# Patient Record
Sex: Female | Born: 1965 | Race: Black or African American | Hispanic: No | Smoking: Never smoker
Health system: Southern US, Community
[De-identification: ages and names within clinical notes are randomized; demographics above are authoritative.]

## PROBLEM LIST (undated history)

## (undated) DIAGNOSIS — E1169 Type 2 diabetes mellitus with other specified complication: Secondary | ICD-10-CM

## (undated) DIAGNOSIS — I4891 Unspecified atrial fibrillation: Secondary | ICD-10-CM

## (undated) DIAGNOSIS — K5900 Constipation, unspecified: Secondary | ICD-10-CM

## (undated) DIAGNOSIS — Z6841 Body Mass Index (BMI) 40.0 and over, adult: Secondary | ICD-10-CM

## (undated) DIAGNOSIS — E039 Hypothyroidism, unspecified: Secondary | ICD-10-CM

## (undated) DIAGNOSIS — M159 Polyosteoarthritis, unspecified: Secondary | ICD-10-CM

## (undated) DIAGNOSIS — M51369 Other intervertebral disc degeneration, lumbar region without mention of lumbar back pain or lower extremity pain: Secondary | ICD-10-CM

## (undated) DIAGNOSIS — G4730 Sleep apnea, unspecified: Secondary | ICD-10-CM

## (undated) DIAGNOSIS — R600 Localized edema: Secondary | ICD-10-CM

## (undated) DIAGNOSIS — K219 Gastro-esophageal reflux disease without esophagitis: Secondary | ICD-10-CM

## (undated) DIAGNOSIS — E785 Hyperlipidemia, unspecified: Secondary | ICD-10-CM

## (undated) DIAGNOSIS — E669 Obesity, unspecified: Secondary | ICD-10-CM

## (undated) DIAGNOSIS — J45909 Unspecified asthma, uncomplicated: Secondary | ICD-10-CM

## (undated) DIAGNOSIS — I10 Essential (primary) hypertension: Secondary | ICD-10-CM

## (undated) DIAGNOSIS — E876 Hypokalemia: Secondary | ICD-10-CM

## (undated) DIAGNOSIS — M5136 Other intervertebral disc degeneration, lumbar region: Secondary | ICD-10-CM

## (undated) HISTORY — DX: Gastro-esophageal reflux disease without esophagitis: K21.9

## (undated) HISTORY — DX: Essential (primary) hypertension: I10

## (undated) HISTORY — DX: Constipation, unspecified: K59.00

## (undated) HISTORY — DX: Hypomagnesemia: E83.42

## (undated) HISTORY — DX: Type 2 diabetes mellitus with other specified complication: E11.69

## (undated) HISTORY — DX: Hypokalemia: E87.6

## (undated) HISTORY — PX: OTHER SURGICAL HISTORY: SHX169

## (undated) HISTORY — DX: Morbid (severe) obesity due to excess calories: E66.01

## (undated) HISTORY — PX: VEIN SURGERY: SHX48

## (undated) HISTORY — DX: Body Mass Index (BMI) 40.0 and over, adult: Z684

## (undated) HISTORY — DX: Polyosteoarthritis, unspecified: M15.9

## (undated) HISTORY — PX: ABDOMINAL HYSTERECTOMY: SHX81

## (undated) HISTORY — DX: Type 2 diabetes mellitus with other specified complication: E66.9

## (undated) HISTORY — DX: Localized edema: R60.0

---

## 1998-12-31 ENCOUNTER — Encounter: Admission: RE | Admit: 1998-12-31 | Discharge: 1998-12-31 | Payer: Self-pay | Admitting: Family Medicine

## 1999-01-26 ENCOUNTER — Encounter: Admission: RE | Admit: 1999-01-26 | Discharge: 1999-01-26 | Payer: Self-pay | Admitting: Family Medicine

## 1999-04-02 ENCOUNTER — Encounter: Admission: RE | Admit: 1999-04-02 | Discharge: 1999-04-02 | Payer: Self-pay | Admitting: Family Medicine

## 1999-06-09 ENCOUNTER — Encounter: Payer: Self-pay | Admitting: Emergency Medicine

## 1999-06-09 ENCOUNTER — Emergency Department (HOSPITAL_COMMUNITY): Admission: EM | Admit: 1999-06-09 | Discharge: 1999-06-09 | Payer: Self-pay | Admitting: Emergency Medicine

## 1999-06-11 ENCOUNTER — Encounter: Admission: RE | Admit: 1999-06-11 | Discharge: 1999-06-11 | Payer: Self-pay | Admitting: Sports Medicine

## 1999-07-12 ENCOUNTER — Encounter: Admission: RE | Admit: 1999-07-12 | Discharge: 1999-07-12 | Payer: Self-pay | Admitting: Sports Medicine

## 1999-08-22 ENCOUNTER — Emergency Department (HOSPITAL_COMMUNITY): Admission: EM | Admit: 1999-08-22 | Discharge: 1999-08-22 | Payer: Self-pay | Admitting: Emergency Medicine

## 1999-12-16 ENCOUNTER — Emergency Department (HOSPITAL_COMMUNITY): Admission: EM | Admit: 1999-12-16 | Discharge: 1999-12-16 | Payer: Self-pay | Admitting: Emergency Medicine

## 1999-12-16 ENCOUNTER — Encounter: Payer: Self-pay | Admitting: Emergency Medicine

## 2000-03-03 ENCOUNTER — Encounter: Admission: RE | Admit: 2000-03-03 | Discharge: 2000-03-03 | Payer: Self-pay | Admitting: Family Medicine

## 2000-04-05 ENCOUNTER — Encounter: Admission: RE | Admit: 2000-04-05 | Discharge: 2000-04-05 | Payer: Self-pay | Admitting: Family Medicine

## 2000-04-10 ENCOUNTER — Encounter: Admission: RE | Admit: 2000-04-10 | Discharge: 2000-04-10 | Payer: Self-pay | Admitting: Family Medicine

## 2000-04-12 ENCOUNTER — Encounter: Payer: Self-pay | Admitting: Family Medicine

## 2000-04-12 ENCOUNTER — Encounter: Admission: RE | Admit: 2000-04-12 | Discharge: 2000-04-12 | Payer: Self-pay | Admitting: Family Medicine

## 2000-12-21 ENCOUNTER — Encounter: Admission: RE | Admit: 2000-12-21 | Discharge: 2000-12-21 | Payer: Self-pay | Admitting: Family Medicine

## 2001-06-04 ENCOUNTER — Encounter: Admission: RE | Admit: 2001-06-04 | Discharge: 2001-06-04 | Payer: Self-pay | Admitting: Sports Medicine

## 2001-06-11 ENCOUNTER — Emergency Department (HOSPITAL_COMMUNITY): Admission: EM | Admit: 2001-06-11 | Discharge: 2001-06-11 | Payer: Self-pay | Admitting: *Deleted

## 2001-06-25 ENCOUNTER — Emergency Department (HOSPITAL_COMMUNITY): Admission: EM | Admit: 2001-06-25 | Discharge: 2001-06-25 | Payer: Self-pay | Admitting: Emergency Medicine

## 2002-04-04 ENCOUNTER — Emergency Department (HOSPITAL_COMMUNITY): Admission: EM | Admit: 2002-04-04 | Discharge: 2002-04-04 | Payer: Self-pay | Admitting: Internal Medicine

## 2002-05-29 ENCOUNTER — Emergency Department (HOSPITAL_COMMUNITY): Admission: EM | Admit: 2002-05-29 | Discharge: 2002-05-29 | Payer: Self-pay | Admitting: Emergency Medicine

## 2002-06-04 ENCOUNTER — Emergency Department (HOSPITAL_COMMUNITY): Admission: EM | Admit: 2002-06-04 | Discharge: 2002-06-04 | Payer: Self-pay | Admitting: Emergency Medicine

## 2002-06-04 ENCOUNTER — Encounter: Payer: Self-pay | Admitting: Emergency Medicine

## 2002-10-20 ENCOUNTER — Encounter: Payer: Self-pay | Admitting: Emergency Medicine

## 2002-10-20 ENCOUNTER — Emergency Department (HOSPITAL_COMMUNITY): Admission: EM | Admit: 2002-10-20 | Discharge: 2002-10-20 | Payer: Self-pay | Admitting: Emergency Medicine

## 2003-10-15 ENCOUNTER — Emergency Department (HOSPITAL_COMMUNITY): Admission: EM | Admit: 2003-10-15 | Discharge: 2003-10-15 | Payer: Self-pay | Admitting: Emergency Medicine

## 2003-10-16 ENCOUNTER — Inpatient Hospital Stay (HOSPITAL_COMMUNITY): Admission: AD | Admit: 2003-10-16 | Discharge: 2003-10-18 | Payer: Self-pay | Admitting: Cardiology

## 2004-01-27 ENCOUNTER — Ambulatory Visit: Payer: Self-pay | Admitting: Nurse Practitioner

## 2004-02-12 ENCOUNTER — Ambulatory Visit: Payer: Self-pay | Admitting: *Deleted

## 2004-02-12 ENCOUNTER — Ambulatory Visit: Payer: Self-pay | Admitting: Nurse Practitioner

## 2004-03-03 ENCOUNTER — Ambulatory Visit: Payer: Self-pay | Admitting: Family Medicine

## 2004-03-05 ENCOUNTER — Encounter: Admission: RE | Admit: 2004-03-05 | Discharge: 2004-04-05 | Payer: Self-pay | Admitting: Family Medicine

## 2004-03-18 ENCOUNTER — Ambulatory Visit: Payer: Self-pay | Admitting: Family Medicine

## 2004-03-23 ENCOUNTER — Emergency Department (HOSPITAL_COMMUNITY): Admission: EM | Admit: 2004-03-23 | Discharge: 2004-03-23 | Payer: Self-pay | Admitting: Emergency Medicine

## 2004-03-26 ENCOUNTER — Emergency Department (HOSPITAL_COMMUNITY): Admission: EM | Admit: 2004-03-26 | Discharge: 2004-03-26 | Payer: Self-pay | Admitting: Emergency Medicine

## 2004-04-09 ENCOUNTER — Ambulatory Visit (HOSPITAL_COMMUNITY): Admission: RE | Admit: 2004-04-09 | Discharge: 2004-04-09 | Payer: Self-pay | Admitting: Obstetrics

## 2004-04-14 ENCOUNTER — Encounter (INDEPENDENT_AMBULATORY_CARE_PROVIDER_SITE_OTHER): Payer: Self-pay | Admitting: *Deleted

## 2004-04-22 ENCOUNTER — Ambulatory Visit: Payer: Self-pay | Admitting: Family Medicine

## 2004-06-14 ENCOUNTER — Ambulatory Visit: Payer: Self-pay | Admitting: Family Medicine

## 2004-07-15 ENCOUNTER — Ambulatory Visit: Payer: Self-pay | Admitting: Family Medicine

## 2004-08-01 ENCOUNTER — Ambulatory Visit (HOSPITAL_BASED_OUTPATIENT_CLINIC_OR_DEPARTMENT_OTHER): Admission: RE | Admit: 2004-08-01 | Discharge: 2004-08-01 | Payer: Self-pay | Admitting: Family Medicine

## 2004-08-06 ENCOUNTER — Encounter: Admission: RE | Admit: 2004-08-06 | Discharge: 2004-11-04 | Payer: Self-pay | Admitting: Sports Medicine

## 2004-08-08 ENCOUNTER — Ambulatory Visit: Payer: Self-pay | Admitting: Internal Medicine

## 2004-08-31 ENCOUNTER — Encounter: Admission: RE | Admit: 2004-08-31 | Discharge: 2004-08-31 | Payer: Self-pay | Admitting: Sports Medicine

## 2004-08-31 ENCOUNTER — Ambulatory Visit: Payer: Self-pay | Admitting: Family Medicine

## 2004-09-12 ENCOUNTER — Emergency Department (HOSPITAL_COMMUNITY): Admission: EM | Admit: 2004-09-12 | Discharge: 2004-09-13 | Payer: Self-pay | Admitting: Emergency Medicine

## 2004-10-06 ENCOUNTER — Ambulatory Visit: Payer: Self-pay | Admitting: Sports Medicine

## 2004-10-15 ENCOUNTER — Ambulatory Visit: Payer: Self-pay | Admitting: Sports Medicine

## 2004-11-11 ENCOUNTER — Ambulatory Visit: Payer: Self-pay | Admitting: Family Medicine

## 2004-11-17 ENCOUNTER — Emergency Department (HOSPITAL_COMMUNITY): Admission: EM | Admit: 2004-11-17 | Discharge: 2004-11-17 | Payer: Self-pay | Admitting: Emergency Medicine

## 2004-11-19 ENCOUNTER — Ambulatory Visit: Payer: Self-pay | Admitting: Family Medicine

## 2004-12-10 ENCOUNTER — Ambulatory Visit: Payer: Self-pay | Admitting: Family Medicine

## 2004-12-14 ENCOUNTER — Ambulatory Visit: Payer: Self-pay | Admitting: Family Medicine

## 2004-12-23 ENCOUNTER — Ambulatory Visit: Payer: Self-pay | Admitting: Sports Medicine

## 2004-12-29 ENCOUNTER — Encounter: Admission: RE | Admit: 2004-12-29 | Discharge: 2005-02-07 | Payer: Self-pay | Admitting: Family Medicine

## 2005-02-10 ENCOUNTER — Ambulatory Visit: Payer: Self-pay | Admitting: Sports Medicine

## 2005-03-03 ENCOUNTER — Ambulatory Visit: Payer: Self-pay | Admitting: Family Medicine

## 2005-05-13 ENCOUNTER — Ambulatory Visit: Payer: Self-pay | Admitting: Sports Medicine

## 2005-06-24 ENCOUNTER — Encounter (INDEPENDENT_AMBULATORY_CARE_PROVIDER_SITE_OTHER): Payer: Self-pay | Admitting: Specialist

## 2005-06-24 ENCOUNTER — Ambulatory Visit (HOSPITAL_COMMUNITY): Admission: RE | Admit: 2005-06-24 | Discharge: 2005-06-24 | Payer: Self-pay | Admitting: Obstetrics & Gynecology

## 2005-07-20 ENCOUNTER — Ambulatory Visit: Payer: Self-pay | Admitting: Sports Medicine

## 2005-08-10 ENCOUNTER — Ambulatory Visit: Payer: Self-pay | Admitting: Family Medicine

## 2005-08-24 ENCOUNTER — Ambulatory Visit: Payer: Self-pay | Admitting: Family Medicine

## 2005-11-09 ENCOUNTER — Ambulatory Visit: Payer: Self-pay | Admitting: Family Medicine

## 2006-01-31 ENCOUNTER — Ambulatory Visit: Payer: Self-pay | Admitting: Family Medicine

## 2006-02-01 ENCOUNTER — Ambulatory Visit: Payer: Self-pay | Admitting: Family Medicine

## 2006-02-01 ENCOUNTER — Encounter: Admission: RE | Admit: 2006-02-01 | Discharge: 2006-02-01 | Payer: Self-pay | Admitting: Family Medicine

## 2006-03-24 ENCOUNTER — Ambulatory Visit (HOSPITAL_COMMUNITY): Admission: RE | Admit: 2006-03-24 | Discharge: 2006-03-24 | Payer: Self-pay | Admitting: Family Medicine

## 2006-03-24 ENCOUNTER — Ambulatory Visit: Payer: Self-pay | Admitting: Family Medicine

## 2006-03-31 ENCOUNTER — Ambulatory Visit: Payer: Self-pay | Admitting: Family Medicine

## 2006-04-12 ENCOUNTER — Encounter: Admission: RE | Admit: 2006-04-12 | Discharge: 2006-04-12 | Payer: Self-pay | Admitting: Family Medicine

## 2006-04-12 ENCOUNTER — Ambulatory Visit: Payer: Self-pay | Admitting: Family Medicine

## 2006-04-12 ENCOUNTER — Encounter: Payer: Self-pay | Admitting: Family Medicine

## 2006-04-12 LAB — CONVERTED CEMR LAB
BUN: 13 mg/dL (ref 6–23)
Chloride: 104 meq/L (ref 96–112)
Potassium: 4 meq/L (ref 3.5–5.3)

## 2006-05-05 ENCOUNTER — Ambulatory Visit: Payer: Self-pay | Admitting: Family Medicine

## 2006-05-12 ENCOUNTER — Encounter (INDEPENDENT_AMBULATORY_CARE_PROVIDER_SITE_OTHER): Payer: Self-pay | Admitting: *Deleted

## 2006-07-05 ENCOUNTER — Telehealth: Payer: Self-pay | Admitting: *Deleted

## 2006-07-05 ENCOUNTER — Ambulatory Visit: Payer: Self-pay | Admitting: Sports Medicine

## 2006-07-05 DIAGNOSIS — J309 Allergic rhinitis, unspecified: Secondary | ICD-10-CM

## 2006-07-05 DIAGNOSIS — M549 Dorsalgia, unspecified: Secondary | ICD-10-CM

## 2006-07-05 DIAGNOSIS — M545 Low back pain, unspecified: Secondary | ICD-10-CM

## 2006-07-05 DIAGNOSIS — R300 Dysuria: Secondary | ICD-10-CM

## 2006-07-05 HISTORY — DX: Dorsalgia, unspecified: M54.9

## 2006-07-05 HISTORY — DX: Dysuria: R30.0

## 2006-07-05 HISTORY — DX: Allergic rhinitis, unspecified: J30.9

## 2006-07-05 HISTORY — DX: Low back pain, unspecified: M54.50

## 2006-07-05 LAB — CONVERTED CEMR LAB
Blood in Urine, dipstick: NEGATIVE
Glucose, Urine, Semiquant: NEGATIVE
Ketones, urine, test strip: NEGATIVE
Specific Gravity, Urine: 1.02
WBC Urine, dipstick: NEGATIVE
pH: 7.5

## 2007-01-19 ENCOUNTER — Emergency Department (HOSPITAL_COMMUNITY): Admission: EM | Admit: 2007-01-19 | Discharge: 2007-01-19 | Payer: Self-pay | Admitting: Family Medicine

## 2007-08-18 ENCOUNTER — Emergency Department (HOSPITAL_COMMUNITY): Admission: EM | Admit: 2007-08-18 | Discharge: 2007-08-19 | Payer: Self-pay | Admitting: Emergency Medicine

## 2008-10-14 ENCOUNTER — Emergency Department (HOSPITAL_COMMUNITY): Admission: EM | Admit: 2008-10-14 | Discharge: 2008-10-14 | Payer: Self-pay | Admitting: Emergency Medicine

## 2008-11-24 ENCOUNTER — Emergency Department (HOSPITAL_COMMUNITY): Admission: EM | Admit: 2008-11-24 | Discharge: 2008-11-24 | Payer: Self-pay | Admitting: Emergency Medicine

## 2009-06-13 ENCOUNTER — Emergency Department (HOSPITAL_COMMUNITY): Admission: EM | Admit: 2009-06-13 | Discharge: 2009-06-14 | Payer: Self-pay | Admitting: Emergency Medicine

## 2009-08-09 ENCOUNTER — Emergency Department (HOSPITAL_COMMUNITY): Admission: EM | Admit: 2009-08-09 | Discharge: 2009-08-09 | Payer: Self-pay | Admitting: Emergency Medicine

## 2009-09-09 ENCOUNTER — Ambulatory Visit (HOSPITAL_COMMUNITY): Admission: RE | Admit: 2009-09-09 | Discharge: 2009-09-09 | Payer: Self-pay | Admitting: Obstetrics & Gynecology

## 2009-09-16 ENCOUNTER — Emergency Department (HOSPITAL_COMMUNITY): Admission: EM | Admit: 2009-09-16 | Discharge: 2009-09-16 | Payer: Self-pay | Admitting: Family Medicine

## 2009-10-08 ENCOUNTER — Emergency Department (HOSPITAL_BASED_OUTPATIENT_CLINIC_OR_DEPARTMENT_OTHER): Admission: EM | Admit: 2009-10-08 | Discharge: 2009-10-08 | Payer: Self-pay | Admitting: Emergency Medicine

## 2009-11-11 ENCOUNTER — Emergency Department (HOSPITAL_BASED_OUTPATIENT_CLINIC_OR_DEPARTMENT_OTHER): Admission: EM | Admit: 2009-11-11 | Discharge: 2009-11-11 | Payer: Self-pay | Admitting: Emergency Medicine

## 2009-11-12 ENCOUNTER — Encounter: Admission: RE | Admit: 2009-11-12 | Discharge: 2009-11-12 | Payer: Self-pay | Admitting: Geriatric Medicine

## 2010-04-04 ENCOUNTER — Encounter: Payer: Self-pay | Admitting: Obstetrics & Gynecology

## 2010-04-04 ENCOUNTER — Encounter: Payer: Self-pay | Admitting: Neurology

## 2010-05-28 LAB — URINALYSIS, ROUTINE W REFLEX MICROSCOPIC
Bilirubin Urine: NEGATIVE
Nitrite: NEGATIVE
Specific Gravity, Urine: 1.017 (ref 1.005–1.030)
Urobilinogen, UA: 1 mg/dL (ref 0.0–1.0)

## 2010-05-29 LAB — BASIC METABOLIC PANEL
Calcium: 8.4 mg/dL (ref 8.4–10.5)
GFR calc non Af Amer: >60 mL/min (ref >60)
Glucose, Bld: 233 mg/dL — ABNORMAL HIGH (ref 70–99)
Sodium: 142 mEq/L (ref 135–145)

## 2010-05-29 LAB — URINALYSIS, ROUTINE W REFLEX MICROSCOPIC
Bilirubin Urine: NEGATIVE
Ketones, ur: NEGATIVE mg/dL
Nitrite: NEGATIVE
Protein, ur: NEGATIVE mg/dL
Specific Gravity, Urine: 1.018 (ref 1.005–1.030)
Urobilinogen, UA: 1 mg/dL (ref 0.0–1.0)

## 2010-05-30 LAB — POCT URINALYSIS DIP (DEVICE)
Bilirubin Urine: NEGATIVE
Nitrite: NEGATIVE
Protein, ur: NEGATIVE mg/dL
Urobilinogen, UA: 0.2 mg/dL (ref 0.0–1.0)
pH: 7 (ref 5.0–8.0)

## 2010-05-31 LAB — RAPID URINE DRUG SCREEN, HOSP PERFORMED
Amphetamines: NOT DETECTED
Benzodiazepines: NOT DETECTED
Cocaine: NOT DETECTED
Tetrahydrocannabinol: NOT DETECTED

## 2010-05-31 LAB — DIFFERENTIAL
Basophils Absolute: 0 10*3/uL (ref 0.0–0.1)
Lymphocytes Relative: 31 % (ref 12–46)
Lymphs Abs: 2 10*3/uL (ref 0.7–4.0)
Neutrophils Relative %: 61 % (ref 43–77)

## 2010-05-31 LAB — URINALYSIS, ROUTINE W REFLEX MICROSCOPIC
Glucose, UA: 100 mg/dL — AB
Nitrite: NEGATIVE
Protein, ur: NEGATIVE mg/dL
Urobilinogen, UA: 0.2 mg/dL (ref 0.0–1.0)

## 2010-05-31 LAB — POCT I-STAT, CHEM 8
BUN: 12 mg/dL (ref 6–23)
Chloride: 107 mEq/L (ref 96–112)
HCT: 37 % (ref 36.0–46.0)
Sodium: 140 mEq/L (ref 135–145)
TCO2: 25 mmol/L (ref 0–100)

## 2010-05-31 LAB — BRAIN NATRIURETIC PEPTIDE: Pro B Natriuretic peptide (BNP): <30 pg/mL (ref 0.0–100.0)

## 2010-05-31 LAB — CBC
Platelets: 176 10*3/uL (ref 150–400)
WBC: 6.4 10*3/uL (ref 4.0–10.5)

## 2010-05-31 LAB — URINE CULTURE

## 2010-06-02 LAB — DIFFERENTIAL
Eosinophils Relative: 2 % (ref 0–5)
Lymphocytes Relative: 27 % (ref 12–46)
Lymphs Abs: 1.8 10*3/uL (ref 0.7–4.0)
Neutro Abs: 4.2 10*3/uL (ref 1.7–7.7)

## 2010-06-02 LAB — RAPID STREP SCREEN (MED CTR MEBANE ONLY): Streptococcus, Group A Screen (Direct): NEGATIVE

## 2010-06-02 LAB — POCT I-STAT, CHEM 8
BUN: 10 mg/dL (ref 6–23)
Calcium, Ion: 1.18 mmol/L (ref 1.12–1.32)
Chloride: 102 mEq/L (ref 96–112)
HCT: 37 % (ref 36.0–46.0)
Potassium: 4.3 mEq/L (ref 3.5–5.1)

## 2010-06-02 LAB — CBC
HCT: 35.8 % — ABNORMAL LOW (ref 36.0–46.0)
Hemoglobin: 11.9 g/dL — ABNORMAL LOW (ref 12.0–15.0)
Platelets: 149 10*3/uL — ABNORMAL LOW (ref 150–400)
WBC: 6.6 10*3/uL (ref 4.0–10.5)

## 2010-06-02 LAB — MONONUCLEOSIS SCREEN: Mono Screen: NEGATIVE

## 2010-06-18 LAB — GLUCOSE, CAPILLARY: Glucose-Capillary: 155 mg/dL — ABNORMAL HIGH (ref 70–99)

## 2010-07-30 NOTE — Op Note (Signed)
Tonya Quinn, MESSLER                          ACCOUNT NO.:  [id number]   MEDICAL RECORD NO.:  [id number]                   PATIENT TYPE:  INP   LOCATION:  3731                                 FACILITY:  [facility]   PHYSICIAN:  Arturo Morton. Riley Kill, M.D. [facility]         DATE OF BIRTH:  03-Dec-1965   DATE OF PROCEDURE:  DATE OF DISCHARGE:  10/18/2003                                 OPERATIVE REPORT   NO DICTATION     Maple Mirza, P.A.                    Arturo Morton. Riley Kill, M.D. [facility]    GM/MEDQ  D:  10/18/2003  T:  10/18/2003  Job:  [id number]

## 2010-07-30 NOTE — Discharge Summary (Signed)
Tonya Quinn                          ACCOUNT NO.:  [id number]   MEDICAL RECORD NO.:  [id number]                   PATIENT TYPE:  INP   LOCATION:  3731                                 FACILITY:  [facility]   PHYSICIAN:  Arturo Morton. Riley Kill, M.D. [facility]         DATE OF BIRTH:  07/07/1965   DATE OF ADMISSION:  10/16/2003  DATE OF DISCHARGE:  10/18/2003                                 DISCHARGE SUMMARY   DISCHARGE DIAGNOSES:  1. Admitted with fatigue, dyspnea, weakness, exercise intolerance, chest     pain (serial evaluations in the emergency room both at [facility] and     [facility] prior to being sent to [facility]).  2. Echocardiogram, at [facility], October 16, 2003, is normal.  3. Electrocardiogram, on admission, nondiagnostic, sinus rhythm.  4. Exercise Cardiolite, at [facility], negative for ischemia or     infarct.  Ejection fraction 79%.  5. Internal medicine evaluation, October 17, 2003, the physician advised     against the patient's continued use of Advil which she has been taking     lately liberally for pain.  6. Continue with Tylenol for pain control.  7. Prescribed Prilosec, possible gastritis which has been causing     abdominal/chest pain.  8. The patient's personal seems stressed, depressed.  9. Referral to [facility] to rule out autoimmune disorder, prescribe     for depression.   PROCEDURE:  October 17, 2003, exercise Cardiolite study.  The patient has poor  exercise tolerance.  Exercised for a total of 8 minutes 35 seconds.  Achieved 8.1 MEETS, stopped because the patient was lightheaded, had fatigue  and dyspnea.  No chest pain.  The study showed an ejection fraction of 79%.  No infarct.  No ischemia.   Post exercise Cardiolite study, day #1 - October 18, 2003, the patient seemed  depressed, fatigued, said she continued to have a back discomfort and  abdominal and chest discomfort.  She did say she had stress at her job,  been  working long hours for little pay.  Has heavy financial burden.  She says  she has had prior behavioral health admission and that she has a history of  panic attacks and feels edgy.  The slightest intrusion causes upset to her.  Apparently giving a picture of someone who is not able to cope.  Contacted  the teaching service who recommended that she call [facility] at [phone] on Monday.  The patient will discharge, on October 18, 2003, with  recommendation to stop using Advil for pain and start using Tylenol and to  obtain Prilosec over the counter 20 mg twice daily for ten days and then to  start Prilosec over the counter daily.   DISCHARGE DIET:  Low-sodium, low-cholesterol.   FOLLOW UP:  Follow up with [facility], Monday, October 20, 2003.   BRIEF HISTORY:  [name] is a 45 year old female.  She has no  primary care physician.  Recently seen at [facility] in  [place] as well as [facility] Emergency Room on several  occasions at each place for evaluation of fatigue, weakness, dyspnea on  exertion, back pain, and chest pain.  The patient says that she was told  that her symptoms are due to stress.  Apparently the patient was told at the  [facility] Emergency Room that she had a murmur, and they recommended that  she be evaluated by [facility] with an echocardiogram.  The patient  came to the office of [facility], on October 16, 2003, an  echocardiogram was performed which was normal.  She told the technician that  she was having chest pain at the time and arrangements were made to work her  into the schedule.  She states that her pain comes on with stress.  She  describes the pain as left of the sternum, radiating to the back.  Unable to  describe the quality of the pain.  Severity is 3-5 on a 1 to 10 scale and  lasts about 60 seconds at a time, sometimes associated with shortness of  breath, occasional nausea,  occasional weakness.  There are no diaphoresis  symptoms.  It resolves spontaneously.  Apparently she woke up, October 11, 2003, feeling bad.  She states her back started to hurt as soon as her feet  hit the floor on October 11, 2003.  She has been having intermittent chest pain  since that time as well as dyspnea on exertion.  She was seen in the office  by Dr. Arturo Morton. Stuckey.  The decision was made to admit the patient to  [facility] for further evaluation.   HOSPITAL COURSE:  The patient was admitted to [facility] on October 16, 2003.  She was seen, on October 17, 2003, with resolution of chest pain.  Cardiac enzymes were negative x 2.  She underwent an exercise Cardiolite  study which was negative.  She was seen by internal medicine.  They queried  her use of medications and found that she had been using NSAIDs which would  quite likely produce gastritis that could be masquerading as chest pain.  They recommended she stop using Advil, start using Tylenol, and to take  Prilosec for the next 6-8 weeks.  She also is to follow up at the medical  clinic and we heartily concur.  The patient is to apply to [facility] on Monday.  She goes home with the medications and  followup as dictated.      Tonya Quinn, P.A.                    Arturo Morton. Riley Kill, M.D. [facility]    GM/MEDQ  D:  10/18/2003  T:  10/18/2003  Job:  [id number]   cc:   Arturo Morton. Riley Kill, M.D. [facility]   [facility]

## 2010-07-30 NOTE — Op Note (Signed)
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   PROCEDURE:  Diagnostic dilatation and curettage, hysteroscopy with NovaSure  endometrial ablation.   IV FLUIDS:  As per anesthesiology.   URINE OUTPUT:  100 mL clear urine at the beginning of the procedure.   COMPLICATIONS:  None.   ESTIMATED BLOOD LOSS:  Minimal.   PROCEDURE:  The patient was taken to the operating room with an IV running.  She was placed in the dorsal lithotomy position and prepped and draped in  the usual sterile fashion.  Sims retractors were placed into the vagina.  The anterior lip of the cervix was then infiltrated with 1% lidocaine, 2 mL.  A single-tooth tenaculum was applied to this location.  4 mL of 1% lidocaine  were then injected at 5 and 7 o'clock to produce a paracervical block.  The  uterus was then sounded to 11 cm.  The length of the cervical canal was felt  to be 5.5 cm and this was determined by inserting a Hegar dilator to the  level of the internal os.  The cervix was then dilated with [facility] dilators.  The hysteroscope was then advanced into the uterine fundus.  A survey of the  intrauterine cavity was essentially normal.  Of note, there was a suggestion  of a small myoma in the anterior right fundal region.  A sharp curettage was  then performed.  The NovaSure apparatus was then advanced into the fundus.  The cavity integrity test was passed and the ablation cycle was started and  completed.  The apparatus was  then removed from the uterus.  The single  tooth tenaculum was then removed with minimal bleeding noted from the  cervix.  At the close of the procedure, the instrument and pack counts were  said to be correct x2.  The patient was taken to the PACU awake and in  stable condition.   PATHOLOGY:  Endometrial curettings.      Roseanna Rainbow, M.D.  Electronically Signed     LAJ/MEDQ  D:  06/24/2005  T:  06/24/2005  Job:  [id number]

## 2010-07-30 NOTE — H&P (Signed)
Tonya Quinn, Tonya Quinn                          ACCOUNT NO.:  [id number]   MEDICAL RECORD NO.:  [id number]                   PATIENT TYPE:  INP   LOCATION:  3740                                 FACILITY:  [facility]   PHYSICIAN:  Arturo Morton. Riley Kill, M.D. [facility]         DATE OF BIRTH:  1966/01/24   DATE OF ADMISSION:  10/16/2003  DATE OF DISCHARGE:                                HISTORY & PHYSICAL   CHIEF COMPLAINT:  Chest pain and dyspnea on exertion.   HISTORY OF PRESENT ILLNESS:  This is a 45 year old female who has no primary  care physician.  She was recently seen at the [facility] in [place],  [place], as well as the [facility]  Emergency Room on several occasions for evaluation of extreme fatigue,  weakness, dyspnea on exertion, back pain, and chest pain.  The patient tells  me that she was told that her symptoms were due to stress.  We do not have  the records from the hospital in [place], at this time.   Apparently the patient was told at the [facility] Emergency Room  that she had a murmur on exam and they recommended that she be evaluated by  [name] with an echocardiogram.  The patient came to our office  today to have an echocardiogram performed.  The echocardiogram was normal.  However, she told the technician that she was having chest pain and  arrangements were made to work her into the schedule.   The patient states that her pain comes on with stress.  She describes the  pain as to the left of her sternum and radiating through to her back.  She  is unable to describe the quality of the pain.  The severity is 3-5 on a 1-  10 scale and lasts approximately 60 seconds at a time.  Sometimes it is  associated with shortness of breath, occasional nausea, and occasional  weakness.  There is no diaphoresis.  The symptoms resolve on their own.  She  apparently woke up on October 11, 2003, feeling bad.  She  states her back  started to hurt as soon as her feet hit the floor.  She has been having  intermittent chest pain since that time, as well as dyspnea on exertion.  She was seen in our office today by Arturo Morton. Riley Kill, M.D., and a decision  was made to admit the patient for further evaluation.   PAST MEDICAL HISTORY:  Negative for diabetes.  Negative for hypertension.  The patient states that she has had her cholesterol checked in the distant  past and it was normal.  She has never been a smoker.  She does have a  remote history of asthma, however, this has not been a problem recently.  She is status post tubal ligation.  She is status post childbirth x 4.  She  has  a history of varicose vein surgery.   ALLERGIES:  GUAIFENEX causes her to have a headache.   CURRENT MEDICATIONS:  Ibuprofen p.r.n. only.   SOCIAL HISTORY:  The patient lives in [place], [place], with her  son.  She is single.  She has four children.  She has never used alcohol or  tobacco.  She works as a Education administrator with the mentally  challenged.  She has not been to work since October 11, 2003, due to the above  symptoms.   FAMILY HISTORY:  The patient's father is in his 50s.  He is alive and well.  Her mother died at an early age.  Apparently she was murdered.  She has two  brothers who are alive and well.   REVIEW OF SYSTEMS:  Negative, except for the following:  She has had the  above-noted weakness and fatigue.  She has had a 10-20-pound weight gain  over the past three months.  She has had frequent headaches.  She has  dyspnea on exertion.  She has had a cough with clear white sputum.  She has  had the above-noted chest pain.  She has mild ankle edema.  Although this is  a chronic problem, she states it has been worse over the past two months.  She has had some nausea and some diarrhea.  She has occasional joint pain.  She has had low back pain, cold intolerance, as well as hot flashes.   She  had a recent urinary tract infection, which she states has cleared up.  She  admits to mild depression and mild anxiety.   PHYSICAL EXAMINATION:  GENERAL APPEARANCE:  A pleasant, fatigued, 38-year-  old, black female in no acute distress.  VITAL SIGNS:  Blood pressure 111/73, pulse 73.  WEIGHT:  282 pounds.  HEENT:  Unremarkable.  NECK:  No bruits.  No jugular venous distention.  HEART:  Regular rate and rhythm without murmur.  LUNGS:  Clear.  ABDOMEN:  Obese, soft, and nontender.  EXTREMITIES:  Pulses intact without significant edema.  SKIN:  Warm and dry.  NEUROLOGIC:  Exam is grossly intact.   LABORATORY DATA:  An EKG shows normal sinus rhythm and rate 73 beats per  minute without ischemia.  A 2-D echocardiogram performed in the office today  was within normal limits.  The left ventricle was normal size with a normal  ejection fraction and no significant valvular abnormalities.   IMPRESSION:  1. Dyspnea on exertion and chest pain.  2. Anxiety and depression.  3. Obesity.  4. Normal 2-D echocardiogram performed today.  5. Status post tubal ligation.   PLAN:  After discussing the patient with Dr. Riley Kill, a decision was made to  admit the patient for further evaluation.  We planned to order a D-dimer, as  well as an exercise Cardiolite.  If the source of her problems is not found  with the above-noted studies, we will ask the teaching service to evaluate  this patient.  We have instructed her to find a primary care physician for  further evaluation of her multiple medical issues.      Delton See, P.A. [facility]                  Thomas D. Riley Kill, M.D. [facility]    DR/MEDQ  D:  10/16/2003  T:  10/16/2003  Job:  [id number]

## 2010-07-30 NOTE — Procedures (Signed)
Tonya Quinn, AMSTER                ACCOUNT NO.:  [id number]   MEDICAL RECORD NO.:  [id number]          PATIENT TYPE:  REC   LOCATION:  SLEEP CENTER                 FACILITY:  [facility]   PHYSICIAN:  Clinton D. Maple Hudson, M.D. DATE OF BIRTH:  1965-07-07   DATE OF STUDY:  08/06/2004                              NOCTURNAL POLYSOMNOGRAM   REFERRING PHYSICIAN:  Nani Gasser, MD   INDICATION FOR STUDY:  Hypersomnia with sleep apnea.   EPWORTH SLEEPINESS SCORE:  Epworth Sleepiness Score 18/24, BMI 44, weight  276 pounds.   SLEEP ARCHITECTURE:  Total sleep time 281 minutes with sleep efficiency 73%.  Stage I was 7%, stage 2 66%, stages 3 and 4 18%, REMARKABLE was 9% of total  sleep time. Sleep latency 22 minutes. REMARKABLE latency 167 minutes, awake  after sleep onset 84 minutes, arousal index 29.   RESPIRATORY DATA:  Respiratory disturbance index (RDI, AHI) 1.9 obstructive  events per hour which is within normal limits. There were 1 central apnea, 3  obstructive apneas, and 5 hypopnea's. Events were not positional. REM RDI  2.4.   OXYGEN DATA:  Moderate snoring with oxygen desaturations with a nadir of  91%. Mean oxygen saturation on room air through this study was 96%.   CARDIAC DATA:  Normal sinus rhythm.   MOVEMENT/PARASOMNIA:  Occasional leg jerk with arousal, insignificant. There  were frequent nonspecific EEG arousals.   IMPRESSION/RECOMMENDATION:  1.  Occasional sleep disorder breathing events, within normal frequency      limits with an RDI of 1.9 per hour.  2.  Nonspecific EEG arousals which may contribute to a sense of non      restorative sleep the next day, potentially treatable as insomnia.      Clinton D. Maple Hudson, M.D.  [employer]    CDY/MEDQ  D:  08/08/2004 14:25:21  T:  08/08/2004 15:12:24  Job:  [id number]

## 2010-12-09 LAB — URINALYSIS, ROUTINE W REFLEX MICROSCOPIC
Nitrite: NEGATIVE
Protein, ur: NEGATIVE
Specific Gravity, Urine: 1.028
Urobilinogen, UA: 1

## 2010-12-09 LAB — D-DIMER, QUANTITATIVE: D-Dimer, Quant: 0.41

## 2011-09-11 ENCOUNTER — Emergency Department (HOSPITAL_COMMUNITY): Payer: BC Managed Care – PPO

## 2011-09-11 ENCOUNTER — Encounter (HOSPITAL_COMMUNITY): Payer: Self-pay | Admitting: Emergency Medicine

## 2011-09-11 ENCOUNTER — Emergency Department (HOSPITAL_COMMUNITY)
Admission: EM | Admit: 2011-09-11 | Discharge: 2011-09-11 | Disposition: A | Discharge status: Home / Self Care | Payer: BC Managed Care – PPO | Attending: Emergency Medicine | Admitting: Emergency Medicine

## 2011-09-11 DIAGNOSIS — R109 Unspecified abdominal pain: Secondary | ICD-10-CM | POA: Insufficient documentation

## 2011-09-11 DIAGNOSIS — M545 Low back pain, unspecified: Secondary | ICD-10-CM | POA: Insufficient documentation

## 2011-09-11 DIAGNOSIS — I10 Essential (primary) hypertension: Secondary | ICD-10-CM | POA: Insufficient documentation

## 2011-09-11 DIAGNOSIS — N390 Urinary tract infection, site not specified: Secondary | ICD-10-CM | POA: Insufficient documentation

## 2011-09-11 DIAGNOSIS — D259 Leiomyoma of uterus, unspecified: Secondary | ICD-10-CM | POA: Insufficient documentation

## 2011-09-11 DIAGNOSIS — E119 Type 2 diabetes mellitus without complications: Secondary | ICD-10-CM | POA: Insufficient documentation

## 2011-09-11 HISTORY — DX: Essential (primary) hypertension: I10

## 2011-09-11 HISTORY — DX: Unspecified asthma, uncomplicated: J45.909

## 2011-09-11 LAB — URINE MICROSCOPIC-ADD ON

## 2011-09-11 LAB — URINALYSIS, ROUTINE W REFLEX MICROSCOPIC
Bilirubin Urine: NEGATIVE
Ketones, ur: NEGATIVE mg/dL
Nitrite: NEGATIVE
Protein, ur: 30 mg/dL — AB

## 2011-09-11 LAB — BASIC METABOLIC PANEL
BUN: 8 mg/dL (ref 6–23)
Calcium: 9.4 mg/dL (ref 8.4–10.5)
GFR calc Af Amer: >90 mL/min (ref >90)
GFR calc non Af Amer: >90 mL/min (ref >90)
Glucose, Bld: 178 mg/dL — ABNORMAL HIGH (ref 70–99)
Potassium: 3.5 mEq/L (ref 3.5–5.1)
Sodium: 137 mEq/L (ref 135–145)

## 2011-09-11 LAB — CBC
Hemoglobin: 12.2 g/dL (ref 12.0–15.0)
MCH: 27.9 pg (ref 26.0–34.0)
MCHC: 32.5 g/dL (ref 30.0–36.0)
RDW: 14 % (ref 11.5–15.5)

## 2011-09-11 MED ORDER — CYCLOBENZAPRINE HCL 5 MG PO TABS: 5.0000 mg | ORAL_TABLET | Freq: Three times a day (TID) | ORAL | Status: AC | PRN

## 2011-09-11 MED ORDER — OXYCODONE-ACETAMINOPHEN 5-325 MG PO TABS: 1.0000 | ORAL_TABLET | Freq: Four times a day (QID) | ORAL | Status: AC | PRN

## 2011-09-11 MED ORDER — MORPHINE SULFATE 4 MG/ML IJ SOLN
6.0000 mg | Freq: Once | INTRAMUSCULAR | Status: AC
  Administered 2011-09-11: 6 mg via INTRAMUSCULAR
  Filled 2011-09-11: qty 2

## 2011-09-11 MED ORDER — ONDANSETRON 4 MG PO TBDP
8.0000 mg | ORAL_TABLET | ORAL | Status: AC
  Administered 2011-09-11: 8 mg via ORAL
  Filled 2011-09-11: qty 2

## 2011-09-11 MED ORDER — CIPROFLOXACIN HCL 500 MG PO TABS
500.0000 mg | ORAL_TABLET | Freq: Once | ORAL | Status: AC
  Administered 2011-09-11: 500 mg via ORAL
  Filled 2011-09-11: qty 1

## 2011-09-11 MED ORDER — CIPROFLOXACIN HCL 500 MG PO TABS: 500.0000 mg | ORAL_TABLET | Freq: Two times a day (BID) | ORAL | Status: AC

## 2011-09-11 NOTE — Discharge Instructions (Signed)
Return to the ED with any concerns including vomiting and not able to keep down antibiotics or liquids, worsening pain, weakness in legs, fever/chills, decreased level of alertness/lethargy, or any other alarming symptoms

## 2011-09-11 NOTE — ED Provider Notes (Signed)
History     CSN: [id number]  Arrival date & time 09/11/11  1539   First MD Initiated Contact with Patient 09/11/11 1751      Chief Complaint  Patient presents with  . Flank Pain  . Abdominal Pain    (Consider location/radiation/quality/duration/timing/severity/associated sxs/prior treatment) HPI Pt presents with c/o right sided low back/flank pain.  She states pain radiates around right side.  Pain began this morning.  Worse with movement and palpation.  Mild nausea, no vomiting.  No fever/chills.  No falls or trauma.  No weakness of legs, no retention of urine or incontinence of bowel or bladder.  There are no other associated systemic symtpoms, there are no other alleviating or modifying factors.  Pt has not taken any meds for her pain.  No dysuria.  Past Medical History  Diagnosis Date  . Diabetes mellitus   . Hypertension   . Asthma     History reviewed. No pertinent past surgical history.  No family history on file.  History  Substance Use Topics  . Smoking status: Never Smoker   . Smokeless tobacco: Not on file  . Alcohol Use: No    OB History    Grav Para Term Preterm Abortions TAB SAB Ect Mult Living                  Review of Systems ROS reviewed and all otherwise negative except for mentioned in HPI  Allergies  Review of patient's allergies indicates no known allergies.  Home Medications   Current Outpatient Rx  Name Route Sig Dispense Refill  . ACETAMINOPHEN 500 MG PO TABS Oral Take 500-1,000 mg by mouth 2 (two) times daily.    Marland Kitchen LISINOPRIL 10 MG PO TABS Oral Take 10 mg by mouth daily.    Marland Kitchen METFORMIN HCL 500 MG PO TABS Oral Take 500 mg by mouth 2 (two) times daily.    Marland Kitchen CIPROFLOXACIN HCL 500 MG PO TABS Oral Take 1 tablet (500 mg total) by mouth every 12 (twelve) hours. 14 tablet 0  . CYCLOBENZAPRINE HCL 5 MG PO TABS Oral Take 1 tablet (5 mg total) by mouth 3 (three) times daily as needed for muscle spasms. 20 tablet 0  . OXYCODONE-ACETAMINOPHEN  5-325 MG PO TABS Oral Take 1-2 tablets by mouth every 6 (six) hours as needed for pain. 15 tablet 0    BP 143/92  Pulse 86  Temp 98.8 F (37.1 C) (Oral)  Resp 20  SpO2 97%  LMP 09/04/2011 Vitals reviewed Physical Exam Physical Examination: General appearance - alert, uncomfortable appearing, and in no distress Mental status - alert, oriented to person, place, and time Mouth - mucous membranes moist, pharynx normal without lesions Neck - supple, no significant adenopathy Chest - clear to auscultation, no wheezes, rales or rhonchi, symmetric air entry Heart - normal rate, regular rhythm, normal S1, S2, no murmurs, rubs, clicks or gallops Abdomen - soft, nontender, nondistended, no masses or organomegaly Back exam - no midline tenderness to palpation, mild right paraspinal tenderness/right CVA tenderness Neurological - alert, oriented, normal speech, strength 5/5 in extremities x 4, sensation intact, gait normal Musculoskeletal - no joint tenderness, deformity or swelling Extremities - peripheral pulses normal, no pedal edema, no clubbing or cyanosis Skin - normal coloration and turgor, no rashes  ED Course  Procedures (including critical care time)  Labs Reviewed  URINALYSIS, ROUTINE W REFLEX MICROSCOPIC - Abnormal; Notable for the following:    APPearance CLOUDY (*)     Glucose,  UA 100 (*)     Protein, ur 30 (*)     Leukocytes, UA TRACE (*)     All other components within normal limits  BASIC METABOLIC PANEL - Abnormal; Notable for the following:    Glucose, Bld 178 (*)     All other components within normal limits  URINE MICROSCOPIC-ADD ON - Abnormal; Notable for the following:    Squamous Epithelial / LPF FEW (*)     Bacteria, UA MANY (*)     Crystals CA OXALATE CRYSTALS (*)     All other components within normal limits  CBC  POCT PREGNANCY, URINE  URINE CULTURE   Ct Abdomen Pelvis Wo Contrast  09/11/2011  *RADIOLOGY REPORT*  Clinical Data: Right flank pain.  CT  ABDOMEN AND PELVIS WITHOUT CONTRAST  Technique:  Multidetector CT imaging of the abdomen and pelvis was performed following the standard protocol without intravenous contrast.  Comparison: None.  Findings: Mild hepatomegaly noted.  The spleen, pancreas, and adrenal glands appear normal.  The kidneys appear unremarkable, as do the proximal ureters. The gallbladder appears contracted but otherwise unremarkable.  Scattered vascular calcifications noted in the pelvis.  No ureteral calculus is observed.  Small retroperitoneal lymph nodes measure up to 0.9 cm in short axis, in the upper limits of normal.  Small pelvic lymph nodes are not pathologically enlarged by size criteria.  Fatty prominence of the ileocecal valve noted.  A structure believed represent the appendix extends above the right ovary and does not appear inflamed.  An exophytic mass in the left fundal region of the uterus measures 5.2 x 7.4 by 8.0 cm.  A subserosal fibroid was described in this vicinity on prior ultrasound although was smaller.  Uterine contour suggests additional uterine fibroids. The left ovary is adjacent to this presumed fibroid.  Urinary bladder unremarkable.  No free pelvic fluid noted.  IMPRESSION:  1.  Mild hepatomegaly.  A specific cause for the patient's right flank pain is not observed. 2.  Uterine fibroids.  A left fundal exophytic fibroid has increased in size compared to prior pelvic ultrasound of 2011.  Original Report Authenticated By: Dellia Cloud, M.D.     1. Low back pain   2. Urinary tract infection       MDM  CT scan without evidence of ureteral stone, pt with pain that is c/w musculoskeletal pain- no signs or symptoms of cauda equina, also urine c/w UTI.  Started on antibiotics, also treated with pain meds and muscle relaxant.  Discharged with strict return precautions.  She is agreeable with this plan.         Ethelda Chick, MD 09/12/11 2153

## 2011-09-11 NOTE — ED Notes (Signed)
R flank pain that radiates to R abd since 9:30am.   Reports mild nausea.

## 2011-09-13 LAB — URINE CULTURE: Colony Count: 80000

## 2012-02-20 ENCOUNTER — Encounter (HOSPITAL_COMMUNITY): Payer: Self-pay | Admitting: Pharmacist

## 2012-02-23 ENCOUNTER — Encounter (HOSPITAL_COMMUNITY)
Admission: RE | Admit: 2012-02-23 | Discharge: 2012-02-23 | Disposition: A | Discharge status: Home / Self Care | Payer: BC Managed Care – PPO | Source: Ambulatory Visit | Attending: Obstetrics and Gynecology | Admitting: Obstetrics and Gynecology

## 2012-02-23 ENCOUNTER — Inpatient Hospital Stay (HOSPITAL_COMMUNITY): Payer: BC Managed Care – PPO

## 2012-02-23 ENCOUNTER — Inpatient Hospital Stay (HOSPITAL_COMMUNITY)
Admission: AD | Admit: 2012-02-23 | Discharge: 2012-02-23 | Disposition: A | Discharge status: Home / Self Care | Payer: BC Managed Care – PPO | Source: Ambulatory Visit | Attending: Emergency Medicine | Admitting: Emergency Medicine

## 2012-02-23 DIAGNOSIS — R0989 Other specified symptoms and signs involving the circulatory and respiratory systems: Secondary | ICD-10-CM | POA: Insufficient documentation

## 2012-02-23 DIAGNOSIS — Z01818 Encounter for other preprocedural examination: Secondary | ICD-10-CM | POA: Insufficient documentation

## 2012-02-23 DIAGNOSIS — Z01812 Encounter for preprocedural laboratory examination: Secondary | ICD-10-CM | POA: Insufficient documentation

## 2012-02-23 DIAGNOSIS — J45909 Unspecified asthma, uncomplicated: Secondary | ICD-10-CM | POA: Insufficient documentation

## 2012-02-23 DIAGNOSIS — R0609 Other forms of dyspnea: Secondary | ICD-10-CM | POA: Insufficient documentation

## 2012-02-23 DIAGNOSIS — E119 Type 2 diabetes mellitus without complications: Secondary | ICD-10-CM | POA: Insufficient documentation

## 2012-02-23 DIAGNOSIS — Z79899 Other long term (current) drug therapy: Secondary | ICD-10-CM | POA: Insufficient documentation

## 2012-02-23 DIAGNOSIS — N898 Other specified noninflammatory disorders of vagina: Secondary | ICD-10-CM | POA: Insufficient documentation

## 2012-02-23 DIAGNOSIS — R079 Chest pain, unspecified: Secondary | ICD-10-CM | POA: Insufficient documentation

## 2012-02-23 DIAGNOSIS — I10 Essential (primary) hypertension: Secondary | ICD-10-CM | POA: Insufficient documentation

## 2012-02-23 DIAGNOSIS — N949 Unspecified condition associated with female genital organs and menstrual cycle: Secondary | ICD-10-CM | POA: Insufficient documentation

## 2012-02-23 LAB — CBC
HCT: 38.1 % (ref 36.0–46.0)
MCH: 28.6 pg (ref 26.0–34.0)
Platelets: 168 10*3/uL (ref 150–400)
Platelets: 174 10*3/uL (ref 150–400)
RBC: 4.3 MIL/uL (ref 3.87–5.11)
RDW: 13.6 % (ref 11.5–15.5)
WBC: 5.4 10*3/uL (ref 4.0–10.5)
WBC: 5.3 10*3/uL (ref 4.0–10.5)

## 2012-02-23 LAB — COMPREHENSIVE METABOLIC PANEL
AST: 14 U/L (ref 0–37)
Albumin: 3.8 g/dL (ref 3.5–5.2)
Alkaline Phosphatase: 98 U/L (ref 39–117)
BUN: 8 mg/dL (ref 6–23)
CO2: 27 mEq/L (ref 19–32)
Chloride: 98 mEq/L (ref 96–112)
Chloride: 100 mEq/L (ref 96–112)
Creatinine, Ser: 0.57 mg/dL (ref 0.50–1.10)
GFR calc non Af Amer: >90 mL/min (ref >90)
Potassium: 3.5 mEq/L (ref 3.5–5.1)
Total Bilirubin: 0.3 mg/dL (ref 0.3–1.2)
Total Bilirubin: 0.4 mg/dL (ref 0.3–1.2)

## 2012-02-23 LAB — TROPONIN I: Troponin I: <0.3 ng/mL (ref <0.30)

## 2012-02-23 NOTE — MAU Note (Signed)
Pt was being seen in pre-op began having chest pain while she was there.  Pt states she has hx of chest pain, has never had cardiac dx.  Pt was feeling SOB, pain is L side of chest, pt denies radiating pain to arms or neck, but does have upper back pain.

## 2012-02-23 NOTE — MAU Note (Signed)
[facility] here 

## 2012-02-23 NOTE — ED Provider Notes (Signed)
History     CSN: [id number]  Arrival date & time 02/23/12  1142   First MD Initiated Contact with Patient 02/23/12 1236      Chief Complaint  Patient presents with  . Chest Pain    (Consider location/radiation/quality/duration/timing/severity/associated sxs/prior treatment) HPI The patient presents from [facility] hospital where she was receiving preoperative clearance for a planned hysterectomy.  Today, approximately 2 hours ago she developed anterior chest discomfort.  She states the pain began after receiving some concerning news about a family member.  The pain was diffuse, anterior, nonradiating, nonexertional nonpleuritic.  She notes mild associated dyspnea.  Symptoms improved with nitroglycerin, aspirin on ED arrival the patient is entirely asymptomatic.  Simply requesting that we do not cancel her hysterectomy. She denies a history of heart disease, smoking. She does have a history of hypertension, as well as non-insulin-dependent diabetes.  I spoke with the care provider at [facility] hospital regarding the patient's presentation at that time.  Past Medical History  Diagnosis Date  . Diabetes mellitus   . Hypertension   . Asthma     No past surgical history on file.  No family history on file.  History  Substance Use Topics  . Smoking status: Never Smoker   . Smokeless tobacco: Not on file  . Alcohol Use: No    OB History    Grav Para Term Preterm Abortions TAB SAB Ect Mult Living                  Review of Systems  Constitutional:       Per HPI, otherwise negative  HENT:       Per HPI, otherwise negative  Eyes: Negative.   Respiratory:       Per HPI, otherwise negative  Cardiovascular:       Per HPI, otherwise negative  Gastrointestinal: Negative for vomiting.  Genitourinary: Positive for vaginal bleeding, vaginal pain and pelvic pain.  Musculoskeletal:       Per HPI, otherwise negative  Skin: Negative.   Neurological: Negative for syncope.     Allergies  Review of patient's allergies indicates no known allergies.  Home Medications   Current Outpatient Rx  Name  Route  Sig  Dispense  Refill  . ALPRAZOLAM 0.25 MG PO TABS   Oral   Take 0.25 mg by mouth at bedtime as needed. For anxiety         . AMOXICILLIN 500 MG PO CAPS   Oral   Take 1,000 mg by mouth 2 (two) times daily. Only took 1 tablet twice daily initially. Pt is finishing out treatment.         . BUDESONIDE-FORMOTEROL FUMARATE 160-4.5 MCG/ACT IN AERO   Inhalation   Inhale 2 puffs into the lungs 2 (two) times daily as needed. For shortness of breath         . VITAMIN D PO   Oral   Take 1 tablet by mouth daily.         . CYCLOBENZAPRINE HCL 10 MG PO TABS   Oral   Take 10 mg by mouth at bedtime as needed. For back spasm         . DICLOFENAC SODIUM 75 MG PO TBEC   Oral   Take 75 mg by mouth 2 (two) times daily as needed. For pain         . LISINOPRIL 20 MG PO TABS   Oral   Take 20 mg by mouth daily.         Marland Kitchen  METFORMIN HCL 500 MG PO TABS   Oral   Take 500 mg by mouth 2 (two) times daily.         Marland Kitchen NAPROXEN SODIUM 550 MG PO TABS   Oral   Take 550 mg by mouth 2 (two) times daily as needed. For pain. Does not take while taking diclofenac.         . NORETHINDRONE 0.35 MG PO TABS   Oral   Take 1 tablet by mouth daily.           BP 129/58  Pulse 78  Temp 98.5 F (36.9 C) (Oral)  Resp 20  SpO2 99%  LMP 02/16/2012  Physical Exam  Nursing note and vitals reviewed. Constitutional: She is oriented to person, place, and time. She appears well-developed and well-nourished. No distress.  HENT:  Head: Normocephalic and atraumatic.  Eyes: Conjunctivae normal and EOM are normal.  Cardiovascular: Normal rate and regular rhythm.   Pulmonary/Chest: Effort normal and breath sounds normal. No stridor. No respiratory distress.  Abdominal: She exhibits no distension.  Musculoskeletal: She exhibits no edema.  Neurological: She is alert  and oriented to person, place, and time. No cranial nerve deficit.  Skin: Skin is warm and dry.  Psychiatric: She has a normal mood and affect.    ED Course  Procedures (including critical care time)   Labs Reviewed  COMPREHENSIVE METABOLIC PANEL  CBC  TROPONIN I   No results found.   1. Chest pain     Cardiac 80 sinus rhythm normal Pulse ox 99% room air normal   Date: 02/23/2012  Rate: 77  Rhythm: normal sinus rhythm  QRS Axis: normal  Intervals: normal  ST/T Wave abnormalities: nonspecific T wave changes  Conduction Disutrbances:none  Narrative Interpretation:   Old EKG Reviewed: none available BORDERLINE  MDM  [facility], after she developed chest pain while in the preoperative clearance evaluation.  On arrival to the emergency department the patient is chest pain-free.  The patient's endorsement of symptoms began after receiving troubling news, her absence of risk factors beyond hypertension , diabetes is reassuring.  The patient's ECG was nonischemic, her labs reassuring, and after a discussion on the need for continued outpatient evaluation, the patient discharged.  Given that she already had a stress test, this episode likely represents stress event, with low suspicion for acute ongoing coronary ischemia.        Gerhard Munch, MD 02/23/12 [phone]

## 2012-02-23 NOTE — MAU Provider Note (Signed)
Pt was in pre-op with anesthesia and started having chest pain. Pt had abnormal EKG.  Pt's BP was 150/90 .  Anesthesiologist talked to [facility] ED physician (? Name) Will accept pt; pt will be transferred by [facility]

## 2012-02-23 NOTE — ED Notes (Signed)
Patient transported to X-ray 

## 2012-02-23 NOTE — ED Notes (Signed)
Pt reports chest pressure across anterior chest beginning around 11am this AM after receiving some bad news from family member. Pt states at the time she became SOB with the pressure but has now resolved. Pt received 2 nitro SL with total pain relief and 324mg  ASA. 20g RAC. CBG 188. Stress test approx 1 mo ago was normal.

## 2012-02-23 NOTE — Pre-Procedure Instructions (Signed)
Pt c/o chest pain/pressure during PAT appt. BP of 171/112. Report given to Brayton Caves, MD, no anes. Interview. Order given to send pt to [facility] ED for evaluation. Arrangements made by house coverage Florina Ou). Pt taken to MAU via wheelchair per instructions until arrival of [facility] for transport.

## 2012-02-24 MED FILL — Aspirin Chew Tab 81 MG: ORAL | Qty: 4 | Status: AC

## 2012-02-28 ENCOUNTER — Other Ambulatory Visit (HOSPITAL_COMMUNITY): Payer: BC Managed Care – PPO

## 2012-02-28 ENCOUNTER — Encounter (HOSPITAL_COMMUNITY)
Admission: RE | Admit: 2012-02-28 | Discharge: 2012-02-28 | Disposition: A | Discharge status: Home / Self Care | Payer: BC Managed Care – PPO | Source: Ambulatory Visit | Attending: Obstetrics and Gynecology | Admitting: Obstetrics and Gynecology

## 2012-02-28 ENCOUNTER — Encounter (HOSPITAL_COMMUNITY): Payer: Self-pay

## 2012-02-28 MED ORDER — CEFAZOLIN SODIUM 10 G IJ SOLR
3.0000 g | INTRAMUSCULAR | Status: AC
  Administered 2012-02-29: 3 g via INTRAVENOUS
  Filled 2012-02-28: qty 3000

## 2012-02-28 NOTE — Pre-Procedure Instructions (Signed)
Cardiac and ED info. From 02/23/12 reviewed and accepted by Dr. Arby Barrette. He will re-evaluate pt during repeat PAT if status changed. Otherwise patient is acceptable for surgery tomorrow.

## 2012-02-28 NOTE — Patient Instructions (Addendum)
20 Tonya Quinn  02/28/2012   Your procedure is scheduled on:  02/29/12  Enter through the Main Entrance of [facility] at 1130 AM.  Pick up the phone at the desk and dial [phone].   Call this number if you have problems the morning of surgery: [phone]   Remember:   Do not eat food:After Midnight.  Do not drink clear liquids: After Midnight.  Take these medicines the morning of surgery with A SIP OF WATER: Blood pressure medications, hold Metformin for 24hrs. Bring inhaler.   Do not wear jewelry, make-up or nail polish.  Do not wear lotions, powders, or perfumes. You may wear deodorant.  Do not shave 48 hours prior to surgery.  Do not bring valuables to the hospital.  Contacts, dentures or bridgework may not be worn into surgery.  Leave suitcase in the car. After surgery it may be brought to your room.  For patients admitted to the hospital, checkout time is 11:00 AM the day of discharge.   Patients discharged the day of surgery will not be allowed to drive home.  Name and phone number of your driver: NA  Special Instructions: Shower using CHG 2 nights before surgery and the night before surgery.  If you shower the day of surgery use CHG.  Use special wash - you have one bottle of CHG for all showers.  You should use approximately 1/3 of the bottle for each shower.   Please read over the following fact sheets that you were given: MRSA Information

## 2012-02-29 ENCOUNTER — Ambulatory Visit (HOSPITAL_COMMUNITY): Payer: BC Managed Care – PPO | Admitting: Anesthesiology

## 2012-02-29 ENCOUNTER — Encounter (HOSPITAL_COMMUNITY): Payer: Self-pay

## 2012-02-29 ENCOUNTER — Encounter (HOSPITAL_COMMUNITY)
Admission: RE | Disposition: A | Discharge status: Home / Self Care | Payer: Self-pay | Source: Ambulatory Visit | Attending: Obstetrics and Gynecology

## 2012-02-29 ENCOUNTER — Encounter (HOSPITAL_COMMUNITY): Payer: Self-pay | Admitting: Anesthesiology

## 2012-02-29 ENCOUNTER — Ambulatory Visit (HOSPITAL_COMMUNITY)
Admission: RE | Admit: 2012-02-29 | Discharge: 2012-03-01 | Disposition: A | Discharge status: Home / Self Care | Payer: BC Managed Care – PPO | Source: Ambulatory Visit | Attending: Obstetrics and Gynecology | Admitting: Obstetrics and Gynecology

## 2012-02-29 ENCOUNTER — Encounter (HOSPITAL_COMMUNITY): Payer: Self-pay | Admitting: *Deleted

## 2012-02-29 DIAGNOSIS — N946 Dysmenorrhea, unspecified: Secondary | ICD-10-CM | POA: Insufficient documentation

## 2012-02-29 DIAGNOSIS — D219 Benign neoplasm of connective and other soft tissue, unspecified: Secondary | ICD-10-CM | POA: Diagnosis present

## 2012-02-29 DIAGNOSIS — D252 Subserosal leiomyoma of uterus: Secondary | ICD-10-CM | POA: Insufficient documentation

## 2012-02-29 DIAGNOSIS — Z01818 Encounter for other preprocedural examination: Secondary | ICD-10-CM | POA: Insufficient documentation

## 2012-02-29 DIAGNOSIS — N949 Unspecified condition associated with female genital organs and menstrual cycle: Secondary | ICD-10-CM | POA: Insufficient documentation

## 2012-02-29 DIAGNOSIS — D251 Intramural leiomyoma of uterus: Secondary | ICD-10-CM | POA: Insufficient documentation

## 2012-02-29 DIAGNOSIS — Z01812 Encounter for preprocedural laboratory examination: Secondary | ICD-10-CM | POA: Insufficient documentation

## 2012-02-29 DIAGNOSIS — N920 Excessive and frequent menstruation with regular cycle: Secondary | ICD-10-CM | POA: Insufficient documentation

## 2012-02-29 HISTORY — PX: CYSTOSCOPY: SHX5120

## 2012-02-29 HISTORY — PX: SALPINGOOPHORECTOMY: SHX82

## 2012-02-29 HISTORY — PX: LAPAROSCOPIC ASSISTED VAGINAL HYSTERECTOMY: SHX5398

## 2012-02-29 LAB — GLUCOSE, CAPILLARY
Glucose-Capillary: 141 mg/dL — ABNORMAL HIGH (ref 70–99)
Glucose-Capillary: 149 mg/dL — ABNORMAL HIGH (ref 70–99)
Glucose-Capillary: 248 mg/dL — ABNORMAL HIGH (ref 70–99)
Glucose-Capillary: 268 mg/dL — ABNORMAL HIGH (ref 70–99)

## 2012-02-29 LAB — HCG, SERUM, QUALITATIVE: Preg, Serum: NEGATIVE

## 2012-02-29 SURGERY — HYSTERECTOMY, VAGINAL, LAPAROSCOPY-ASSISTED
Anesthesia: General | Site: Vagina | Wound class: Clean Contaminated

## 2012-02-29 MED ORDER — KETOROLAC TROMETHAMINE 30 MG/ML IJ SOLN: 30.0000 mg | Freq: Once | INTRAMUSCULAR | Status: DC

## 2012-02-29 MED ORDER — SODIUM CHLORIDE 0.9 % IJ SOLN
INTRAMUSCULAR | Status: DC | PRN
  Administered 2012-02-29: 3 mL

## 2012-02-29 MED ORDER — FENTANYL CITRATE 0.05 MG/ML IJ SOLN
25.0000 ug | INTRAMUSCULAR | Status: DC | PRN
  Administered 2012-02-29 (×2): 50 ug via INTRAVENOUS

## 2012-02-29 MED ORDER — GLYCOPYRROLATE 0.2 MG/ML IJ SOLN
INTRAMUSCULAR | Status: AC
  Filled 2012-02-29: qty 1

## 2012-02-29 MED ORDER — FENTANYL CITRATE 0.05 MG/ML IJ SOLN
INTRAMUSCULAR | Status: DC | PRN
  Administered 2012-02-29: 100 ug via INTRAVENOUS
  Administered 2012-02-29: 50 ug via INTRAVENOUS

## 2012-02-29 MED ORDER — PHENYLEPHRINE 40 MCG/ML (10ML) SYRINGE FOR IV PUSH (FOR BLOOD PRESSURE SUPPORT)
PREFILLED_SYRINGE | INTRAVENOUS | Status: AC
  Filled 2012-02-29: qty 5

## 2012-02-29 MED ORDER — PROPOFOL 10 MG/ML IV EMUL
INTRAVENOUS | Status: AC
  Filled 2012-02-29: qty 20

## 2012-02-29 MED ORDER — INDIGOTINDISULFONATE SODIUM 8 MG/ML IJ SOLN
INTRAMUSCULAR | Status: AC
  Filled 2012-02-29: qty 5

## 2012-02-29 MED ORDER — SIMETHICONE 80 MG PO CHEW: 80.0000 mg | CHEWABLE_TABLET | Freq: Four times a day (QID) | ORAL | Status: DC | PRN

## 2012-02-29 MED ORDER — NEOSTIGMINE METHYLSULFATE 1 MG/ML IJ SOLN
INTRAMUSCULAR | Status: AC
  Filled 2012-02-29: qty 1

## 2012-02-29 MED ORDER — PROPOFOL 10 MG/ML IV EMUL
INTRAVENOUS | Status: DC | PRN
  Administered 2012-02-29: 250 mg via INTRAVENOUS

## 2012-02-29 MED ORDER — VASOPRESSIN 20 UNIT/ML IJ SOLN
INTRAMUSCULAR | Status: AC
  Filled 2012-02-29: qty 1

## 2012-02-29 MED ORDER — ONDANSETRON HCL 4 MG/2ML IJ SOLN: 4.0000 mg | Freq: Four times a day (QID) | INTRAMUSCULAR | Status: DC | PRN

## 2012-02-29 MED ORDER — STERILE WATER FOR IRRIGATION IR SOLN
Status: DC | PRN
  Administered 2012-02-29: 1000 mL

## 2012-02-29 MED ORDER — ONDANSETRON HCL 4 MG PO TABS: 4.0000 mg | ORAL_TABLET | Freq: Four times a day (QID) | ORAL | Status: DC | PRN

## 2012-02-29 MED ORDER — LACTATED RINGERS IR SOLN
Status: DC | PRN
  Administered 2012-02-29: 3000 mL

## 2012-02-29 MED ORDER — ROCURONIUM BROMIDE 50 MG/5ML IV SOLN
INTRAVENOUS | Status: AC
  Filled 2012-02-29: qty 1

## 2012-02-29 MED ORDER — MIDAZOLAM HCL 2 MG/2ML IJ SOLN
INTRAMUSCULAR | Status: AC
  Filled 2012-02-29: qty 2

## 2012-02-29 MED ORDER — ALPRAZOLAM 0.25 MG PO TABS: 0.2500 mg | ORAL_TABLET | Freq: Every evening | ORAL | Status: DC | PRN

## 2012-02-29 MED ORDER — HYDROMORPHONE HCL PF 1 MG/ML IJ SOLN
INTRAMUSCULAR | Status: DC | PRN
  Administered 2012-02-29: 1 mg via INTRAVENOUS

## 2012-02-29 MED ORDER — GLYCOPYRROLATE 0.2 MG/ML IJ SOLN
INTRAMUSCULAR | Status: DC | PRN
  Administered 2012-02-29: 0.4 mg via INTRAVENOUS

## 2012-02-29 MED ORDER — KETOROLAC TROMETHAMINE 30 MG/ML IJ SOLN
30.0000 mg | Freq: Four times a day (QID) | INTRAMUSCULAR | Status: DC
  Administered 2012-02-29: 30 mg via INTRAVENOUS
  Filled 2012-02-29: qty 1

## 2012-02-29 MED ORDER — ONDANSETRON HCL 4 MG/2ML IJ SOLN
INTRAMUSCULAR | Status: DC | PRN
  Administered 2012-02-29: 4 mg via INTRAVENOUS

## 2012-02-29 MED ORDER — MIDAZOLAM HCL 5 MG/5ML IJ SOLN
INTRAMUSCULAR | Status: DC | PRN
  Administered 2012-02-29: 2 mg via INTRAVENOUS

## 2012-02-29 MED ORDER — LACTATED RINGERS IV SOLN
INTRAVENOUS | Status: DC
  Administered 2012-02-29 (×4): via INTRAVENOUS

## 2012-02-29 MED ORDER — GLYCOPYRROLATE 0.2 MG/ML IJ SOLN
INTRAMUSCULAR | Status: AC
  Filled 2012-02-29: qty 2

## 2012-02-29 MED ORDER — NEOSTIGMINE METHYLSULFATE 1 MG/ML IJ SOLN
INTRAMUSCULAR | Status: DC | PRN
  Administered 2012-02-29: 2 mg via INTRAVENOUS

## 2012-02-29 MED ORDER — MEPERIDINE HCL 25 MG/ML IJ SOLN: 6.2500 mg | INTRAMUSCULAR | Status: DC | PRN

## 2012-02-29 MED ORDER — DEXTROSE-NACL 5-0.45 % IV SOLN
INTRAVENOUS | Status: DC
  Administered 2012-02-29 – 2012-03-01 (×2): via INTRAVENOUS

## 2012-02-29 MED ORDER — BUPIVACAINE HCL (PF) 0.25 % IJ SOLN
INTRAMUSCULAR | Status: DC | PRN
  Administered 2012-02-29: 13 mL

## 2012-02-29 MED ORDER — ALBUTEROL SULFATE HFA 108 (90 BASE) MCG/ACT IN AERS
INHALATION_SPRAY | RESPIRATORY_TRACT | Status: DC | PRN
  Administered 2012-02-29: 2 via RESPIRATORY_TRACT

## 2012-02-29 MED ORDER — FENTANYL CITRATE 0.05 MG/ML IJ SOLN
INTRAMUSCULAR | Status: AC
  Filled 2012-02-29: qty 2

## 2012-02-29 MED ORDER — HYDROMORPHONE 0.3 MG/ML IV SOLN
INTRAVENOUS | Status: DC
  Administered 2012-02-29: 19:00:00 via INTRAVENOUS
  Administered 2012-02-29: 0.4 mg via INTRAVENOUS
  Administered 2012-03-01: 0.2 mg via INTRAVENOUS
  Filled 2012-02-29: qty 25

## 2012-02-29 MED ORDER — LIDOCAINE HCL (CARDIAC) 20 MG/ML IV SOLN
INTRAVENOUS | Status: AC
  Filled 2012-02-29: qty 5

## 2012-02-29 MED ORDER — KETOROLAC TROMETHAMINE 30 MG/ML IJ SOLN: 30.0000 mg | Freq: Four times a day (QID) | INTRAMUSCULAR | Status: DC

## 2012-02-29 MED ORDER — METFORMIN HCL 500 MG PO TABS
500.0000 mg | ORAL_TABLET | Freq: Two times a day (BID) | ORAL | Status: DC
  Administered 2012-02-29 – 2012-03-01 (×2): 500 mg via ORAL
  Filled 2012-02-29 (×4): qty 1

## 2012-02-29 MED ORDER — VASOPRESSIN 20 UNIT/ML IJ SOLN
INTRAVENOUS | Status: DC | PRN
  Administered 2012-02-29: 14:00:00 via INTRAMUSCULAR

## 2012-02-29 MED ORDER — LISINOPRIL 20 MG PO TABS
20.0000 mg | ORAL_TABLET | Freq: Every day | ORAL | Status: DC
  Administered 2012-03-01: 20 mg via ORAL
  Filled 2012-02-29 (×2): qty 1

## 2012-02-29 MED ORDER — FENTANYL CITRATE 0.05 MG/ML IJ SOLN
INTRAMUSCULAR | Status: DC | PRN
  Administered 2012-02-29: 100 ug via INTRAVENOUS

## 2012-02-29 MED ORDER — PHENYLEPHRINE HCL 10 MG/ML IJ SOLN
INTRAMUSCULAR | Status: DC | PRN
  Administered 2012-02-29: 80 ug via INTRAVENOUS
  Administered 2012-02-29 (×2): 40 ug via INTRAVENOUS
  Administered 2012-02-29: 80 ug via INTRAVENOUS

## 2012-02-29 MED ORDER — DIPHENHYDRAMINE HCL 12.5 MG/5ML PO ELIX: 12.5000 mg | ORAL_SOLUTION | Freq: Four times a day (QID) | ORAL | Status: DC | PRN

## 2012-02-29 MED ORDER — ROCURONIUM BROMIDE 100 MG/10ML IV SOLN
INTRAVENOUS | Status: DC | PRN
  Administered 2012-02-29 (×5): 10 mg via INTRAVENOUS
  Administered 2012-02-29: 30 mg via INTRAVENOUS
  Administered 2012-02-29: 10 mg via INTRAVENOUS

## 2012-02-29 MED ORDER — SUCCINYLCHOLINE CHLORIDE 20 MG/ML IJ SOLN
INTRAMUSCULAR | Status: DC | PRN
  Administered 2012-02-29: 100 mg via INTRAVENOUS

## 2012-02-29 MED ORDER — SUCCINYLCHOLINE CHLORIDE 20 MG/ML IJ SOLN
INTRAMUSCULAR | Status: AC
  Filled 2012-02-29: qty 10

## 2012-02-29 MED ORDER — CYCLOBENZAPRINE HCL 10 MG PO TABS
10.0000 mg | ORAL_TABLET | Freq: Every evening | ORAL | Status: DC | PRN
  Administered 2012-02-29: 10 mg via ORAL
  Filled 2012-02-29: qty 1

## 2012-02-29 MED ORDER — KETOROLAC TROMETHAMINE 30 MG/ML IJ SOLN: 15.0000 mg | Freq: Once | INTRAMUSCULAR | Status: DC | PRN

## 2012-02-29 MED ORDER — SODIUM CHLORIDE 0.9 % IJ SOLN: 9.0000 mL | INTRAMUSCULAR | Status: DC | PRN

## 2012-02-29 MED ORDER — DIPHENHYDRAMINE HCL 50 MG/ML IJ SOLN: 12.5000 mg | Freq: Four times a day (QID) | INTRAMUSCULAR | Status: DC | PRN

## 2012-02-29 MED ORDER — ALUM & MAG HYDROXIDE-SIMETH 200-200-20 MG/5ML PO SUSP: 30.0000 mL | ORAL | Status: DC | PRN

## 2012-02-29 MED ORDER — FENTANYL CITRATE 0.05 MG/ML IJ SOLN
INTRAMUSCULAR | Status: AC
  Filled 2012-02-29: qty 5

## 2012-02-29 MED ORDER — OXYCODONE-ACETAMINOPHEN 5-325 MG PO TABS
1.0000 | ORAL_TABLET | ORAL | Status: DC | PRN
  Administered 2012-03-01: 2 via ORAL
  Filled 2012-02-29 (×2): qty 2

## 2012-02-29 MED ORDER — HYDROMORPHONE HCL PF 1 MG/ML IJ SOLN
INTRAMUSCULAR | Status: AC
  Filled 2012-02-29: qty 1

## 2012-02-29 MED ORDER — NALOXONE HCL 0.4 MG/ML IJ SOLN: 0.4000 mg | INTRAMUSCULAR | Status: DC | PRN

## 2012-02-29 MED ORDER — BUPIVACAINE HCL (PF) 0.25 % IJ SOLN
INTRAMUSCULAR | Status: AC
  Filled 2012-02-29: qty 30

## 2012-02-29 MED ORDER — ONDANSETRON HCL 4 MG/2ML IJ SOLN: 4.0000 mg | Freq: Once | INTRAMUSCULAR | Status: DC | PRN

## 2012-02-29 MED ORDER — BUDESONIDE-FORMOTEROL FUMARATE 160-4.5 MCG/ACT IN AERO
2.0000 | INHALATION_SPRAY | Freq: Two times a day (BID) | RESPIRATORY_TRACT | Status: DC
  Administered 2012-02-29 – 2012-03-01 (×2): 2 via RESPIRATORY_TRACT
  Filled 2012-02-29: qty 6

## 2012-02-29 SURGICAL SUPPLY — 50 items
ADH SKN CLS APL DERMABOND .7 (GAUZE/BANDAGES/DRESSINGS) ×4
BLADE SURG 15 STRL LF C SS BP (BLADE) IMPLANT
BLADE SURG 15 STRL SS (BLADE)
CATH ROBINSON RED A/P 16FR (CATHETERS) ×5 IMPLANT
CHLORAPREP W/TINT 26ML (MISCELLANEOUS) ×5 IMPLANT
CLOTH BEACON ORANGE TIMEOUT ST (SAFETY) ×5 IMPLANT
CONT PATH 16OZ SNAP LID 3702 (MISCELLANEOUS) ×5 IMPLANT
COVER TABLE BACK 60X90 (DRAPES) ×5 IMPLANT
DECANTER SPIKE VIAL GLASS SM (MISCELLANEOUS) ×4 IMPLANT
DERMABOND ADVANCED (GAUZE/BANDAGES/DRESSINGS) ×1
DERMABOND ADVANCED .7 DNX12 (GAUZE/BANDAGES/DRESSINGS) ×4 IMPLANT
DRAPE HYSTEROSCOPY (DRAPE) ×5 IMPLANT
ELECT REM PT RETURN 9FT ADLT (ELECTROSURGICAL) ×5
ELECTRODE REM PT RTRN 9FT ADLT (ELECTROSURGICAL) ×1 IMPLANT
GAUZE PACKING 2X5 YD STERILE (GAUZE/BANDAGES/DRESSINGS) ×5 IMPLANT
GLOVE BIO SURGEON STRL SZ8 (GLOVE) ×5 IMPLANT
GLOVE BIOGEL PI IND STRL 6.5 (GLOVE) ×4 IMPLANT
GLOVE BIOGEL PI INDICATOR 6.5 (GLOVE) ×1
GLOVE ORTHO TXT STRL SZ7.5 (GLOVE) ×10 IMPLANT
GOWN STRL REIN XL XLG (GOWN DISPOSABLE) ×20 IMPLANT
NDL INSUFFLATION 14GA 120MM (NEEDLE) ×3 IMPLANT
NEEDLE HYPO 22GX1.5 SAFETY (NEEDLE) IMPLANT
NEEDLE INSUFFLATION 14GA 120MM (NEEDLE) ×5 IMPLANT
NEEDLE MAYO .5 CIRCLE (NEEDLE) IMPLANT
NS IRRIG 1000ML POUR BTL (IV SOLUTION) ×5 IMPLANT
PACK LAVH (CUSTOM PROCEDURE TRAY) ×5 IMPLANT
PACK VAGINAL WOMENS (CUSTOM PROCEDURE TRAY) ×5 IMPLANT
PROTECTOR NERVE ULNAR (MISCELLANEOUS) ×10 IMPLANT
SCALPEL HARMONIC ACE (MISCELLANEOUS) ×5 IMPLANT
SET CYSTO W/LG BORE CLAMP LF (SET/KITS/TRAYS/PACK) IMPLANT
SET IRRIG TUBING LAPAROSCOPIC (IRRIGATION / IRRIGATOR) ×5 IMPLANT
SUT CHROMIC 1MO 4 18 CR8 (SUTURE) ×10 IMPLANT
SUT CHROMIC GUT AB #0 18 (SUTURE) ×5 IMPLANT
SUT PROLENE 1 CTX 30  8455H (SUTURE)
SUT PROLENE 1 CTX 30 8455H (SUTURE) IMPLANT
SUT SILK 0 SH 30 (SUTURE) ×5 IMPLANT
SUT SILK 2 0 SH (SUTURE) ×5 IMPLANT
SUT VIC AB 2-0 CT1 (SUTURE) ×5 IMPLANT
SUT VIC AB 2-0 CT2 27 (SUTURE) ×20 IMPLANT
SUT VIC AB 3-0 CT1 27 (SUTURE)
SUT VIC AB 3-0 CT1 TAPERPNT 27 (SUTURE) IMPLANT
SUT VIC AB 3-0 SH 27 (SUTURE)
SUT VIC AB 3-0 SH 27X BRD (SUTURE) IMPLANT
SUT VICRYL 0 UR6 27IN ABS (SUTURE) IMPLANT
SUT VICRYL 4-0 PS2 18IN ABS (SUTURE) ×5 IMPLANT
TOWEL OR 17X24 6PK STRL BLUE (TOWEL DISPOSABLE) ×10 IMPLANT
TRAY FOLEY CATH 14FR (SET/KITS/TRAYS/PACK) ×5 IMPLANT
TROCAR XCEL NON-BLD 5MMX100MML (ENDOMECHANICALS) ×15 IMPLANT
WARMER LAPAROSCOPE (MISCELLANEOUS) ×5 IMPLANT
WATER STERILE IRR 1000ML POUR (IV SOLUTION) ×5 IMPLANT

## 2012-02-29 NOTE — Transfer of Care (Signed)
Immediate Anesthesia Transfer of Care Note  Patient: Tonya Quinn  Procedure(s) Performed: Procedure(s) (LRB) with comments: LAPAROSCOPIC ASSISTED VAGINAL HYSTERECTOMY (N/A) SALPINGO OOPHORECTOMY (Bilateral) CYSTOSCOPY (N/A)  Patient Location: PACU  Anesthesia Type:General  Level of Consciousness: awake, alert  and oriented  Airway & Oxygen Therapy: Patient Spontanous Breathing and Patient connected to nasal cannula oxygen  Post-op Assessment: Report given to PACU RN  Post vital signs: stable  Complications: No apparent anesthesia complications

## 2012-02-29 NOTE — Progress Notes (Signed)
Patient ID: Tonya Quinn, female   DOB: Dec 12, 1965, 46 y.o.   MRN: [id number] VS stable Pt has been in her room 45 minutes There is a small amount of blue tinged urine in the Foley bag. The pt is awake and alert and her abdomen is soft and not tender.

## 2012-02-29 NOTE — Interval H&P Note (Signed)
History and Physical Interval Note:  02/29/2012 12:41 PM  Tonya Quinn  has presented today for surgery, with the diagnosis of AUB, pelvic pain  The various methods of treatment have been discussed with the patient and family. After consideration of risks, benefits and other options for treatment, the patient has consented to  Procedure(s) (LRB) with comments: LAPAROSCOPIC ASSISTED VAGINAL HYSTERECTOMY (N/A) SALPINGO OOPHORECTOMY (Bilateral) ANTERIOR (CYSTOCELE) AND POSTERIOR REPAIR (RECTOCELE) (N/A) - anterior repair as a surgical intervention .  The patient's history has been reviewed, patient examined, no change in status, stable for surgery.  I have reviewed the patient's chart and labs.  Questions were answered to the patient's satisfaction.     Jentry Warnell D

## 2012-02-29 NOTE — H&P (Signed)
Tonya Quinn is an 46 y.o. female. I saw her for the first time in October of this year.  She has previously had Novasure.  She is having monthly menses that are heavy with severe cramps.  She is also having some pelvic pain and has had dyspareunia.  She has an asymptomatic cystocele on exam.  Medical and surgical options were discussed, she wishes to proceed with definitive surgical therapy.  When she was at her pre-op appointment last week, she c/o chest pain and her EKG was abnormal.  She was seen at [facility] and evaluated and sent home.    Pertinent Gynecological History: Last pap: normal Date: 01-12-12 OB History: G4, P4004 SVD at term x 4     Past Medical History  Diagnosis Date  . Diabetes mellitus   . Hypertension   . Asthma     Past Surgical History  Procedure Date  . Vein surgery   . Uterine ablation   Bilateral tubal ligation  No family history on file.  Social History:  reports that she has never smoked. She does not have any smokeless tobacco history on file. She reports that she does not drink alcohol or use illicit drugs.  Allergies: No Known Allergies  No prescriptions prior to admission    Review of Systems  Respiratory: Negative.   Cardiovascular: Negative.   Gastrointestinal: Positive for constipation.  Genitourinary: Positive for urgency.    There were no vitals taken for this visit. Physical Exam  Constitutional: She appears well-developed and well-nourished.  Neck: Neck supple. No thyromegaly present.  Cardiovascular: Normal rate, regular rhythm and normal heart sounds.   No murmur heard. Respiratory: Effort normal and breath sounds normal. No respiratory distress. She has no wheezes.  GI: Soft. She exhibits no distension and no mass. There is no tenderness.  Genitourinary:       Gr I-II cystocele Uterus midplanar to anteverted, slightly tender, slightly enlarged No adnexal mass, bilateral tenderness    No results found for this or any previous  visit (from the past 24 hour(s)).  No results found.  Assessment/Plan: Menorrhagia, dysmenorrhea, pelvic pain and cystocele.  She has had prior Novasure, cystocele is asymptomatic.  All medical and surgical options have been discussed, she wishes to proceed with definitive surgical therapy.  The different routes of surgery were discussed, risks, alternatives, chances of alleviating her symptoms.  Will admit for LAVH, poss BSO, probable anterior repair.  Xinyi Batton D 02/29/2012, 9:00 AM

## 2012-02-29 NOTE — Anesthesia Preprocedure Evaluation (Signed)
Anesthesia Evaluation  Patient identified by MRN, date of birth, ID band Patient awake    Reviewed: Allergy & Precautions, H&P , NPO status , Patient's Chart, lab work & pertinent test results  Airway Mallampati: I TM Distance: >3 FB Neck ROM: full    Dental No notable dental hx. (+) Teeth Intact   Pulmonary    Pulmonary exam normal       Cardiovascular hypertension, Pt. on medications     Neuro/Psych negative neurological ROS  negative psych ROS   GI/Hepatic negative GI ROS, Neg liver ROS,   Endo/Other  diabetes, Oral Hypoglycemic AgentsMorbid obesity  Renal/GU negative Renal ROS  negative genitourinary   Musculoskeletal negative musculoskeletal ROS (+)   Abdominal (+) + obese,   Peds negative pediatric ROS (+)  Hematology negative hematology ROS (+)   Anesthesia Other Findings   Reproductive/Obstetrics negative OB ROS                           Anesthesia Physical Anesthesia Plan  ASA: III  Anesthesia Plan: General   Post-op Pain Management:    Induction: Intravenous  Airway Management Planned: Oral ETT  Additional Equipment:   Intra-op Plan:   Post-operative Plan:   Informed Consent: I have reviewed the patients History and Physical, chart, labs and discussed the procedure including the risks, benefits and alternatives for the proposed anesthesia with the patient or authorized representative who has indicated his/her understanding and acceptance.   Dental Advisory Given  Plan Discussed with: CRNA and Surgeon  Anesthesia Plan Comments:         Anesthesia Quick Evaluation

## 2012-02-29 NOTE — Op Note (Signed)
Preoperative diagnosis: Menorrhagia, dysmenorrhea, pelvic pain Postoperative diagnosis:  Same, leiomyomatous uterus, enlarged ovaries Procedure: Laparoscopic-assisted vaginal hysterectomy, BSO, cystoscopy Surgeon: Lavina Hamman M.D. Assistant: Sherian Rein, MD Anesthesia: Gen. Endotracheal tube Findings: She had a normal upper abdomen.  Uterus was enlarged with obvious fibroids.  Both ovaries were significantly enlarged.  With cystoscopy, both ureters were patent, bladder intact. Estimated blood loss: 500 cc Specimens: Uterus, tubes and ovaries sent for routine pathology Complications: None  Procedure in detail: The patient was taken to the operating room and placed in the dorsosupine position. General anesthesia was induced and legs were placed in mobile stirrups and arms tucked to her sides. Abdomen and perineum were then prepped and draped in usual sterile fashion, bladder drained with a red Robinson catheter, Hulka tenaculum applied to the cervix for uterine manipulation. Infraumbilical skin was then infiltrated with quarter percent Marcaine and a 1 cm vertical incision was made. Veress needle was inserted into the peritoneal cavity and placement confirmed by the water drop test an opening pressure of 6 mm mercury. CO2 was insufflated to a pressure of 13 mm mercury and the Veress needle was removed. A 5 mm trocar was introduced with direct visualization. A 5 mm port was then placed on the left side also under direct visualization. Inspection revealed the above-mentioned findings. A third 5 mm port was placed on the right side also under direct visualization. Inspection revealed the above mentioned findings.  The ovaries were both enlarged enough that I felt they needed to be removed. The right distal tube was grasped with an atraumatic grasper.  The Harmonic scalpel Ace was then used to take down the right infundibulopelvic ligament, mesosalpinx, round ligament and broad ligament. We then  incised the anterior peritoneum across the anterior lower part of the uterus. A similar procedure was then performed on the left side taking down the infundibulopelvic ligament, round ligament, broad ligament, coming around an anterior myoma as well. Anterior peritoneum was incised across the anterior part of the uterus to meet the incision coming from the right side. At this point the uterus was fairly free and there is adequate hemostasis to proceed vaginally.  The legs were elevated in stirrups. A weighted speculum was inserted in the vagina. The cervix was grasped with Christella Hartigan tenaculums. A dilute solution of Pitressin was infiltrated around the cervicovaginal junction which was then incised circumferentially with electrocautery. Sharp dissection was then used to further free the vagina from the cervix. Marland Kitchen Posterior cul-de-sac was identified and entered sharply. A Bonnano speculum was placed into the posterior cul-de-sac. Uterosacral ligaments were clamped transected and ligated with #1 chromic and tagged for later use. Cardinal ligaments and uterine arteries were likewise clamped transected and ligated with #1 chromic. Anterior peritoneum was identified and entered sharply. A Deaver retractor was then to retract the bladder anterior.  The remaining pedicles were clamped transected and ligated and the uterus was removed. Bleeding bilaterally was controlled with figure 8 of #1 Chromic. The uterosacral ligaments were plicated in the midline with 0 silk and the previously tagged uterosacral pedicles were also tied in the midline. The vaginal cuff was then closed in a vertical fashion with running locking 2-0 Vicryl with adequate closure and adequate hemostasis.  Cystoscopy was performed with a 70 degree scope.  The bladder was intact, blue tinted urine from indigo carmine was seen to flow from each ureteral orifice.   A Foley catheter was then placed.  There was no significant cystocele to repair.  Attention was  turned back to the abdomen. The scrub tech and myself all changed gloves. The abdomen was reinsufflated. Laparoscope was reinserted and good visualization was achieved. A small amount of bleeding from the left uterine artery was controlled with the Harmonic scalpel. Both ureters were identified and found to be below incision lines. The 5 mm ports on the left and right side were removed under direct visualization. The laparoscope was then removed and all gas was allowed to deflate from the abdomen. The umbilical trocar was then removed. Skin incisions were closed with interrupted subcuticular sutures of 4-0 Vicryl followed by Dermabond. The patient was awakened in the operating room and taken to the recovery room in stable condition after tolerating the procedure well. Counts were correct x2, she received Ancef 3 g IV the beginning of the procedure, she had PAS hose on throughout the procedure.

## 2012-03-01 ENCOUNTER — Encounter (HOSPITAL_COMMUNITY): Payer: Self-pay | Admitting: Obstetrics and Gynecology

## 2012-03-01 DIAGNOSIS — D219 Benign neoplasm of connective and other soft tissue, unspecified: Secondary | ICD-10-CM | POA: Diagnosis present

## 2012-03-01 HISTORY — DX: Benign neoplasm of connective and other soft tissue, unspecified: D21.9

## 2012-03-01 LAB — CBC
Hemoglobin: 8.8 g/dL — ABNORMAL LOW (ref 12.0–15.0)
Hemoglobin: 8.5 g/dL — ABNORMAL LOW (ref 12.0–15.0)
MCH: 27.9 pg (ref 26.0–34.0)
MCHC: 32.4 g/dL (ref 30.0–36.0)
MCHC: 32 g/dL (ref 30.0–36.0)
MCV: 87.2 fL (ref 78.0–100.0)
Platelets: 144 10*3/uL — ABNORMAL LOW (ref 150–400)
RBC: 3.13 MIL/uL — ABNORMAL LOW (ref 3.87–5.11)
RBC: 3.05 MIL/uL — ABNORMAL LOW (ref 3.87–5.11)

## 2012-03-01 LAB — GLUCOSE, CAPILLARY
Glucose-Capillary: 218 mg/dL — ABNORMAL HIGH (ref 70–99)
Glucose-Capillary: 225 mg/dL — ABNORMAL HIGH (ref 70–99)
Glucose-Capillary: 266 mg/dL — ABNORMAL HIGH (ref 70–99)

## 2012-03-01 MED ORDER — OXYCODONE-ACETAMINOPHEN 5-325 MG PO TABS: 1.0000 | ORAL_TABLET | ORAL | Status: DC | PRN

## 2012-03-01 MED ORDER — INSULIN ASPART 100 UNIT/ML ~~LOC~~ SOLN
3.0000 [IU] | Freq: Once | SUBCUTANEOUS | Status: AC
  Administered 2012-03-01: 3 [IU] via SUBCUTANEOUS

## 2012-03-01 MED ORDER — INSULIN REGULAR HUMAN 100 UNIT/ML IJ SOLN: 3.0000 [IU] | Freq: Once | INTRAMUSCULAR | Status: DC

## 2012-03-01 NOTE — Discharge Summary (Signed)
Physician Discharge Summary  Patient ID: Tonya Quinn MRN: [id number] DOB/AGE: Jan 08, 1966 46 y.o.  Admit date: 02/29/2012 Discharge date: 03/01/2012  Admission Diagnoses:  Menorrhagia, dysmenorrhea, pelvic pain  Discharge Diagnoses: Symptomatic fibroids, enlarged ovaries Active Problems:  Fibroids   Discharged Condition: good  Hospital Course: Admitted on 12-18 and had LAVH/BSO and cystoscopy, enlarged uterus with fibroids and both ovaries enlarged.  EBL 500.  No post-op problems.  Consults: None   Discharge Exam: Blood pressure 115/71, pulse 88, temperature 98.8 F (37.1 C), temperature source Oral, resp. rate 18, height 5\' 8"  (1.727 m), weight 136.986 kg (302 lb), last menstrual period 02/09/2012, SpO2 94.00%. General appearance: alert  Disposition: 01-Home or Self Care  Discharge Orders    Future Orders Please Complete By Expires   Diet - low sodium heart healthy      Increase activity slowly      Lifting restrictions      Comments:   10 lbs   Sexual Activity Restrictions      Comments:   Nothing in vagina       Medication List     As of 03/01/2012  5:31 PM    TAKE these medications         ALPRAZolam 0.25 MG tablet   Commonly known as: XANAX   Take 0.25 mg by mouth at bedtime as needed. For anxiety      amoxicillin 500 MG capsule   Commonly known as: AMOXIL   Take 1,000 mg by mouth 2 (two) times daily. Only took 1 tablet twice daily initially. Pt is finishing out treatment.      budesonide-formoterol 160-4.5 MCG/ACT inhaler   Commonly known as: SYMBICORT   Inhale 2 puffs into the lungs 2 (two) times daily as needed. For shortness of breath      cyclobenzaprine 10 MG tablet   Commonly known as: FLEXERIL   Take 10 mg by mouth at bedtime as needed. For back spasm      diclofenac 75 MG EC tablet   Commonly known as: VOLTAREN   Take 75 mg by mouth 2 (two) times daily as needed. For pain      lisinopril 20 MG tablet   Commonly known as:  PRINIVIL,ZESTRIL   Take 20 mg by mouth daily.      metFORMIN 500 MG tablet   Commonly known as: GLUCOPHAGE   Take 500 mg by mouth 2 (two) times daily.      naproxen sodium 550 MG tablet   Commonly known as: ANAPROX   Take 550 mg by mouth 2 (two) times daily as needed. For pain. Does not take while taking diclofenac.      norethindrone 0.35 MG tablet   Commonly known as: MICRONOR,CAMILA,ERRIN   Take 1 tablet by mouth daily.      oxyCODONE-acetaminophen 5-325 MG per tablet   Commonly known as: PERCOCET/ROXICET   Take 1-2 tablets by mouth every 4 (four) hours as needed (moderate to severe pain (when tolerating fluids)).      VITAMIN D PO   Take 1 tablet by mouth daily.           Follow-up Information    Follow up with Tibor Lemmons D, MD. Schedule an appointment as soon as possible for a visit in 2 weeks.   Contact information:   [address], [address] [place] [address] [phone]          Signed: Zenaida Niece 03/01/2012, 5:31 PM

## 2012-03-01 NOTE — Progress Notes (Signed)
Pt is discharged in the care of Son . Downstairs per wheelchair. Stable Abdominal incisions are clean. Discharge instructions were given to pt. Understood all instructions well.Denies pain or discomfort.

## 2012-03-01 NOTE — Anesthesia Postprocedure Evaluation (Signed)
Anesthesia Post Note  Patient: Tonya Quinn  Procedure(s) Performed: Procedure(s) (LRB): LAPAROSCOPIC ASSISTED VAGINAL HYSTERECTOMY (N/A) SALPINGO OOPHORECTOMY (Bilateral) CYSTOSCOPY (N/A)  Anesthesia type: General  Patient location: PACU  Post pain: Pain level controlled  Post assessment: Post-op Vital signs reviewed  Last Vitals:  Filed Vitals:   03/01/12 0601  BP: 100/63  Pulse: 87  Temp: 36.4 C  Resp: 16    Post vital signs: Reviewed  Level of consciousness: sedated  Complications: No apparent anesthesia complications

## 2012-03-01 NOTE — Anesthesia Postprocedure Evaluation (Signed)
  Anesthesia Post-op Note  Patient: Tonya Quinn  Procedure(s) Performed: Procedure(s) (LRB) with comments: LAPAROSCOPIC ASSISTED VAGINAL HYSTERECTOMY (N/A) SALPINGO OOPHORECTOMY (Bilateral) CYSTOSCOPY (N/A)   Anesthesia Post-op Note  Patient: Tonya Quinn  Procedure(s) Performed: Procedure(s) (LRB) with comments: LAPAROSCOPIC ASSISTED VAGINAL HYSTERECTOMY (N/A) SALPINGO OOPHORECTOMY (Bilateral) CYSTOSCOPY (N/A)  Patient Location: Women's unit  Anesthesia Type:General  Level of Consciousness: awake, alert  and oriented  Airway and Oxygen Therapy: Patient Spontanous Breathing and Patient connected to nasal cannula oxygen  Post-op Pain: none  Post-op Assessment: Post-op Vital signs reviewed and Patient's Cardiovascular Status Stable  Post-op Vital Signs: Reviewed and stable  Complications: No apparent anesthesia complications  Anesthesia Post-op Note  Patient: Tonya Quinn  Procedure(s) Performed: Procedure(s) (LRB) with comments: LAPAROSCOPIC ASSISTED VAGINAL HYSTERECTOMY (N/A) SALPINGO OOPHORECTOMY (Bilateral) CYSTOSCOPY (N/A)  Patient Location: Women's unit  Anesthesia Type:General  Level of Consciousness: awake, alert  and oriented  Airway and Oxygen Therapy: Patient Spontanous Breathing and Patient connected to nasal cannula oxygen  Post-op Pain: none  Post-op Assessment: Post-op Vital signs reviewed and Patient's Cardiovascular Status Stable  Post-op Vital Signs: Reviewed and stable  Complications: No apparent anesthesia complications

## 2012-03-01 NOTE — Addendum Note (Signed)
Addendum  created 03/01/12 0825 by Shanon Payor, CRNA   Modules edited:Notes Section

## 2012-03-01 NOTE — Progress Notes (Signed)
POD #1 LAVH/BSO Feels ok Afeb, VSS Abd- soft, incisions ok HGb 12-8, CBG 189 this am Will d/c foley, ambulate.  Continue Metformin and cover with insulin if needed.  Recheck Hgb at 1100 to make sure it is stable.  Will consider d/c later today if stable.

## 2012-11-27 DIAGNOSIS — F331 Major depressive disorder, recurrent, moderate: Secondary | ICD-10-CM

## 2012-11-27 HISTORY — DX: Major depressive disorder, recurrent, moderate: F33.1

## 2013-07-05 ENCOUNTER — Encounter: Payer: Self-pay | Admitting: *Deleted

## 2013-08-16 ENCOUNTER — Encounter: Payer: Self-pay | Admitting: Medical

## 2013-08-16 ENCOUNTER — Ambulatory Visit (INDEPENDENT_AMBULATORY_CARE_PROVIDER_SITE_OTHER): Payer: BC Managed Care – PPO | Admitting: Medical

## 2013-08-16 VITALS — BP 149/94 | Temp 98.2°F | Ht 66.0 in | Wt 299.0 lb

## 2013-08-16 DIAGNOSIS — N949 Unspecified condition associated with female genital organs and menstrual cycle: Secondary | ICD-10-CM

## 2013-08-16 DIAGNOSIS — R102 Pelvic and perineal pain: Secondary | ICD-10-CM

## 2013-08-16 NOTE — Patient Instructions (Signed)
Laparoscopically Assisted Vaginal Hysterectomy  A laparoscopically assisted vaginal hysterectomy (LAVH) is a surgical procedure to remove the uterus and cervix, and sometimes the ovaries and fallopian tubes. During an LAVH, some of the surgical removal is done through the vagina, and the rest is done through a few small surgical cuts (incisions) in the abdomen.  This procedure is usually considered in women when a vaginal hysterectomy is not an option. Your health care provider will discuss the risks and benefits of the different surgical techniques at your appointment. Generally, recovery time is faster and there are fewer complications after laparoscopic procedures than after open incisional procedures. LET [facility] CARE PROVIDER KNOW ABOUT:   Any allergies you have.  All medicines you are taking, including vitamins, herbs, eye drops, creams, and over-the-counter medicines.  Previous problems you or members of your family have had with the use of anesthetics.  Any blood disorders you have.  Previous surgeries you have had.  Medical conditions you have. RISKS AND COMPLICATIONS Generally, this is a safe procedure. However, as with any procedure, complications can occur. Possible complications include:  Allergies to medicines.  Difficulty breathing.  Bleeding.  Infection.  Damage to other structures near your uterus and cervix. BEFORE THE PROCEDURE  Ask your health care provider about changing or stopping your regular medicines.  Take certain medicines, such as a colon-emptying preparation, as directed.  Do not eat or drink anything for at least 8 hours before your surgery.  Stop smoking if you smoke. Stopping will improve your health after surgery.  Arrange for a ride home after surgery and for help at home during recovery. PROCEDURE   An IV tube will be put into one of your veins in order to give you fluids and medicines.  You will receive medicines to relax you and  medicines that make you sleep (general anesthetic).  You may have a flexible tube (catheter) put into your bladder to drain urine.  You may have a tube put through your nose or mouth that goes into your stomach (nasogastric tube). The nasogastric tube removes digestive fluids and prevents you from feeling nauseated and from vomiting.  Tight-fitting (compression) stockings will be placed on your legs to promote circulation.  Three to four small incisions will be made in your abdomen. An incision also will be made in your vagina. Probes and tools will be inserted into the small incisions. The uterus and cervix are removed (and possibly your ovaries and fallopian tubes) through your vagina as well as through the small incisions that were made in the abdomen.  Your vagina is then sewn back to normal. AFTER THE PROCEDURE  You may have a liquid diet temporarily. You will most likely return to, and tolerate, your usual diet the day after surgery.  You will be passing urine through a catheter. It will be removed the day after surgery.  Your temperature, breathing rate, heart rate, blood pressure, and oxygen level will be monitored regularly.  You will still wear compression stockings on your legs until you are able to move around.  You will use a special device or do breathing exercises to keep your lungs clear.  You will be encouraged to walk as soon as possible. Document Released: 02/17/2011 Document Revised: 10/31/2012 Document Reviewed: 09/13/2012 [facility] Patient Information 2014 [place], [place].

## 2013-08-16 NOTE — Progress Notes (Signed)
Patient ID: Tonya Quinn, female   DOB: September 04, 1965, 48 y.o.   MRN: [id number]  History:  [name] is a 48 y.o. No obstetric history on file. who presents to clinic today for suspected vaginal bleeding ~ every other month. Patient had LAVH/BSO 02/2012. Patient states that she bleeds like a period and cramps as well. This has been an issue since the surgery. Occasional weakness, dizziness, fatigue. Patients states that she feels bloated and has back pain when she has the bleeding episodes. Patient states that last episode of bleeding was last week and was also associated with 3 days of diarrhea. She states that she "is sure that this is not rectal bleeding."  The following portions of the patient's history were reviewed and updated as appropriate: allergies, current medications, past family history, past medical history, past social history, past surgical history and problem list.  Review of Systems:  Pertinent items are noted in HPI.  Objective:  Physical Exam BP 149/94  Temp(Src) 98.2 F (36.8 C) (Oral)  Ht 5\' 6"  (1.676 m)  Wt 299 lb (135.626 kg)  BMI 48.28 kg/m2 GENERAL: Well-developed, well-nourished female in no acute distress.  LUNGS: Normal rate.  HEART: Regular rate   ABDOMEN: Soft, nontender, nondistended. No organomegaly. Normal bowel sounds appreciated in all quadrants.  PELVIC: Normal external female genitalia. Vagina is pink and rugated.  Normal discharge. Cuff intact. No bleeding noted RECTUM: multiple small external hemorrhoids. No active bleeding.  EXTREMITIES: No cyanosis, clubbing, or edema  Labs and Imaging Pelvic and transvaginal US ordered  Assessment & Plan:  Assessment: Vaginal bleeding s/p LAVH/BSO  Plans: 1. Pelvic US ordered 2. Patient to return to [facility] in ~ 4 weeks for results and management  Farris Has, PA-C 08/16/2013 8:49 AM

## 2013-08-16 NOTE — Progress Notes (Signed)
Patient here today reporting vaginal bleeding like a period after total hysterectomy in December 2013-- states she has had bleeding on and off since then along with severe lower back, cramping, and mood swings. Last episode of bleeding was last Wednesday-- and only lasted for the morning.

## 2013-08-21 ENCOUNTER — Ambulatory Visit (HOSPITAL_COMMUNITY)
Admission: RE | Admit: 2013-08-21 | Discharge: 2013-08-21 | Disposition: A | Discharge status: Home / Self Care | Payer: Self-pay | Source: Ambulatory Visit | Attending: Medical | Admitting: Medical

## 2013-08-21 DIAGNOSIS — N949 Unspecified condition associated with female genital organs and menstrual cycle: Secondary | ICD-10-CM | POA: Insufficient documentation

## 2013-08-21 DIAGNOSIS — N9489 Other specified conditions associated with female genital organs and menstrual cycle: Secondary | ICD-10-CM | POA: Insufficient documentation

## 2013-08-21 DIAGNOSIS — N898 Other specified noninflammatory disorders of vagina: Secondary | ICD-10-CM | POA: Insufficient documentation

## 2013-08-21 DIAGNOSIS — Z90710 Acquired absence of both cervix and uterus: Secondary | ICD-10-CM | POA: Insufficient documentation

## 2013-08-21 DIAGNOSIS — R102 Pelvic and perineal pain: Secondary | ICD-10-CM

## 2013-08-26 ENCOUNTER — Telehealth: Payer: Self-pay

## 2013-08-26 DIAGNOSIS — R102 Pelvic and perineal pain: Secondary | ICD-10-CM

## 2013-08-26 DIAGNOSIS — N83209 Unspecified ovarian cyst, unspecified side: Secondary | ICD-10-CM

## 2013-08-26 NOTE — Telephone Encounter (Signed)
Follow up ultrasound scheduled for Tuesday 11/26/13 at 0915. Attempted to call patient. Number disconnected. Called emergency contact Lurene Shadow who stated she would have patient call us.

## 2013-08-26 NOTE — Telephone Encounter (Signed)
Message copied by Geanie Logan on Mon Aug 26, 2013  4:38 PM ------      Message from: Farris Has      Created: Mon Aug 26, 2013  4:00 PM       Please call patient and inform her that US showed a small cyst that may be contributing to her pain. It is most likely something that has developed in the scar tissue from her surgery and should resolve. Please schedule repeat US in 3 months and clinic follow-up with MD after that. Patient should also see PCP because there is no reason for vaginal bleeding. The only possible source of bleeding is rectal given exam and imaging.             Thanks!            Almyra Free ------

## 2013-08-27 NOTE — Telephone Encounter (Signed)
Patient called back and stated that she is returning our call. She can be reached at [phone]

## 2013-08-27 NOTE — Telephone Encounter (Signed)
I called pt @ the number she gave [phone]) and heard message stating that the number has been disconnected. I then called her aunt Tonya Quinn again and stated that we are still trying to get in touch with Tonya Quinn. She agreed to give a message to Tonya Quinn and have her call us again. I stated that she may call the appt line and ask to speak to a nurse.

## 2013-08-27 NOTE — Telephone Encounter (Signed)
Patient returned call and stated we could reach her at [phone]. Attempted to call patient at this number, however, heard number has been disconnected. Called number patient called from [phone]) and left message stating we are calling for Tonya Quinn, we have attempted to call on [phone] however hear number has been disconnected, please call clinic at your convenience.

## 2013-08-27 NOTE — Telephone Encounter (Signed)
Pt returned our call and I spoke with her. I informed her of the [place] results and that Rozelle Logan, PA wanted her to have repeat US in 3 months. Her appt is scheduled on 11/26/13 @ 0915. I also stated that Almyra Free would like her to follow up with her PCP because there is no explanation for vaginal bleeding. She will need further evaluation for possible rectal bleeding. Pt voiced understanding and agreed to have [place] on 9/15 as scheduled.

## 2013-11-12 DIAGNOSIS — M797 Fibromyalgia: Secondary | ICD-10-CM

## 2013-11-12 DIAGNOSIS — G4710 Hypersomnia, unspecified: Secondary | ICD-10-CM

## 2013-11-12 HISTORY — DX: Fibromyalgia: M79.7

## 2013-11-12 HISTORY — DX: Hypersomnia, unspecified: G47.10

## 2013-11-26 ENCOUNTER — Ambulatory Visit (HOSPITAL_COMMUNITY): Payer: Self-pay | Attending: Medical

## 2014-09-10 DIAGNOSIS — Z5989 Other problems related to housing and economic circumstances: Secondary | ICD-10-CM | POA: Insufficient documentation

## 2014-09-10 DIAGNOSIS — Z5971 Insufficient health insurance coverage: Secondary | ICD-10-CM

## 2014-09-10 DIAGNOSIS — K5901 Slow transit constipation: Secondary | ICD-10-CM | POA: Insufficient documentation

## 2014-09-10 HISTORY — DX: Insufficient health insurance coverage: Z59.71

## 2014-09-10 HISTORY — DX: Other problems related to housing and economic circumstances: Z59.89

## 2014-09-10 HISTORY — DX: Slow transit constipation: K59.01

## 2014-09-14 ENCOUNTER — Emergency Department (HOSPITAL_BASED_OUTPATIENT_CLINIC_OR_DEPARTMENT_OTHER): Payer: Self-pay

## 2014-09-14 ENCOUNTER — Emergency Department (HOSPITAL_BASED_OUTPATIENT_CLINIC_OR_DEPARTMENT_OTHER)
Admission: EM | Admit: 2014-09-14 | Discharge: 2014-09-14 | Disposition: A | Discharge status: Home / Self Care | Payer: Self-pay | Attending: Emergency Medicine | Admitting: Emergency Medicine

## 2014-09-14 ENCOUNTER — Encounter (HOSPITAL_BASED_OUTPATIENT_CLINIC_OR_DEPARTMENT_OTHER): Payer: Self-pay | Admitting: *Deleted

## 2014-09-14 DIAGNOSIS — Z791 Long term (current) use of non-steroidal anti-inflammatories (NSAID): Secondary | ICD-10-CM | POA: Insufficient documentation

## 2014-09-14 DIAGNOSIS — E119 Type 2 diabetes mellitus without complications: Secondary | ICD-10-CM | POA: Insufficient documentation

## 2014-09-14 DIAGNOSIS — B9689 Other specified bacterial agents as the cause of diseases classified elsewhere: Secondary | ICD-10-CM

## 2014-09-14 DIAGNOSIS — N760 Acute vaginitis: Secondary | ICD-10-CM | POA: Insufficient documentation

## 2014-09-14 DIAGNOSIS — R1031 Right lower quadrant pain: Secondary | ICD-10-CM

## 2014-09-14 DIAGNOSIS — J45909 Unspecified asthma, uncomplicated: Secondary | ICD-10-CM | POA: Insufficient documentation

## 2014-09-14 DIAGNOSIS — Z7982 Long term (current) use of aspirin: Secondary | ICD-10-CM | POA: Insufficient documentation

## 2014-09-14 DIAGNOSIS — Z9889 Other specified postprocedural states: Secondary | ICD-10-CM | POA: Insufficient documentation

## 2014-09-14 DIAGNOSIS — Z9079 Acquired absence of other genital organ(s): Secondary | ICD-10-CM | POA: Insufficient documentation

## 2014-09-14 DIAGNOSIS — Z79899 Other long term (current) drug therapy: Secondary | ICD-10-CM | POA: Insufficient documentation

## 2014-09-14 DIAGNOSIS — M549 Dorsalgia, unspecified: Secondary | ICD-10-CM | POA: Insufficient documentation

## 2014-09-14 DIAGNOSIS — I10 Essential (primary) hypertension: Secondary | ICD-10-CM | POA: Insufficient documentation

## 2014-09-14 LAB — CBC WITH DIFFERENTIAL/PLATELET
Band Neutrophils: 3 % (ref 0–10)
Basophils Absolute: 0 10*3/uL (ref 0.0–0.1)
Basophils Relative: 0 % (ref 0–1)
EOS ABS: 0.1 10*3/uL (ref 0.0–0.7)
EOS PCT: 1 % (ref 0–5)
HCT: 39.3 % (ref 36.0–46.0)
HEMOGLOBIN: 12.8 g/dL (ref 12.0–15.0)
Lymphocytes Relative: 35 % (ref 12–46)
Lymphs Abs: 1.9 10*3/uL (ref 0.7–4.0)
MCH: 28 pg (ref 26.0–34.0)
MCHC: 32.6 g/dL (ref 30.0–36.0)
MCV: 86 fL (ref 78.0–100.0)
Metamyelocytes Relative: 2 %
Monocytes Absolute: 0.3 10*3/uL (ref 0.1–1.0)
Monocytes Relative: 5 % (ref 3–12)
NEUTROS PCT: 54 % (ref 43–77)
Neutro Abs: 3.1 10*3/uL (ref 1.7–7.7)
PLATELETS: ADEQUATE 10*3/uL (ref 150–400)
RBC: 4.57 MIL/uL (ref 3.87–5.11)
RDW: 14.8 % (ref 11.5–15.5)
WBC: 5.4 10*3/uL (ref 4.0–10.5)

## 2014-09-14 LAB — URINALYSIS, ROUTINE W REFLEX MICROSCOPIC
BILIRUBIN URINE: NEGATIVE
Glucose, UA: >1000 mg/dL — AB
KETONES UR: NEGATIVE mg/dL
LEUKOCYTES UA: NEGATIVE
NITRITE: NEGATIVE
Protein, ur: 100 mg/dL — AB
Specific Gravity, Urine: 1.028 (ref 1.005–1.030)
UROBILINOGEN UA: 0.2 mg/dL (ref 0.0–1.0)
pH: 6.5 (ref 5.0–8.0)

## 2014-09-14 LAB — URINE MICROSCOPIC-ADD ON

## 2014-09-14 LAB — COMPREHENSIVE METABOLIC PANEL
ALT: 26 U/L (ref 14–54)
AST: 29 U/L (ref 15–41)
Albumin: 4.3 g/dL (ref 3.5–5.0)
Alkaline Phosphatase: 100 U/L (ref 38–126)
Anion gap: 13 (ref 5–15)
BILIRUBIN TOTAL: 0.4 mg/dL (ref 0.3–1.2)
BUN: 10 mg/dL (ref 6–20)
CO2: 25 mmol/L (ref 22–32)
Calcium: 9.5 mg/dL (ref 8.9–10.3)
Chloride: 99 mmol/L — ABNORMAL LOW (ref 101–111)
Creatinine, Ser: 0.9 mg/dL (ref 0.44–1.00)
GFR calc Af Amer: >60 mL/min (ref >60)
GLUCOSE: 358 mg/dL — AB (ref 65–99)
Potassium: 3.8 mmol/L (ref 3.5–5.1)
SODIUM: 137 mmol/L (ref 135–145)
Total Protein: 7.9 g/dL (ref 6.5–8.1)

## 2014-09-14 LAB — WET PREP, GENITAL
Trich, Wet Prep: NONE SEEN
YEAST WET PREP: NONE SEEN

## 2014-09-14 LAB — OCCULT BLOOD X 1 CARD TO LAB, STOOL: FECAL OCCULT BLD: NEGATIVE

## 2014-09-14 MED ORDER — IOHEXOL 300 MG/ML  SOLN
50.0000 mL | Freq: Once | INTRAMUSCULAR | Status: AC | PRN
  Administered 2014-09-14: 50 mL via ORAL

## 2014-09-14 MED ORDER — IOHEXOL 300 MG/ML  SOLN
100.0000 mL | Freq: Once | INTRAMUSCULAR | Status: AC | PRN
  Administered 2014-09-14: 100 mL via INTRAVENOUS

## 2014-09-14 MED ORDER — METRONIDAZOLE 500 MG PO TABS: 500.0000 mg | ORAL_TABLET | Freq: Two times a day (BID) | ORAL | Status: DC

## 2014-09-14 NOTE — ED Provider Notes (Signed)
CSN: [id number]     Arrival date & time 09/14/14  1459 History   First MD Initiated Contact with Patient 09/14/14 1526     Chief Complaint  Patient presents with  . Abdominal Pain     (Consider location/radiation/quality/duration/timing/severity/associated sxs/prior Treatment) Patient is a 49 y.o. female presenting with abdominal pain. The history is provided by the patient. No language interpreter was used.  Abdominal Pain Pain location:  Generalized Pain quality: aching   Pain radiates to:  Back Pain severity:  Moderate Onset quality:  Gradual Duration:  1 day Timing:  Constant Context: not alcohol use   Relieved by:  Nothing Worsened by:  Nothing tried Ineffective treatments:  None tried Associated symptoms: no diarrhea, no fever and no hematuria     Past Medical History  Diagnosis Date  . Diabetes mellitus   . Hypertension   . Asthma    Past Surgical History  Procedure Laterality Date  . Vein surgery    . Uterine ablation    . Laparoscopic assisted vaginal hysterectomy  02/29/2012    Procedure: LAPAROSCOPIC ASSISTED VAGINAL HYSTERECTOMY;  Surgeon: Cheri Fowler, MD;  Location: [facility];  Service: Gynecology;  Laterality: N/A;  . Salpingoophorectomy  02/29/2012    Procedure: SALPINGO OOPHORECTOMY;  Surgeon: Cheri Fowler, MD;  Location: [facility];  Service: Gynecology;  Laterality: Bilateral;  . Cystoscopy  02/29/2012    Procedure: CYSTOSCOPY;  Surgeon: Cheri Fowler, MD;  Location: [facility];  Service: Gynecology;  Laterality: N/A;   History reviewed. No pertinent family history. History  Substance Use Topics  . Smoking status: Never Smoker   . Smokeless tobacco: Never Used  . Alcohol Use: No   OB History    No data available     Review of Systems  Constitutional: Negative for fever.  Gastrointestinal: Positive for abdominal pain. Negative for diarrhea.  Genitourinary: Negative for hematuria.  Musculoskeletal: Positive for back pain.  All other systems  reviewed and are negative.     Allergies  Lisinopril  Home Medications   Prior to Admission medications   Medication Sig Start Date End Date Taking? Authorizing Provider  ALPRAZolam (XANAX) 0.25 MG tablet Take 0.25 mg by mouth at bedtime as needed. For anxiety    Historical Provider, MD  aspirin 81 MG tablet Take 81 mg by mouth daily.    Historical Provider, MD  Cholecalciferol (VITAMIN D PO) Take 1 tablet by mouth daily.    Historical Provider, MD  cyclobenzaprine (FLEXERIL) 10 MG tablet Take 10 mg by mouth at bedtime as needed. For back spasm    Historical Provider, MD  estradiol (ESTRACE) 1 MG tablet Take 1 mg by mouth daily.    Historical Provider, MD  FLUoxetine (PROZAC) 10 MG capsule Take 10 mg by mouth daily.    Historical Provider, MD  glipiZIDE (GLUCOTROL) 10 MG tablet Take 10 mg by mouth daily before breakfast.    Historical Provider, MD  hydrochlorothiazide (HYDRODIURIL) 25 MG tablet Take 25 mg by mouth daily.    Historical Provider, MD  losartan (COZAAR) 100 MG tablet Take 100 mg by mouth daily.    Historical Provider, MD  meloxicam (MOBIC) 15 MG tablet Take 15 mg by mouth daily.    Historical Provider, MD  metFORMIN (GLUCOPHAGE) 500 MG tablet Take 500 mg by mouth 2 (two) times daily.    Historical Provider, MD  naproxen sodium (ANAPROX) 550 MG tablet Take 550 mg by mouth 2 (two) times daily as needed. For pain. Does not take while  taking diclofenac.    Historical Provider, MD   BP 153/93 mmHg  Pulse 79  Temp(Src) 98.3 F (36.8 C) (Oral)  Resp 20  Ht 5\' 6"  (1.676 m)  Wt 300 lb (136.079 kg)  BMI 48.44 kg/m2  SpO2 98%  LMP 02/16/2012 Physical Exam  Constitutional: She appears well-developed and well-nourished.  HENT:  Head: Normocephalic and atraumatic.  Right Ear: External ear normal.  Left Ear: External ear normal.  Nose: Nose normal.  Mouth/Throat: Oropharynx is clear and moist.  Eyes: Conjunctivae are normal. Pupils are equal, round, and reactive to light.   Neck: Normal range of motion. Neck supple.  Cardiovascular: Normal rate.   Pulmonary/Chest: Effort normal.  Abdominal: Soft. There is no tenderness.  Genitourinary: Vaginal discharge found.  Vaginal discharge,  Thick white,    Musculoskeletal: Normal range of motion.  Skin: Skin is warm.    ED Course  Procedures (including critical care time) Labs Review Labs Reviewed  WET PREP, GENITAL - Abnormal; Notable for the following:    Clue Cells Wet Prep HPF POC TOO NUMEROUS TO COUNT (*)    WBC, Wet Prep HPF POC FEW (*)    All other components within normal limits  COMPREHENSIVE METABOLIC PANEL - Abnormal; Notable for the following:    Chloride 99 (*)    Glucose, Bld 358 (*)    All other components within normal limits  URINALYSIS, ROUTINE W REFLEX MICROSCOPIC (NOT AT [facility]) - Abnormal; Notable for the following:    Glucose, UA >1000 (*)    Hgb urine dipstick SMALL (*)    Protein, ur 100 (*)    All other components within normal limits  URINE MICROSCOPIC-ADD ON - Abnormal; Notable for the following:    Bacteria, UA FEW (*)    All other components within normal limits  OCCULT BLOOD X 1 CARD TO LAB, STOOL  CBC WITH DIFFERENTIAL/PLATELET  GC/CHLAMYDIA PROBE AMP ([place]) NOT AT [facility]    Imaging Review Ct Abdomen Pelvis W Contrast  09/14/2014   CLINICAL DATA:  Right flank pain radiating to the right lower quadrant. Nausea, diarrhea.  EXAM: CT ABDOMEN AND PELVIS WITH CONTRAST  TECHNIQUE: Multidetector CT imaging of the abdomen and pelvis was performed using the standard protocol following bolus administration of intravenous contrast.  CONTRAST:  163mL OMNIPAQUE IOHEXOL 300 MG/ML SOLN, 51mL OMNIPAQUE IOHEXOL 300 MG/ML SOLN  COMPARISON:  December 05, 2012  FINDINGS: There is diffuse fatty infiltration of liver. There is no focal liver lesion. The spleen, pancreas, gallbladder, adrenal glands and kidneys are normal. There is no hydronephrosis bilaterally. The aorta is normal. There is no  abdominal lymphadenopathy. There is no small bowel obstruction or diverticulitis. The appendix is normal.  Fluid-filled bladder is normal. The patient is status post prior hysterectomy. The lung bases are clear. Mild degenerative joint changes of the spine are noted.  IMPRESSION: No acute abnormality identified in the abdomen and pelvis. Fatty infiltration of liver.   Electronically Signed   By: Abelardo Diesel M.D.   On: 09/14/2014 18:16     EKG Interpretation None      MDM Pt counseled on results.     Final diagnoses:  RLQ abdominal pain  BV (bacterial vaginosis)    See your Md for recheck this week Flagyl 500mg  one po bid AVS    Fransico Meadow, PA-C 09/14/14 1914  Charlesetta Shanks, MD 09/14/14 2329

## 2014-09-14 NOTE — ED Notes (Signed)
Pts reports vaginal bleeding, right flank pain since this morning.

## 2014-09-14 NOTE — Discharge Instructions (Signed)
Abdominal Pain Many things can cause abdominal pain. Usually, abdominal pain is not caused by a disease and will improve without treatment. It can often be observed and treated at home. Your health care provider will do a physical exam and possibly order blood tests and X-rays to help determine the seriousness of your pain. However, in many cases, more time must pass before a clear cause of the pain can be found. Before that point, your health care provider may not know if you need more testing or further treatment. HOME CARE INSTRUCTIONS  Monitor your abdominal pain for any changes. The following actions may help to alleviate any discomfort you are experiencing:  Only take over-the-counter or prescription medicines as directed by your health care provider.  Do not take laxatives unless directed to do so by your health care provider.  Try a clear liquid diet (broth, tea, or water) as directed by your health care provider. Slowly move to a bland diet as tolerated. SEEK MEDICAL CARE IF:  You have unexplained abdominal pain.  You have abdominal pain associated with nausea or diarrhea.  You have pain when you urinate or have a bowel movement.  You experience abdominal pain that wakes you in the night.  You have abdominal pain that is worsened or improved by eating food.  You have abdominal pain that is worsened with eating fatty foods.  You have a fever. SEEK IMMEDIATE MEDICAL CARE IF:   Your pain does not go away within 2 hours.  You keep throwing up (vomiting).  Your pain is felt only in portions of the abdomen, such as the right side or the left lower portion of the abdomen.  You pass bloody or black tarry stools. MAKE SURE YOU:  Understand these instructions.   Will watch your condition.   Will get help right away if you are not doing well or get worse.  Document Released: 12/08/2004 Document Revised: 03/05/2013 Document Reviewed: 11/07/2012 [facility] Patient Information  2015 [employer], [place]. This information is not intended to replace advice given to you by your health care provider. Make sure you discuss any questions you have with your health care provider. Bacterial Vaginosis Bacterial vaginosis is a vaginal infection that occurs when the normal balance of bacteria in the vagina is disrupted. It results from an overgrowth of certain bacteria. This is the most common vaginal infection in women of childbearing age. Treatment is important to prevent complications, especially in pregnant women, as it can cause a premature delivery. CAUSES  Bacterial vaginosis is caused by an increase in harmful bacteria that are normally present in smaller amounts in the vagina. Several different kinds of bacteria can cause bacterial vaginosis. However, the reason that the condition develops is not fully understood. RISK FACTORS Certain activities or behaviors can put you at an increased risk of developing bacterial vaginosis, including:  Having a new sex partner or multiple sex partners.  Douching.  Using an intrauterine device (IUD) for contraception. Women do not get bacterial vaginosis from toilet seats, bedding, swimming pools, or contact with objects around them. SIGNS AND SYMPTOMS  Some women with bacterial vaginosis have no signs or symptoms. Common symptoms include:  Grey vaginal discharge.  A fishlike odor with discharge, especially after sexual intercourse.  Itching or burning of the vagina and vulva.  Burning or pain with urination. DIAGNOSIS  Your health care provider will take a medical history and examine the vagina for signs of bacterial vaginosis. A sample of vaginal fluid may be taken.  Your health care provider will look at this sample under a microscope to check for bacteria and abnormal cells. A vaginal pH test may also be done.  TREATMENT  Bacterial vaginosis may be treated with antibiotic medicines. These may be given in the form of a pill or a vaginal  cream. A second round of antibiotics may be prescribed if the condition comes back after treatment.  HOME CARE INSTRUCTIONS   Only take over-the-counter or prescription medicines as directed by your health care provider.  If antibiotic medicine was prescribed, take it as directed. Make sure you finish it even if you start to feel better.  Do not have sex until treatment is completed.  Tell all sexual partners that you have a vaginal infection. They should see their health care provider and be treated if they have problems, such as a mild rash or itching.  Practice safe sex by using condoms and only having one sex partner. SEEK MEDICAL CARE IF:   Your symptoms are not improving after 3 days of treatment.  You have increased discharge or pain.  You have a fever. MAKE SURE YOU:   Understand these instructions.  Will watch your condition.  Will get help right away if you are not doing well or get worse. FOR MORE INFORMATION  Centers for Disease Control and Prevention, Division of STD Prevention: [employer] American Sexual Health Association (ASHA): www.ashastd.org  Document Released: 02/28/2005 Document Revised: 12/19/2012 Document Reviewed: 10/10/2012 [facility] Patient Information 2015 [place], [place]. This information is not intended to replace advice given to you by your health care provider. Make sure you discuss any questions you have with your health care provider.

## 2014-09-16 LAB — GC/CHLAMYDIA PROBE AMP (~~LOC~~) NOT AT ARMC
Chlamydia: NEGATIVE
NEISSERIA GONORRHEA: NEGATIVE

## 2014-09-19 DIAGNOSIS — F411 Generalized anxiety disorder: Secondary | ICD-10-CM

## 2014-09-19 HISTORY — DX: Generalized anxiety disorder: F41.1

## 2014-11-04 DIAGNOSIS — J00 Acute nasopharyngitis [common cold]: Secondary | ICD-10-CM | POA: Insufficient documentation

## 2014-11-04 HISTORY — DX: Acute nasopharyngitis (common cold): J00

## 2014-11-24 DIAGNOSIS — J40 Bronchitis, not specified as acute or chronic: Secondary | ICD-10-CM

## 2014-11-24 HISTORY — DX: Bronchitis, not specified as acute or chronic: J40

## 2015-01-02 DIAGNOSIS — I4892 Unspecified atrial flutter: Secondary | ICD-10-CM

## 2015-01-02 DIAGNOSIS — I471 Supraventricular tachycardia, unspecified: Secondary | ICD-10-CM

## 2015-01-02 DIAGNOSIS — R0600 Dyspnea, unspecified: Secondary | ICD-10-CM | POA: Insufficient documentation

## 2015-01-02 DIAGNOSIS — R002 Palpitations: Secondary | ICD-10-CM | POA: Insufficient documentation

## 2015-01-02 DIAGNOSIS — R7989 Other specified abnormal findings of blood chemistry: Secondary | ICD-10-CM

## 2015-01-02 DIAGNOSIS — R739 Hyperglycemia, unspecified: Secondary | ICD-10-CM | POA: Insufficient documentation

## 2015-01-02 DIAGNOSIS — A419 Sepsis, unspecified organism: Secondary | ICD-10-CM

## 2015-01-02 HISTORY — DX: Dyspnea, unspecified: R06.00

## 2015-01-02 HISTORY — DX: Other specified abnormal findings of blood chemistry: R79.89

## 2015-01-02 HISTORY — DX: Hyperglycemia, unspecified: R73.9

## 2015-01-02 HISTORY — DX: Unspecified atrial flutter: I48.92

## 2015-01-02 HISTORY — DX: Supraventricular tachycardia, unspecified: I47.10

## 2015-01-02 HISTORY — DX: Supraventricular tachycardia: I47.1

## 2015-01-02 HISTORY — DX: Sepsis, unspecified organism: A41.9

## 2015-01-02 HISTORY — DX: Palpitations: R00.2

## 2015-04-06 ENCOUNTER — Observation Stay (HOSPITAL_BASED_OUTPATIENT_CLINIC_OR_DEPARTMENT_OTHER)
Admission: EM | Admit: 2015-04-06 | Discharge: 2015-04-08 | Disposition: A | Discharge status: Home / Self Care | Payer: Medicaid Other | Attending: Internal Medicine | Admitting: Internal Medicine

## 2015-04-06 ENCOUNTER — Encounter (HOSPITAL_BASED_OUTPATIENT_CLINIC_OR_DEPARTMENT_OTHER): Payer: Self-pay

## 2015-04-06 ENCOUNTER — Emergency Department (HOSPITAL_BASED_OUTPATIENT_CLINIC_OR_DEPARTMENT_OTHER): Payer: Medicaid Other

## 2015-04-06 ENCOUNTER — Emergency Department (HOSPITAL_COMMUNITY): Payer: Medicaid Other

## 2015-04-06 DIAGNOSIS — I4891 Unspecified atrial fibrillation: Secondary | ICD-10-CM | POA: Diagnosis not present

## 2015-04-06 DIAGNOSIS — J45909 Unspecified asthma, uncomplicated: Secondary | ICD-10-CM | POA: Diagnosis not present

## 2015-04-06 DIAGNOSIS — M5124 Other intervertebral disc displacement, thoracic region: Secondary | ICD-10-CM | POA: Insufficient documentation

## 2015-04-06 DIAGNOSIS — E039 Hypothyroidism, unspecified: Secondary | ICD-10-CM | POA: Diagnosis not present

## 2015-04-06 DIAGNOSIS — M5136 Other intervertebral disc degeneration, lumbar region: Secondary | ICD-10-CM | POA: Diagnosis present

## 2015-04-06 DIAGNOSIS — Z794 Long term (current) use of insulin: Secondary | ICD-10-CM | POA: Diagnosis not present

## 2015-04-06 DIAGNOSIS — M159 Polyosteoarthritis, unspecified: Secondary | ICD-10-CM | POA: Diagnosis not present

## 2015-04-06 DIAGNOSIS — E669 Obesity, unspecified: Secondary | ICD-10-CM | POA: Diagnosis present

## 2015-04-06 DIAGNOSIS — E785 Hyperlipidemia, unspecified: Secondary | ICD-10-CM | POA: Diagnosis present

## 2015-04-06 DIAGNOSIS — M545 Low back pain, unspecified: Secondary | ICD-10-CM | POA: Diagnosis present

## 2015-04-06 DIAGNOSIS — Z7901 Long term (current) use of anticoagulants: Secondary | ICD-10-CM | POA: Diagnosis not present

## 2015-04-06 DIAGNOSIS — F329 Major depressive disorder, single episode, unspecified: Secondary | ICD-10-CM | POA: Diagnosis not present

## 2015-04-06 DIAGNOSIS — F419 Anxiety disorder, unspecified: Secondary | ICD-10-CM | POA: Insufficient documentation

## 2015-04-06 DIAGNOSIS — J4520 Mild intermittent asthma, uncomplicated: Secondary | ICD-10-CM

## 2015-04-06 DIAGNOSIS — R531 Weakness: Principal | ICD-10-CM | POA: Insufficient documentation

## 2015-04-06 DIAGNOSIS — E1165 Type 2 diabetes mellitus with hyperglycemia: Secondary | ICD-10-CM | POA: Diagnosis not present

## 2015-04-06 DIAGNOSIS — E119 Type 2 diabetes mellitus without complications: Secondary | ICD-10-CM

## 2015-04-06 DIAGNOSIS — Z6841 Body Mass Index (BMI) 40.0 and over, adult: Secondary | ICD-10-CM | POA: Insufficient documentation

## 2015-04-06 DIAGNOSIS — R29898 Other symptoms and signs involving the musculoskeletal system: Secondary | ICD-10-CM

## 2015-04-06 DIAGNOSIS — G8929 Other chronic pain: Secondary | ICD-10-CM | POA: Diagnosis not present

## 2015-04-06 DIAGNOSIS — I480 Paroxysmal atrial fibrillation: Secondary | ICD-10-CM

## 2015-04-06 DIAGNOSIS — I10 Essential (primary) hypertension: Secondary | ICD-10-CM | POA: Insufficient documentation

## 2015-04-06 DIAGNOSIS — Z79899 Other long term (current) drug therapy: Secondary | ICD-10-CM | POA: Insufficient documentation

## 2015-04-06 HISTORY — DX: Obesity, unspecified: E66.9

## 2015-04-06 HISTORY — DX: Other intervertebral disc degeneration, lumbar region: M51.36

## 2015-04-06 HISTORY — DX: Unspecified atrial fibrillation: I48.91

## 2015-04-06 HISTORY — DX: Other intervertebral disc degeneration, lumbar region without mention of lumbar back pain or lower extremity pain: M51.369

## 2015-04-06 HISTORY — DX: Hypothyroidism, unspecified: E03.9

## 2015-04-06 HISTORY — DX: Hyperlipidemia, unspecified: E78.5

## 2015-04-06 LAB — URINALYSIS, ROUTINE W REFLEX MICROSCOPIC
Bilirubin Urine: NEGATIVE
Hgb urine dipstick: NEGATIVE
KETONES UR: NEGATIVE mg/dL
LEUKOCYTES UA: NEGATIVE
NITRITE: NEGATIVE
PROTEIN: NEGATIVE mg/dL
Specific Gravity, Urine: 1.027 (ref 1.005–1.030)
pH: 6 (ref 5.0–8.0)

## 2015-04-06 LAB — COMPREHENSIVE METABOLIC PANEL
ALK PHOS: 162 U/L — AB (ref 38–126)
ALT: 25 U/L (ref 14–54)
AST: 22 U/L (ref 15–41)
Albumin: 3.9 g/dL (ref 3.5–5.0)
Anion gap: 8 (ref 5–15)
BUN: 10 mg/dL (ref 6–20)
CALCIUM: 9.2 mg/dL (ref 8.9–10.3)
CO2: 26 mmol/L (ref 22–32)
CREATININE: 0.7 mg/dL (ref 0.44–1.00)
Chloride: 98 mmol/L — ABNORMAL LOW (ref 101–111)
Glucose, Bld: 474 mg/dL — ABNORMAL HIGH (ref 65–99)
Potassium: 3.7 mmol/L (ref 3.5–5.1)
SODIUM: 132 mmol/L — AB (ref 135–145)
Total Bilirubin: 0.2 mg/dL — ABNORMAL LOW (ref 0.3–1.2)
Total Protein: 8 g/dL (ref 6.5–8.1)

## 2015-04-06 LAB — RAPID URINE DRUG SCREEN, HOSP PERFORMED
Amphetamines: NOT DETECTED
BARBITURATES: NOT DETECTED
Benzodiazepines: NOT DETECTED
COCAINE: NOT DETECTED
Opiates: NOT DETECTED
Tetrahydrocannabinol: NOT DETECTED

## 2015-04-06 LAB — URINE MICROSCOPIC-ADD ON: RBC / HPF: NONE SEEN RBC/hpf (ref 0–5)

## 2015-04-06 LAB — DIFFERENTIAL
Basophils Absolute: 0 10*3/uL (ref 0.0–0.1)
Basophils Relative: 0 %
Eosinophils Absolute: 0.1 10*3/uL (ref 0.0–0.7)
Eosinophils Relative: 2 %
LYMPHS PCT: 25 %
Lymphs Abs: 1.4 10*3/uL (ref 0.7–4.0)
MONO ABS: 0.3 10*3/uL (ref 0.1–1.0)
MONOS PCT: 5 %
NEUTROS ABS: 3.8 10*3/uL (ref 1.7–7.7)
Neutrophils Relative %: 68 %

## 2015-04-06 LAB — CBC
HEMATOCRIT: 39.8 % (ref 36.0–46.0)
HEMOGLOBIN: 12.8 g/dL (ref 12.0–15.0)
MCH: 27.1 pg (ref 26.0–34.0)
MCHC: 32.2 g/dL (ref 30.0–36.0)
MCV: 84.3 fL (ref 78.0–100.0)
Platelets: 188 10*3/uL (ref 150–400)
RBC: 4.72 MIL/uL (ref 3.87–5.11)
RDW: 14.1 % (ref 11.5–15.5)
WBC: 5.6 10*3/uL (ref 4.0–10.5)

## 2015-04-06 LAB — PROTIME-INR
INR: 1.05 (ref 0.00–1.49)
Prothrombin Time: 13.9 seconds (ref 11.6–15.2)

## 2015-04-06 LAB — APTT: aPTT: 33 seconds (ref 24–37)

## 2015-04-06 LAB — ETHANOL: Alcohol, Ethyl (B): <5 mg/dL (ref <5)

## 2015-04-06 LAB — TROPONIN I: Troponin I: <0.03 ng/mL (ref <0.031)

## 2015-04-06 LAB — CBG MONITORING, ED: Glucose-Capillary: 318 mg/dL — ABNORMAL HIGH (ref 65–99)

## 2015-04-06 MED ORDER — LORAZEPAM 2 MG/ML IJ SOLN
1.0000 mg | INTRAMUSCULAR | Status: DC | PRN
  Administered 2015-04-06: 1 mg via INTRAVENOUS
  Filled 2015-04-06: qty 1

## 2015-04-06 MED ORDER — SODIUM CHLORIDE 0.9 % IV BOLUS (SEPSIS)
1000.0000 mL | Freq: Once | INTRAVENOUS | Status: AC
  Administered 2015-04-06: 1000 mL via INTRAVENOUS

## 2015-04-06 NOTE — ED Notes (Signed)
Pt arrives from [place] via [place], received report from [facility]. Pt c/o L sided weakness and gait problem, leading to fall. Pt alert x4, ambulatory to stretcher with stand by assistance.

## 2015-04-06 NOTE — ED Notes (Signed)
PA at bedside.

## 2015-04-06 NOTE — ED Notes (Signed)
Patient transported to CT 

## 2015-04-06 NOTE — ED Provider Notes (Addendum)
Pt seen on arrival.  Transferred from [place] at [facility] for Neuro consultation.  Pt describes LLE weakness since a fall 15 days ago. States occasionally her right feel weak, but is primarily her left. She walks with a cane. She feels like her "knee will just buckled" and get out she will fall she states is because of weakness, not  due to pain. Denies back pain. Describes some pain in her left hip, left shoulder, since that time.  On exam has poor effort but 4 out of 5 weakness left lower extremity. No upper extremity pronator drift. Symmetric intact cranial nerves. MRI ordered. Discussed briefly with neurology, Dr. Janann Colonel. He will see in consultation following MRI.   00:31:  MRI of the brain normal. Seen in consultation with neurology. Dr. Janann Colonel recommends MRI imaging of the cervical, thoracic, lumbar spine. He felt that admission would be safest course of action with therapy evaluation for gait training and regulations for home care and needs.      Tanna Furry, MD 04/06/15 2352  Tanna Furry, MD 04/07/15 [id number]  Tanna Furry, MD 04/07/15 0040

## 2015-04-06 NOTE — ED Notes (Addendum)
Pt first stated c/o since 1130am then states weakness to left arm and leg, "my knees buckle", back pain, left clavicle pain and left hip pain since a fall on Jan 8th-pt states she drove self here-is using umbrella as a cane

## 2015-04-06 NOTE — ED Provider Notes (Signed)
CSN: [id number]     Arrival date & time 04/06/15  1615 History   First MD Initiated Contact with Patient 04/06/15 1651     Chief Complaint  Patient presents with  . Weakness     (Consider location/radiation/quality/duration/timing/severity/associated sxs/prior Treatment) HPI   Tonya Quinn Is a 50 year old female who presents emergency Department with chief complaint of left-sided weakness. The patient states that she had a fall on January 8. She states she slipped and fell onto her right side on wet flooring. She denies hitting her head or losing consciousness. She states about 2.days after she began having weakness on the left side. She states she feels as if her leg and arm are giving out. She has a history of chronic knee, lumbar pain, left hip and left shoulder pain due to arthritis which is worse in the morning and better throughout the day. She states that she is afraid she will fall because of the weakness. She's been using a cane 100 and walking very slowly. Patient states that she came in today because her weakness seemed to be worse today. She denies any pain at this time. She states that her weakness seems to be somewhat better since she drove herself to the emergency department. She denies any other neurologic complaints such as changes in vision, headaches, vertigo, difficulty with speech or swallowing. She has a history of hypertension. She is on estrogen replacement therapy. She also takes Eliquis.  Past Medical History  Diagnosis Date  . Diabetes mellitus   . Hypertension   . Asthma   . DDD (degenerative disc disease), lumbar   . Obesity   . HLD (hyperlipidemia)   . Hypothyroidism   . Atrial fibrillation [facility])    Past Surgical History  Procedure Laterality Date  . Vein surgery    . Uterine ablation    . Laparoscopic assisted vaginal hysterectomy  02/29/2012    Procedure: LAPAROSCOPIC ASSISTED VAGINAL HYSTERECTOMY;  Surgeon: Cheri Fowler, MD;  Location: [facility];   Service: Gynecology;  Laterality: N/A;  . Salpingoophorectomy  02/29/2012    Procedure: SALPINGO OOPHORECTOMY;  Surgeon: Cheri Fowler, MD;  Location: [facility];  Service: Gynecology;  Laterality: Bilateral;  . Cystoscopy  02/29/2012    Procedure: CYSTOSCOPY;  Surgeon: Cheri Fowler, MD;  Location: [facility];  Service: Gynecology;  Laterality: N/A;  . Abdominal hysterectomy     Family History  Problem Relation Age of Onset  . Hypertension Brother   . Diabetes Brother    Social History  Substance Use Topics  . Smoking status: Never Smoker   . Smokeless tobacco: Never Used  . Alcohol Use: No   OB History    No data available     Review of Systems   Ten systems reviewed and are negative for acute change, except as noted in the HPI.   Allergies  Lisinopril  Home Medications   Prior to Admission medications   Medication Sig Start Date End Date Taking? Authorizing Provider  albuterol (PROVENTIL HFA;VENTOLIN HFA) 108 (90 Base) MCG/ACT inhaler Inhale into the lungs.   Yes Historical Provider, MD  ALPRAZolam Duanne Moron) 0.25 MG tablet Take 0.25 mg by mouth at bedtime as needed. For anxiety   Yes Historical Provider, MD  amLODipine (NORVASC) 10 MG tablet Take 10 mg by mouth daily.   Yes Historical Provider, MD  apixaban (ELIQUIS) 5 MG TABS tablet Take 5 mg by mouth 2 (two) times daily. 01/04/15  Yes Historical Provider, MD  atenolol (TENORMIN) 50 MG tablet  Take 25 mg by mouth daily.   Yes Historical Provider, MD  atorvastatin (LIPITOR) 40 MG tablet Take 40 mg by mouth every evening. 01/04/15  Yes Historical Provider, MD  buPROPion (WELLBUTRIN XL) 150 MG 24 hr tablet Take 150 mg by mouth daily.   Yes Historical Provider, MD  celecoxib (CELEBREX) 200 MG capsule Take 200 mg by mouth daily.   Yes Historical Provider, MD  Cholecalciferol (VITAMIN D PO) Take 1 tablet by mouth daily.   Yes Historical Provider, MD  cyclobenzaprine (FLEXERIL) 10 MG tablet Take 10 mg by mouth at bedtime as needed. For  back spasm   Yes Historical Provider, MD  glipiZIDE (GLUCOTROL) 10 MG tablet Take 10 mg by mouth daily before breakfast.   Yes Historical Provider, MD  Insulin Detemir (LEVEMIR [place]) Inject 20 Units into the skin at bedtime.    Yes Historical Provider, MD  levothyroxine (SYNTHROID, LEVOTHROID) 25 MCG tablet Take 25 mcg by mouth daily.   Yes Historical Provider, MD  losartan (COZAAR) 100 MG tablet Take 100 mg by mouth daily.   Yes Historical Provider, MD  naproxen sodium (ANAPROX) 550 MG tablet Take 550 mg by mouth 2 (two) times daily as needed. For pain. Does not take while taking diclofenac.   Yes Historical Provider, MD   BP 127/75 mmHg  Pulse 91  Temp(Src) 97.9 F (36.6 C) (Oral)  Resp 23  Ht 5\' 6"  (1.676 m)  Wt 136.079 kg  BMI 48.44 kg/m2  SpO2 94%  LMP 02/16/2012 Physical Exam  Constitutional: She is oriented to person, place, and time. She appears well-developed and well-nourished. No distress.  HENT:  Head: Normocephalic and atraumatic.  Mouth/Throat: Oropharynx is clear and moist.  Eyes: Conjunctivae and EOM are normal. Pupils are equal, round, and reactive to light. No scleral icterus.  No horizontal, vertical or rotational nystagmus  Neck: Normal range of motion. Neck supple.  Full active and passive ROM without pain No midline or paraspinal tenderness   Cardiovascular: Normal rate, regular rhythm and intact distal pulses.   Pulmonary/Chest: Effort normal and breath sounds normal. No respiratory distress. She has no wheezes. She has no rales.  Abdominal: Soft. Bowel sounds are normal. There is no tenderness. There is no rebound and no guarding.  Musculoskeletal: Normal range of motion.  Lymphadenopathy:    She has no cervical adenopathy.  Neurological: She is alert and oriented to person, place, and time. She has normal reflexes. No cranial nerve deficit. She exhibits normal muscle tone. Coordination normal.  Mental Status:  Alert, oriented, thought content appropriate.  Speech fluent without evidence of aphasia. Able to follow 2 step commands without difficulty.  Cranial Nerves:  II:  Peripheral visual fields grossly normal, pupils equal, round, reactive to light III,IV, VI: ptosis not present, extra-ocular motions intact bilaterally  V,VII: smile symmetric, facial light touch sensation equal VIII: hearing grossly normal bilaterally  IX,X: midline uvula rise  XI: bilateral shoulder shrug equal and strong XII: midline tongue extension  Motor: Poor overall effort Equal strength upper and lower extremities bilaterally including equal grip strength and dorsiflexion/plantar flexion Sensory: Pinprick and light touch normal in all extremities.  Deep Tendon Reflexes: 2+ and symmetric  Cerebellar: normal finger-to-nose with bilateral upper extremities Gait: Slow, shuffling gait  CV: distal pulses palpable throughout   Skin: Skin is warm and dry. No rash noted. She is not diaphoretic.  Psychiatric: She has a normal mood and affect. Her behavior is normal. Judgment and thought content normal.  Nursing note  and vitals reviewed.   ED Course  Procedures (including critical care time) Labs Review Labs Reviewed  COMPREHENSIVE METABOLIC PANEL - Abnormal; Notable for the following:    Sodium 132 (*)    Chloride 98 (*)    Glucose, Bld 474 (*)    Alkaline Phosphatase 162 (*)    Total Bilirubin 0.2 (*)    All other components within normal limits  URINALYSIS, ROUTINE W REFLEX MICROSCOPIC (NOT AT [facility]) - Abnormal; Notable for the following:    Glucose, UA >1000 (*)    All other components within normal limits  URINE MICROSCOPIC-ADD ON - Abnormal; Notable for the following:    Squamous Epithelial / LPF 0-5 (*)    Bacteria, UA RARE (*)    All other components within normal limits  LIPID PANEL - Abnormal; Notable for the following:    Cholesterol 216 (*)    LDL Cholesterol 132 (*)    All other components within normal limits  COMPREHENSIVE METABOLIC PANEL -  Abnormal; Notable for the following:    Glucose, Bld 453 (*)    Albumin 3.4 (*)    Alkaline Phosphatase 140 (*)    All other components within normal limits  CBG MONITORING, ED - Abnormal; Notable for the following:    Glucose-Capillary 318 (*)    All other components within normal limits  CBG MONITORING, ED - Abnormal; Notable for the following:    Glucose-Capillary 321 (*)    All other components within normal limits  ETHANOL  PROTIME-INR  APTT  CBC  DIFFERENTIAL  URINE RAPID DRUG SCREEN, HOSP PERFORMED  TROPONIN I  TSH  VITAMIN B12  CK  CBC  ANTINUCLEAR ANTIBODIES, IFA  HEMOGLOBIN A1C    Imaging Review Ct Head Wo Contrast  04/06/2015  CLINICAL DATA:  Left arm and leg weakness since 03/25/2015. EXAM: CT HEAD WITHOUT CONTRAST TECHNIQUE: Contiguous axial images were obtained from the base of the skull through the vertex without intravenous contrast. COMPARISON:  Brain MRI 11/13/2013.  Head CT scan 02/09/2013. FINDINGS: The brain appears normal without hemorrhage, infarct, mass lesion, mass effect, midline shift or abnormal extra-axial fluid collection. No hydrocephalus or pneumocephalus. The calvarium is intact. Imaged paranasal sinuses and mastoid air cells are clear. IMPRESSION: Negative head CT. Electronically Signed   By: Inge Rise M.D.   On: 04/06/2015 17:26   Mr Brain Wo Contrast  04/06/2015  CLINICAL DATA:  Initial evaluation for left-sided weakness. EXAM: MRI HEAD WITHOUT CONTRAST TECHNIQUE: Multiplanar, multiecho pulse sequences of the brain and surrounding structures were obtained without intravenous contrast. COMPARISON:  Prior CT from earlier the same day. FINDINGS: The moderately degraded by motion artifact. Cerebral volume within normal limits for patient age. No significant white matter disease appreciated. No foci of restricted diffusion to suggest acute or subacute infarction. No areas of chronic infarction identified. Major intracranial vascular flow voids  maintained. Gray-white matter differentiation preserved. No acute or chronic intracranial hemorrhage. No mass lesion, midline shift, or mass effect. No hydrocephalus. No extra-axial fluid collection. Craniocervical junction grossly normal. Pituitary gland within normal limits. No acute abnormality about the orbits. Paranasal sinuses are clear. No mastoid effusion. Inner ear structures grossly normal. Bone marrow signal intensity within normal limits. No scalp soft tissue abnormality. IMPRESSION: Normal brain MRI. Electronically Signed   By: Jeannine Boga M.D.   On: 04/06/2015 23:32   Mr Cervical Spine Wo Contrast  04/07/2015  CLINICAL DATA:  50 year old female with left-sided weakness. Subsequent encounter. EXAM: MRI CERVICAL, THORACIC AND LUMBAR  SPINE WITHOUT CONTRAST TECHNIQUE: Multiplanar and multiecho pulse sequences of the cervical spine, to include the craniocervical junction and cervicothoracic junction, and lumbar spine, were obtained without intravenous contrast. COMPARISON:  04/06/2015 brain MR. 04/06/2015 head CT. 02/09/2013 head CT and cervical spine CT. FINDINGS: MRI CERVICAL SPINE FINDINGS Exam is motion degraded. Cervical medullary junction unremarkable. No obvious focal cervical cord signal abnormality. No evidence of cervical disc herniation or significant spinal stenosis. Minimal C3-4 foraminal narrowing. 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 MR THORACIC SPINE FINDINGS Proper level assignment is not possible given the fact patient moved throughout the exam. As the patient has disc protrusions as discussed below, prior to any intervention, one may consider repeat thoracic spine MR with sedation to help avoid motion degradation and allow proper level assignment. Three level disc protrusions mid thoracic region, proper level assignment not possible as noted above. The superior disc protrusion is the left of midline, next lower disc  protrusion is centrally located and just inferior to this level is a right-sided disc protrusion. These protrusions are deforming the adjacent cord. No obvious cord signal abnormality. Incidentally noted is a 1 cm right renal cyst. MRI LUMBAR SPINE FINDINGS Exam is motion degraded. If the last fully open disc space is labeled L5-S1, the conus is located in upper L2 region. L3-4:  Minimal bulge laterally without nerve root compression. L4-5: Bulge with lateral extension greater to left touching but not compressing the exiting left L4 nerve root. No significant spinal stenosis. Facet degenerative changes. L5-S1: Facet degenerative changes. Minimal Schmorl's node deformity inferior endplate L5. IMPRESSION: MRI CERVICAL SPINE Exam is motion degraded. No obvious focal cervical cord signal abnormality. No evidence of cervical disc herniation or significant spinal stenosis. Minimal C3-4 foraminal narrowing. MR THORACIC SPINE Proper level assignment is not possible given the fact patient moved throughout the exam (sequences were repeated but remain motion degraded). As the patient has disc protrusions as discussed below, prior to any intervention, one may consider repeat thoracic spine MR with sedation to help avoid motion degradation and allow proper level assignment. Three level disc protrusions mid thoracic region, proper level assignment not possible as noted above. The superior disc protrusion is the left of midline, next lower disc protrusion is centrally located and just inferior to this level is a right-sided disc protrusion. These protrusions are deforming the adjacent cord. No obvious cord signal abnormality. MRI LUMBAR SPINE Exam is motion degraded. If the last fully open disc space is labeled L5-S1, the conus is located in upper L2 region. L3-4 minimal bulge laterally without nerve root compression. L4-5 bulge with lateral extension greater to left touching but not compressing the exiting left L4 nerve root. No  significant spinal stenosis. Facet degenerative changes. L5-S1 facet degenerative changes. Minimal Schmorl's node deformity inferior endplate L5. Electronically Signed   By: Genia Del M.D.   On: 04/07/2015 09:24   Mr Thoracic Spine Wo Contrast  04/07/2015  CLINICAL DATA:  50 year old female with left-sided weakness. Subsequent encounter. EXAM: MRI CERVICAL, THORACIC AND LUMBAR SPINE WITHOUT CONTRAST TECHNIQUE: Multiplanar and multiecho pulse sequences of the cervical spine, to include the craniocervical junction and cervicothoracic junction, and lumbar spine, were obtained without intravenous contrast. COMPARISON:  04/06/2015 brain MR. 04/06/2015 head CT. 02/09/2013 head CT and cervical spine CT. FINDINGS: MRI CERVICAL SPINE FINDINGS Exam is motion degraded. Cervical medullary junction unremarkable. No obvious focal cervical cord signal abnormality. No evidence of cervical disc herniation or significant spinal stenosis. Minimal C3-4 foraminal  narrowing. 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 MR THORACIC SPINE FINDINGS Proper level assignment is not possible given the fact patient moved throughout the exam. As the patient has disc protrusions as discussed below, prior to any intervention, one may consider repeat thoracic spine MR with sedation to help avoid motion degradation and allow proper level assignment. Three level disc protrusions mid thoracic region, proper level assignment not possible as noted above. The superior disc protrusion is the left of midline, next lower disc protrusion is centrally located and just inferior to this level is a right-sided disc protrusion. These protrusions are deforming the adjacent cord. No obvious cord signal abnormality. Incidentally noted is a 1 cm right renal cyst. MRI LUMBAR SPINE FINDINGS Exam is motion degraded. If the last fully open disc space is labeled L5-S1, the conus is located in upper L2 region. L3-4:   Minimal bulge laterally without nerve root compression. L4-5: Bulge with lateral extension greater to left touching but not compressing the exiting left L4 nerve root. No significant spinal stenosis. Facet degenerative changes. L5-S1: Facet degenerative changes. Minimal Schmorl's node deformity inferior endplate L5. IMPRESSION: MRI CERVICAL SPINE Exam is motion degraded. No obvious focal cervical cord signal abnormality. No evidence of cervical disc herniation or significant spinal stenosis. Minimal C3-4 foraminal narrowing. MR THORACIC SPINE Proper level assignment is not possible given the fact patient moved throughout the exam (sequences were repeated but remain motion degraded). As the patient has disc protrusions as discussed below, prior to any intervention, one may consider repeat thoracic spine MR with sedation to help avoid motion degradation and allow proper level assignment. Three level disc protrusions mid thoracic region, proper level assignment not possible as noted above. The superior disc protrusion is the left of midline, next lower disc protrusion is centrally located and just inferior to this level is a right-sided disc protrusion. These protrusions are deforming the adjacent cord. No obvious cord signal abnormality. MRI LUMBAR SPINE Exam is motion degraded. If the last fully open disc space is labeled L5-S1, the conus is located in upper L2 region. L3-4 minimal bulge laterally without nerve root compression. L4-5 bulge with lateral extension greater to left touching but not compressing the exiting left L4 nerve root. No significant spinal stenosis. Facet degenerative changes. L5-S1 facet degenerative changes. Minimal Schmorl's node deformity inferior endplate L5. Electronically Signed   By: Genia Del M.D.   On: 04/07/2015 09:24   Mr Lumbar Spine Wo Contrast  04/07/2015  CLINICAL DATA:  50 year old female with left-sided weakness. Subsequent encounter. EXAM: MRI CERVICAL, THORACIC AND  LUMBAR SPINE WITHOUT CONTRAST TECHNIQUE: Multiplanar and multiecho pulse sequences of the cervical spine, to include the craniocervical junction and cervicothoracic junction, and lumbar spine, were obtained without intravenous contrast. COMPARISON:  04/06/2015 brain MR. 04/06/2015 head CT. 02/09/2013 head CT and cervical spine CT. FINDINGS: MRI CERVICAL SPINE FINDINGS Exam is motion degraded. Cervical medullary junction unremarkable. No obvious focal cervical cord signal abnormality. No evidence of cervical disc herniation or significant spinal stenosis. Minimal C3-4 foraminal narrowing. 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 MR THORACIC SPINE FINDINGS Proper level assignment is not possible given the fact patient moved throughout the exam. As the patient has disc protrusions as discussed below, prior to any intervention, one may consider repeat thoracic spine MR with sedation to help avoid motion degradation and allow proper level assignment. Three level disc protrusions mid thoracic region, proper level assignment not possible as noted above.  The superior disc protrusion is the left of midline, next lower disc protrusion is centrally located and just inferior to this level is a right-sided disc protrusion. These protrusions are deforming the adjacent cord. No obvious cord signal abnormality. Incidentally noted is a 1 cm right renal cyst. MRI LUMBAR SPINE FINDINGS Exam is motion degraded. If the last fully open disc space is labeled L5-S1, the conus is located in upper L2 region. L3-4:  Minimal bulge laterally without nerve root compression. L4-5: Bulge with lateral extension greater to left touching but not compressing the exiting left L4 nerve root. No significant spinal stenosis. Facet degenerative changes. L5-S1: Facet degenerative changes. Minimal Schmorl's node deformity inferior endplate L5. IMPRESSION: MRI CERVICAL SPINE Exam is motion degraded. No  obvious focal cervical cord signal abnormality. No evidence of cervical disc herniation or significant spinal stenosis. Minimal C3-4 foraminal narrowing. MR THORACIC SPINE Proper level assignment is not possible given the fact patient moved throughout the exam (sequences were repeated but remain motion degraded). As the patient has disc protrusions as discussed below, prior to any intervention, one may consider repeat thoracic spine MR with sedation to help avoid motion degradation and allow proper level assignment. Three level disc protrusions mid thoracic region, proper level assignment not possible as noted above. The superior disc protrusion is the left of midline, next lower disc protrusion is centrally located and just inferior to this level is a right-sided disc protrusion. These protrusions are deforming the adjacent cord. No obvious cord signal abnormality. MRI LUMBAR SPINE Exam is motion degraded. If the last fully open disc space is labeled L5-S1, the conus is located in upper L2 region. L3-4 minimal bulge laterally without nerve root compression. L4-5 bulge with lateral extension greater to left touching but not compressing the exiting left L4 nerve root. No significant spinal stenosis. Facet degenerative changes. L5-S1 facet degenerative changes. Minimal Schmorl's node deformity inferior endplate L5. Electronically Signed   By: Genia Del M.D.   On: 04/07/2015 09:24   I have personally reviewed and evaluated these images and lab results as part of my medical decision-making.   EKG Interpretation   Date/Time:  Monday April 06 2015 20:50:08 EST Ventricular Rate:  77 PR Interval:  146 QRS Duration: 87 QT Interval:  430 QTC Calculation: 487 R Axis:   15 Text Interpretation:  Sinus rhythm Low voltage, precordial leads Abnormal  R-wave progression, early transition Borderline prolonged QT interval ED  PHYSICIAN INTERPRETATION AVAILABLE IN [facility] Confirmed by TEST,  Record  ([id number]) on 04/07/2015 7:07:25 AM      MDM   Final diagnoses:  Leg weakness    50 year old morbidly obese female here for complaint of unilateral weakness. Weakness has been present for nearly 12 days. She feels it has been worsening and it got particularly worse today which is what brought her in. She states it is resolving since she has arrived at the emergency department. Risk factors for stroke include history of hypertension. There is no appreciable unilateral weakness. On my examination or other neurologic deficit. Patient has poor overall effort.   Patient with very high blood glucose,  Negative head CT. I have spoken with Dr. Silverio Decamp who asks that the patient be transferred [facility] for a neurologist to evaluate the patient. I have given fluids and the discussed need for transfer with the patient who agrees with POC.   Dr. Tanna Furry will accept patient in Ed-Ed transfer. Patient stable prior to transfer without worsening condition.  Margarita Mail, PA-C 04/07/15 [place], MD 04/09/15 [phone]

## 2015-04-06 NOTE — ED Provider Notes (Signed)
ED ECG REPORT   Date: 04/06/2015  Rate: 85  Rhythm: normal sinus rhythm  QRS Axis: normal  Intervals: PR prolonged  ST/T Wave abnormalities: normal  Conduction Disutrbances:first-degree A-V block   Narrative Interpretation:   Old EKG Reviewed: none available  Link in epic not working for interpretation into muse  Alfonzo Beers, MD 04/09/15 [phone]

## 2015-04-06 NOTE — ED Notes (Signed)
Pt c/o L sided weakness since a fall on January 8th. Originally pt fell onto R side after slipping on water and falling. Since fall pt has had worsening L sided weakness and has been using a cane to ambulate. Pt with L sided leg drift only at this time. Also reports hyperglycemia, had been taking levemir, recently given humalog prescripting 1/18, unable to pick up insulin since prescribed

## 2015-04-07 ENCOUNTER — Inpatient Hospital Stay (HOSPITAL_COMMUNITY): Payer: Medicaid Other

## 2015-04-07 ENCOUNTER — Encounter (HOSPITAL_COMMUNITY): Payer: Self-pay | Admitting: Internal Medicine

## 2015-04-07 DIAGNOSIS — E669 Obesity, unspecified: Secondary | ICD-10-CM | POA: Diagnosis present

## 2015-04-07 DIAGNOSIS — R531 Weakness: Secondary | ICD-10-CM | POA: Diagnosis present

## 2015-04-07 DIAGNOSIS — E119 Type 2 diabetes mellitus without complications: Secondary | ICD-10-CM

## 2015-04-07 DIAGNOSIS — E039 Hypothyroidism, unspecified: Secondary | ICD-10-CM | POA: Diagnosis present

## 2015-04-07 DIAGNOSIS — J4520 Mild intermittent asthma, uncomplicated: Secondary | ICD-10-CM

## 2015-04-07 DIAGNOSIS — J45909 Unspecified asthma, uncomplicated: Secondary | ICD-10-CM | POA: Diagnosis present

## 2015-04-07 DIAGNOSIS — F419 Anxiety disorder, unspecified: Secondary | ICD-10-CM | POA: Diagnosis present

## 2015-04-07 DIAGNOSIS — I4891 Unspecified atrial fibrillation: Secondary | ICD-10-CM | POA: Diagnosis present

## 2015-04-07 DIAGNOSIS — I10 Essential (primary) hypertension: Secondary | ICD-10-CM | POA: Diagnosis present

## 2015-04-07 DIAGNOSIS — E785 Hyperlipidemia, unspecified: Secondary | ICD-10-CM

## 2015-04-07 DIAGNOSIS — M5136 Other intervertebral disc degeneration, lumbar region: Secondary | ICD-10-CM

## 2015-04-07 DIAGNOSIS — E1165 Type 2 diabetes mellitus with hyperglycemia: Secondary | ICD-10-CM

## 2015-04-07 DIAGNOSIS — M6289 Other specified disorders of muscle: Secondary | ICD-10-CM

## 2015-04-07 HISTORY — DX: Weakness: R53.1

## 2015-04-07 HISTORY — DX: Type 2 diabetes mellitus with hyperglycemia: E11.65

## 2015-04-07 HISTORY — DX: Type 2 diabetes mellitus without complications: E11.9

## 2015-04-07 HISTORY — DX: Anxiety disorder, unspecified: F41.9

## 2015-04-07 LAB — CBC
HEMATOCRIT: 37.7 % (ref 36.0–46.0)
Hemoglobin: 12.2 g/dL (ref 12.0–15.0)
MCH: 27.1 pg (ref 26.0–34.0)
MCHC: 32.4 g/dL (ref 30.0–36.0)
MCV: 83.8 fL (ref 78.0–100.0)
Platelets: 170 10*3/uL (ref 150–400)
RBC: 4.5 MIL/uL (ref 3.87–5.11)
RDW: 14 % (ref 11.5–15.5)
WBC: 5.3 10*3/uL (ref 4.0–10.5)

## 2015-04-07 LAB — COMPREHENSIVE METABOLIC PANEL
ALBUMIN: 3.4 g/dL — AB (ref 3.5–5.0)
ALT: 25 U/L (ref 14–54)
AST: 20 U/L (ref 15–41)
Alkaline Phosphatase: 140 U/L — ABNORMAL HIGH (ref 38–126)
Anion gap: 13 (ref 5–15)
BILIRUBIN TOTAL: 0.4 mg/dL (ref 0.3–1.2)
BUN: 7 mg/dL (ref 6–20)
CHLORIDE: 101 mmol/L (ref 101–111)
CO2: 23 mmol/L (ref 22–32)
Calcium: 9.2 mg/dL (ref 8.9–10.3)
Creatinine, Ser: 0.65 mg/dL (ref 0.44–1.00)
GFR calc Af Amer: >60 mL/min (ref >60)
GFR calc non Af Amer: >60 mL/min (ref >60)
GLUCOSE: 453 mg/dL — AB (ref 65–99)
POTASSIUM: 3.7 mmol/L (ref 3.5–5.1)
Sodium: 137 mmol/L (ref 135–145)
TOTAL PROTEIN: 7.1 g/dL (ref 6.5–8.1)

## 2015-04-07 LAB — GLUCOSE, CAPILLARY
Glucose-Capillary: 396 mg/dL — ABNORMAL HIGH (ref 65–99)
Glucose-Capillary: 424 mg/dL — ABNORMAL HIGH (ref 65–99)

## 2015-04-07 LAB — CBG MONITORING, ED
Glucose-Capillary: 321 mg/dL — ABNORMAL HIGH (ref 65–99)
Glucose-Capillary: 332 mg/dL — ABNORMAL HIGH (ref 65–99)

## 2015-04-07 LAB — TSH: TSH: 1.579 u[IU]/mL (ref 0.350–4.500)

## 2015-04-07 LAB — CK: CK TOTAL: 40 U/L (ref 38–234)

## 2015-04-07 LAB — VITAMIN B12: Vitamin B-12: 656 pg/mL (ref 180–914)

## 2015-04-07 LAB — LIPID PANEL
Cholesterol: 216 mg/dL — ABNORMAL HIGH (ref 0–200)
HDL: 56 mg/dL (ref >40)
LDL CALC: 132 mg/dL — AB (ref 0–99)
Total CHOL/HDL Ratio: 3.9 RATIO
Triglycerides: 139 mg/dL (ref <150)
VLDL: 28 mg/dL (ref 0–40)

## 2015-04-07 MED ORDER — INSULIN DETEMIR 100 UNIT/ML ~~LOC~~ SOLN
25.0000 [IU] | Freq: Every day | SUBCUTANEOUS | Status: DC
  Administered 2015-04-07 (×2): 25 [IU] via SUBCUTANEOUS
  Filled 2015-04-07 (×3): qty 0.25

## 2015-04-07 MED ORDER — ALBUTEROL SULFATE (2.5 MG/3ML) 0.083% IN NEBU: 2.5000 mg | INHALATION_SOLUTION | RESPIRATORY_TRACT | Status: DC | PRN

## 2015-04-07 MED ORDER — LOSARTAN POTASSIUM 50 MG PO TABS
100.0000 mg | ORAL_TABLET | Freq: Every day | ORAL | Status: DC
  Administered 2015-04-07 – 2015-04-08 (×2): 100 mg via ORAL
  Filled 2015-04-07 (×2): qty 2

## 2015-04-07 MED ORDER — INSULIN ASPART 100 UNIT/ML ~~LOC~~ SOLN
0.0000 [IU] | Freq: Three times a day (TID) | SUBCUTANEOUS | Status: DC
  Administered 2015-04-07 – 2015-04-08 (×3): 7 [IU] via SUBCUTANEOUS
  Filled 2015-04-07 (×2): qty 1

## 2015-04-07 MED ORDER — DM-GUAIFENESIN ER 30-600 MG PO TB12: 1.0000 | ORAL_TABLET | Freq: Two times a day (BID) | ORAL | Status: DC | PRN

## 2015-04-07 MED ORDER — ALPRAZOLAM 0.25 MG PO TABS: 0.2500 mg | ORAL_TABLET | Freq: Every evening | ORAL | Status: DC | PRN

## 2015-04-07 MED ORDER — ACETAMINOPHEN 325 MG PO TABS: 650.0000 mg | ORAL_TABLET | Freq: Four times a day (QID) | ORAL | Status: DC | PRN

## 2015-04-07 MED ORDER — NAPROXEN 250 MG PO TABS: 500.0000 mg | ORAL_TABLET | Freq: Two times a day (BID) | ORAL | Status: DC | PRN

## 2015-04-07 MED ORDER — INSULIN ASPART 100 UNIT/ML ~~LOC~~ SOLN
5.0000 [IU] | Freq: Once | SUBCUTANEOUS | Status: AC
  Administered 2015-04-07: 5 [IU] via SUBCUTANEOUS

## 2015-04-07 MED ORDER — SODIUM CHLORIDE 0.9 % IJ SOLN
3.0000 mL | Freq: Two times a day (BID) | INTRAMUSCULAR | Status: DC
  Administered 2015-04-07 – 2015-04-08 (×4): 3 mL via INTRAVENOUS

## 2015-04-07 MED ORDER — CYCLOBENZAPRINE HCL 10 MG PO TABS: 10.0000 mg | ORAL_TABLET | Freq: Every evening | ORAL | Status: DC | PRN

## 2015-04-07 MED ORDER — ATENOLOL 50 MG PO TABS
25.0000 mg | ORAL_TABLET | Freq: Every day | ORAL | Status: DC
  Administered 2015-04-07 – 2015-04-08 (×2): 25 mg via ORAL
  Filled 2015-04-07 (×2): qty 1

## 2015-04-07 MED ORDER — LEVOTHYROXINE SODIUM 25 MCG PO TABS
25.0000 ug | ORAL_TABLET | Freq: Every day | ORAL | Status: DC
  Administered 2015-04-07 – 2015-04-08 (×2): 25 ug via ORAL
  Filled 2015-04-07 (×3): qty 1

## 2015-04-07 MED ORDER — ATORVASTATIN CALCIUM 40 MG PO TABS
40.0000 mg | ORAL_TABLET | Freq: Every evening | ORAL | Status: DC
  Administered 2015-04-07: 40 mg via ORAL
  Filled 2015-04-07: qty 1

## 2015-04-07 MED ORDER — BUPROPION HCL ER (XL) 150 MG PO TB24
150.0000 mg | ORAL_TABLET | Freq: Every day | ORAL | Status: DC
  Administered 2015-04-07 – 2015-04-08 (×2): 150 mg via ORAL
  Filled 2015-04-07 (×2): qty 1

## 2015-04-07 MED ORDER — APIXABAN 5 MG PO TABS
5.0000 mg | ORAL_TABLET | Freq: Two times a day (BID) | ORAL | Status: DC
  Administered 2015-04-07 – 2015-04-08 (×3): 5 mg via ORAL
  Filled 2015-04-07 (×4): qty 1

## 2015-04-07 MED ORDER — VITAMIN D 1000 UNITS PO TABS
1000.0000 [IU] | ORAL_TABLET | Freq: Every day | ORAL | Status: DC
  Administered 2015-04-07 – 2015-04-08 (×2): 1000 [IU] via ORAL
  Filled 2015-04-07 (×2): qty 1

## 2015-04-07 MED ORDER — ACETAMINOPHEN 650 MG RE SUPP: 650.0000 mg | Freq: Four times a day (QID) | RECTAL | Status: DC | PRN

## 2015-04-07 MED ORDER — LORAZEPAM 2 MG/ML IJ SOLN
1.0000 mg | Freq: Once | INTRAMUSCULAR | Status: AC
  Administered 2015-04-07: 1 mg via INTRAVENOUS
  Filled 2015-04-07: qty 1

## 2015-04-07 NOTE — Progress Notes (Signed)
Pt CBG 396 ACHS,  Levemir 25u given as ordered no coverage for short acting, MD on call [facility] was paged put in an order of 5u novolog, given will continue to monitor

## 2015-04-07 NOTE — Progress Notes (Signed)
Pt arrived to unit via stretcher from ED. Pt was oriented to room, call bell system, and how to call nurse on phone if needed. Pt watched safety video and provider was notified of arrival to unit. ccmd was notified and pt was placed on tele box #[id number] . Pt in no apparent distress at this time. Will continue to monitor.

## 2015-04-07 NOTE — ED Notes (Signed)
Dr Niu at bedside 

## 2015-04-07 NOTE — ED Notes (Signed)
Neurologist at bedside. 

## 2015-04-07 NOTE — Progress Notes (Signed)
Report received from [place], [place] in ED. Awaiting patient's arrival to the unit.

## 2015-04-07 NOTE — ED Notes (Signed)
Pt ambulatory to restroom with steady gait, appears generally weak when ambulating back to room.

## 2015-04-07 NOTE — ED Notes (Signed)
Pt returned from MRI. Reports need to use restroom, patient climbing out of stretcher and gait unsteady at this time, asked pt to let this RN get her a bedside commode and to sit back down, patient politely refusing and continuing to walk into hallway, pt has fall risk band on and yellow socks, this RN assisted patient to restroom and discussed risk of falling with patient. Pt states "I'm not going to fall." Pt requiring one person assist to ambulate to restroom.

## 2015-04-07 NOTE — Consult Note (Signed)
Consult Reason for Consult: weakness Referring Physician: Dr Jeneen Rinks ED  CC: weakness  HPI: Tonya Quinn is an 50 y.o. female hx of DM, HTN, obesity presenting with 2 weeks of lower extremity weakness. Reports symptoms started after suffering a fall 2 weeks ago where she fell backwards onto her lumbar/sacral region. Reports since then having weakness in bilateral LE, worse on the left. She states symptoms have worsened to the point of having difficulty walking so she came to the ED. She does report having lower extremity weakness prior to fall but reports it is more worse since the fall.   MRI brain imaging reviewed, no acute process noted.   Past Medical History  Diagnosis Date  . Diabetes mellitus   . Hypertension   . Asthma   . DDD (degenerative disc disease), lumbar   . Obesity     Past Surgical History  Procedure Laterality Date  . Vein surgery    . Uterine ablation    . Laparoscopic assisted vaginal hysterectomy  02/29/2012    Procedure: LAPAROSCOPIC ASSISTED VAGINAL HYSTERECTOMY;  Surgeon: Cheri Fowler, MD;  Location: [facility];  Service: Gynecology;  Laterality: N/A;  . Salpingoophorectomy  02/29/2012    Procedure: SALPINGO OOPHORECTOMY;  Surgeon: Cheri Fowler, MD;  Location: [facility];  Service: Gynecology;  Laterality: Bilateral;  . Cystoscopy  02/29/2012    Procedure: CYSTOSCOPY;  Surgeon: Cheri Fowler, MD;  Location: [facility];  Service: Gynecology;  Laterality: N/A;  . Abdominal hysterectomy      History reviewed. No pertinent family history.  Social History:  reports that she has never smoked. She has never used smokeless tobacco. She reports that she does not drink alcohol or use illicit drugs.  Allergies  Allergen Reactions  . Lisinopril     Patient reports headache     Medications: I have reviewed the patient's current medications.  ROS: Out of a complete 14 system review, the patient complains of only the following symptoms, and all other reviewed systems  are negative. +weakness  Physical Examination: Filed Vitals:   04/06/15 2253 04/06/15 2300  BP: 129/72 158/85  Pulse: 67 80  Temp:    Resp:  16   Physical Exam  Constitutional: He appears well-developed and well-nourished.  Psych: Affect appropriate to situation Eyes: No scleral injection HENT: No OP obstrucion Head: Normocephalic.  Cardiovascular: Normal rate and regular rhythm.  Respiratory: Effort normal and breath sounds normal.  GI: Soft. Bowel sounds are normal. No distension. There is no tenderness.  Skin: WDI  Neurologic Examination Mental Status: Alert, oriented, thought content appropriate.  Speech fluent without evidence of aphasia.  Able to follow 3 step commands without difficulty. Cranial Nerves: II: funduscopic exam wnl bilaterally, visual fields grossly normal, pupils equal, round, reactive to light and accommodation III,IV, VI: ptosis not present, extra-ocular motions intact bilaterally V,VII: smile symmetric, facial light touch sensation normal bilaterally VIII: hearing normal bilaterally IX,X: gag reflex present XI: trapezius strength/neck flexion strength normal bilaterally XII: tongue strength normal  Motor: bilateral drift with correction of UE Right : Upper extremity    Left:     Upper extremity 4+/5 deltoid       4+/5 deltoid 5-/5 biceps      5-/5 biceps  5-/5 triceps      5-/5 triceps 5-/5 hand grip      5-/5 hand grip  Lower extremity     Lower extremity 4-/5 hip flexor      3/5 hip flexor 4+/5 quadricep  4-/5 quadriceps  5-/5 hamstrings     4-/5 hamstrings 5-/5 plantar flexion       4-/5 plantar flexion 5-/5 plantar extension     4-/5 plantar extension *note effort dependent weakness throughout Tone and bulk:normal tone throughout; no atrophy noted Sensory: Pinprick and light touch intact throughout, bilaterally Deep Tendon Reflexes: 1+ and symmetric throughout Plantars: Right: downgoing   Left: downgoing Cerebellar: normal  finger-to-nose, unable to test HTS Gait: deferred  Laboratory Studies:   Basic Metabolic Panel:  Recent Labs Lab 04/06/15 1730  NA 132*  K 3.7  CL 98*  CO2 26  GLUCOSE 474*  BUN 10  CREATININE 0.70  CALCIUM 9.2    Liver Function Tests:  Recent Labs Lab 04/06/15 1730  AST 22  ALT 25  ALKPHOS 162*  BILITOT 0.2*  PROT 8.0  ALBUMIN 3.9   No results for input(s): LIPASE, AMYLASE in the last 168 hours. No results for input(s): AMMONIA in the last 168 hours.  CBC:  Recent Labs Lab 04/06/15 1730  WBC 5.6  NEUTROABS 3.8  HGB 12.8  HCT 39.8  MCV 84.3  PLT 188    Cardiac Enzymes:  Recent Labs Lab 04/06/15 1730  TROPONINI <0.03    BNP: Invalid input(s): POCBNP  CBG:  Recent Labs Lab 04/06/15 2258  GLUCAP 318*    Microbiology: Results for orders placed or performed during the hospital encounter of 09/14/14  Wet prep, genital     Status: Abnormal   Collection Time: 09/14/14  3:00 PM  Result Value Ref Range Status   Yeast Wet Prep HPF POC NONE SEEN NONE SEEN Final   Trich, Wet Prep NONE SEEN NONE SEEN Final   Clue Cells Wet Prep HPF POC TOO NUMEROUS TO COUNT (A) NONE SEEN Final   WBC, Wet Prep HPF POC FEW (A) NONE SEEN Final    Coagulation Studies:  Recent Labs  04/06/15 1730  LABPROT 13.9  INR 1.05    Urinalysis:  Recent Labs Lab 04/06/15 1720  COLORURINE YELLOW  LABSPEC 1.027  PHURINE 6.0  GLUCOSEU >1000*  HGBUR NEGATIVE  BILIRUBINUR NEGATIVE  KETONESUR NEGATIVE  PROTEINUR NEGATIVE  NITRITE NEGATIVE  LEUKOCYTESUR NEGATIVE    Lipid Panel:  No results found for: CHOL, TRIG, HDL, CHOLHDL, VLDL, LDLCALC  HgbA1C: No results found for: HGBA1C  Urine Drug Screen:     Component Value Date/Time   LABOPIA NONE DETECTED 04/06/2015 1720   COCAINSCRNUR NONE DETECTED 04/06/2015 1720   LABBENZ NONE DETECTED 04/06/2015 1720   AMPHETMU NONE DETECTED 04/06/2015 1720   THCU NONE DETECTED 04/06/2015 1720   LABBARB NONE DETECTED  04/06/2015 1720    Alcohol Level:  Recent Labs Lab 04/06/15 1730  ETH <5    Other results:  Imaging: Ct Head Wo Contrast  04/06/2015  CLINICAL DATA:  Left arm and leg weakness since 03/25/2015. EXAM: CT HEAD WITHOUT CONTRAST TECHNIQUE: Contiguous axial images were obtained from the base of the skull through the vertex without intravenous contrast. COMPARISON:  Brain MRI 11/13/2013.  Head CT scan 02/09/2013. FINDINGS: The brain appears normal without hemorrhage, infarct, mass lesion, mass effect, midline shift or abnormal extra-axial fluid collection. No hydrocephalus or pneumocephalus. The calvarium is intact. Imaged paranasal sinuses and mastoid air cells are clear. IMPRESSION: Negative head CT. Electronically Signed   By: Inge Rise M.D.   On: 04/06/2015 17:26   Mr Brain Wo Contrast  04/06/2015  CLINICAL DATA:  Initial evaluation for left-sided weakness. EXAM: MRI HEAD WITHOUT CONTRAST TECHNIQUE: Multiplanar,  multiecho pulse sequences of the brain and surrounding structures were obtained without intravenous contrast. COMPARISON:  Prior CT from earlier the same day. FINDINGS: The moderately degraded by motion artifact. Cerebral volume within normal limits for patient age. No significant white matter disease appreciated. No foci of restricted diffusion to suggest acute or subacute infarction. No areas of chronic infarction identified. Major intracranial vascular flow voids maintained. Gray-white matter differentiation preserved. No acute or chronic intracranial hemorrhage. No mass lesion, midline shift, or mass effect. No hydrocephalus. No extra-axial fluid collection. Craniocervical junction grossly normal. Pituitary gland within normal limits. No acute abnormality about the orbits. Paranasal sinuses are clear. No mastoid effusion. Inner ear structures grossly normal. Bone marrow signal intensity within normal limits. No scalp soft tissue abnormality. IMPRESSION: Normal brain MRI.  Electronically Signed   By: Jeannine Boga M.D.   On: 04/06/2015 23:32     Assessment/Plan:  49y/o hx of DM, HTN, obesity presenting with bilateral LE (L>R) and LUE weakness which she reports started after a fall 2 weeks ago. Exam shows generalized weakness which appears effort dependent. As symptoms appear to correlate with a fall cannot rule out a spinal cord injury though based on exam suspect a functional/effort dependent component to her presentation  -agree with MRI imaging of spine -check TSH, B12, CK, ANA -rehab evaluation  Jim Like, DO [facility] [phone]  If 7pm- 7am, please page neurology on call as listed in [place]. 04/07/2015, 12:48 AM

## 2015-04-07 NOTE — ED Notes (Signed)
Informed Dr. Wynelle Cleveland that pt is clear from neurology per Dr. Silverio Decamp.

## 2015-04-07 NOTE — H&P (Signed)
[facility] History and Physical  Tonya Quinn [id number] DOB: 1965/08/21 DOA: 04/06/2015  Referring physician: ED physician PCP: No PCP Per Patient  Specialists:   Chief Complaint: Left-sided weakness  HPI: Tonya Quinn is a 50 y.o. female with PMH of hypertension, hyperlipidemia, diabetes mellitus, asthma, hypothyroidism, anxiety, obesity, chronic back pain, atrial fibrillation on Eliquis, who presents with left-sided weakness.  Patient reports that she had fall 2 weeks ago when she was walking, slipped and fell on the right side. Yesterday morning, she started having left arm and leg weakness, pain over the clavicle and left hip. She does not have vision changes, hearing loss, slurred speech. She reports that she had mild low abdominal pain and nausea, which have resolved currently. Patient does not have chest pain, shortness stress, symptoms of UTI  In ED, patient was found to have negative MRI of the brain and CT-head for acute abnormalities. INR 1.05, troponin negative, WBC 5.6, temperature normal, no tachycardia, electrolytes and renal function okay, blood sugar 474 with normal anion gap. Patient's admitted to inpatient for further evaluated treatment. Neurology was consulted.  EKG: Independently reviewed. QTC 487, early R-wave progression, sinus rhythm.  Where does patient live?   At home  Can patient participate in ADLs?  Yes   Review of Systems:   General: no fevers, chills, no changes in body weight, has fatigue HEENT: no blurry vision, hearing changes or sore throat Pulm: no dyspnea, coughing, wheezing CV: no chest pain, palpitations Abd: no nausea, vomiting, abdominal pain, diarrhea, constipation GU: no dysuria, burning on urination, increased urinary frequency, hematuria  Ext: no leg edema Neuro: has left sided unilateral weakness, no vision change or hearing loss Skin: no rash MSK: No muscle spasm, no deformity, no limitation of range of movement in  spin Heme: No easy bruising.  Travel history: No recent long distant travel.  Allergy:  Allergies  Allergen Reactions  . Lisinopril     Patient reports headache     Past Medical History  Diagnosis Date  . Diabetes mellitus   . Hypertension   . Asthma   . DDD (degenerative disc disease), lumbar   . Obesity   . HLD (hyperlipidemia)   . Hypothyroidism   . Atrial fibrillation [facility])     Past Surgical History  Procedure Laterality Date  . Vein surgery    . Uterine ablation    . Laparoscopic assisted vaginal hysterectomy  02/29/2012    Procedure: LAPAROSCOPIC ASSISTED VAGINAL HYSTERECTOMY;  Surgeon: Cheri Fowler, MD;  Location: [facility];  Service: Gynecology;  Laterality: N/A;  . Salpingoophorectomy  02/29/2012    Procedure: SALPINGO OOPHORECTOMY;  Surgeon: Cheri Fowler, MD;  Location: [facility];  Service: Gynecology;  Laterality: Bilateral;  . Cystoscopy  02/29/2012    Procedure: CYSTOSCOPY;  Surgeon: Cheri Fowler, MD;  Location: [facility];  Service: Gynecology;  Laterality: N/A;  . Abdominal hysterectomy      Social History:  reports that she has never smoked. She has never used smokeless tobacco. She reports that she does not drink alcohol or use illicit drugs.  Family History:  Family History  Problem Relation Age of Onset  . Hypertension Brother   . Diabetes Brother      Prior to Admission medications   Medication Sig Start Date End Date Taking? Authorizing Provider  albuterol (PROVENTIL HFA;VENTOLIN HFA) 108 (90 Base) MCG/ACT inhaler Inhale into the lungs.   Yes Historical Provider, MD  ALPRAZolam Duanne Moron) 0.25 MG tablet Take 0.25 mg by mouth at  bedtime as needed. For anxiety   Yes Historical Provider, MD  apixaban (ELIQUIS) 5 MG TABS tablet Take 5 mg by mouth 2 (two) times daily. 01/04/15  Yes Historical Provider, MD  atenolol (TENORMIN) 50 MG tablet Take 25 mg by mouth daily.   Yes Historical Provider, MD  atorvastatin (LIPITOR) 40 MG tablet Take 40 mg by mouth every  evening. 01/04/15  Yes Historical Provider, MD  buPROPion (WELLBUTRIN XL) 150 MG 24 hr tablet Take 150 mg by mouth daily.   Yes Historical Provider, MD  celecoxib (CELEBREX) 200 MG capsule Take 200 mg by mouth daily.   Yes Historical Provider, MD  Cholecalciferol (VITAMIN D PO) Take 1 tablet by mouth daily.   Yes Historical Provider, MD  cyclobenzaprine (FLEXERIL) 10 MG tablet Take 10 mg by mouth at bedtime as needed. For back spasm   Yes Historical Provider, MD  glipiZIDE (GLUCOTROL) 10 MG tablet Take 10 mg by mouth daily before breakfast.   Yes Historical Provider, MD  Insulin Detemir (LEVEMIR [place]) Inject 20 Units into the skin at bedtime.    Yes Historical Provider, MD  levothyroxine (SYNTHROID, LEVOTHROID) 25 MCG tablet Take 25 mcg by mouth daily.   Yes Historical Provider, MD  losartan (COZAAR) 100 MG tablet Take 100 mg by mouth daily.   Yes Historical Provider, MD  naproxen sodium (ANAPROX) 550 MG tablet Take 550 mg by mouth 2 (two) times daily as needed. For pain. Does not take while taking diclofenac.   Yes Historical Provider, MD    Physical Exam: Filed Vitals:   04/07/15 0300 04/07/15 0400 04/07/15 0500 04/07/15 0600  BP: 126/87 130/84 137/98 124/89  Pulse: 90 90 77 78  Temp:      TempSrc:      Resp: 16 19 18    Height:      Weight:      SpO2: 90% 91% 94% 94%   General: Not in acute distress HEENT:       Eyes: PERRL, EOMI, no scleral icterus.       ENT: No discharge from the ears and nose, no pharynx injection, no tonsillar enlargement.        Neck: No JVD, no bruit, no mass felt. Heme: No neck lymph node enlargement. Cardiac: S1/S2, RRR, No murmurs, No gallops or rubs. Pulm: No rales, wheezing, rhonchi or rubs. Abd: Soft, nondistended, nontender, no rebound pain, no organomegaly, BS present. Ext: No pitting leg edema bilaterally. 2+DP/PT pulse bilaterally. Musculoskeletal: No joint deformities, No joint redness or warmth, no limitation of ROM in spin. Skin: No rashes.   Neuro: Alert, oriented X3, cranial nerves II-XII grossly intact, muscle strength 3/5 in left arm and leg, sensation to light touch intact. Brachial reflex 2+ bilaterally. Knee reflex 1+ bilaterally. Negative Babinski's sign. Normal finger to nose test. Psych: Patient is not psychotic, no suicidal or hemocidal ideation.  Labs on Admission:  Basic Metabolic Panel:  Recent Labs Lab 04/06/15 1730 04/07/15 0125  NA 132* 137  K 3.7 3.7  CL 98* 101  CO2 26 23  GLUCOSE 474* 453*  BUN 10 7  CREATININE 0.70 0.65  CALCIUM 9.2 9.2   Liver Function Tests:  Recent Labs Lab 04/06/15 1730 04/07/15 0125  AST 22 20  ALT 25 25  ALKPHOS 162* 140*  BILITOT 0.2* 0.4  PROT 8.0 7.1  ALBUMIN 3.9 3.4*   No results for input(s): LIPASE, AMYLASE in the last 168 hours. No results for input(s): AMMONIA in the last 168 hours. CBC:  Recent Labs Lab 04/06/15 1730 04/07/15 0125  WBC 5.6 5.3  NEUTROABS 3.8  --   HGB 12.8 12.2  HCT 39.8 37.7  MCV 84.3 83.8  PLT 188 170   Cardiac Enzymes:  Recent Labs Lab 04/06/15 1730 04/07/15 0125  CKTOTAL  --  40  TROPONINI <0.03  --     BNP (last 3 results) No results for input(s): BNP in the last 8760 hours.  ProBNP (last 3 results) No results for input(s): PROBNP in the last 8760 hours.  CBG:  Recent Labs Lab 04/06/15 2258  GLUCAP 318*    Radiological Exams on Admission: Ct Head Wo Contrast  04/06/2015  CLINICAL DATA:  Left arm and leg weakness since 03/25/2015. EXAM: CT HEAD WITHOUT CONTRAST TECHNIQUE: Contiguous axial images were obtained from the base of the skull through the vertex without intravenous contrast. COMPARISON:  Brain MRI 11/13/2013.  Head CT scan 02/09/2013. FINDINGS: The brain appears normal without hemorrhage, infarct, mass lesion, mass effect, midline shift or abnormal extra-axial fluid collection. No hydrocephalus or pneumocephalus. The calvarium is intact. Imaged paranasal sinuses and mastoid air cells are clear.  IMPRESSION: Negative head CT. Electronically Signed   By: Inge Rise M.D.   On: 04/06/2015 17:26   Mr Brain Wo Contrast  04/06/2015  CLINICAL DATA:  Initial evaluation for left-sided weakness. EXAM: MRI HEAD WITHOUT CONTRAST TECHNIQUE: Multiplanar, multiecho pulse sequences of the brain and surrounding structures were obtained without intravenous contrast. COMPARISON:  Prior CT from earlier the same day. FINDINGS: The moderately degraded by motion artifact. Cerebral volume within normal limits for patient age. No significant white matter disease appreciated. No foci of restricted diffusion to suggest acute or subacute infarction. No areas of chronic infarction identified. Major intracranial vascular flow voids maintained. Gray-white matter differentiation preserved. No acute or chronic intracranial hemorrhage. No mass lesion, midline shift, or mass effect. No hydrocephalus. No extra-axial fluid collection. Craniocervical junction grossly normal. Pituitary gland within normal limits. No acute abnormality about the orbits. Paranasal sinuses are clear. No mastoid effusion. Inner ear structures grossly normal. Bone marrow signal intensity within normal limits. No scalp soft tissue abnormality. IMPRESSION: Normal brain MRI. Electronically Signed   By: Jeannine Boga M.D.   On: 04/06/2015 23:32    Assessment/Plan Principal Problem:   Left-sided weakness Active Problems:   LOW BACK PAIN, CHRONIC   Hypertension   Asthma   DDD (degenerative disc disease), lumbar   Obesity   Diabetes mellitus without complication (HCC)   HLD (hyperlipidemia)   Hypothyroidism   Anxiety   Essential hypertension   Atrial fibrillation (HCC)  Left-sided weakness: Etiology is not clear. MRI of brain and CT head are negative. Neurology was consulted, Dr. Janann Colonel saw patient and recommend to rule out spinal cord injury -will admit to tele bed -agree with MRI imaging of whole spine -check TSH, B12, CK, ANA per dr.  Janann Colonel -pt/ot  Atrial Fibrillation: CHA2DS2-VASc Score is 3, needs oral anticoagulation. Patient is on Eliquis at home. INR is 1.05 on admission. Heart rate is well controlled. -Continue Eliquis and atenolol  Hypothyroidism: Last TSH was not on record -Continue home Synthroid -Check TSH  HLD: Last LDL was not on record -Continue home medications: Lipitor -Check FLP  DM-II: Last A1c not on record. Patient is taking Levemir and the glipizide at home. Large sugar is elevated at 474 -will increase Levemir dose from 20-25 units daily -SSI -Check A1c  Asthma: Stable -prn Albuterol nebulizer  HTN:  -Atenolol, Cozaar  Depression and  Anxiety: Stable, no suicidal or homicidal ideations. -Continue home medications: When necessary Xanax and wellbutrin  LOW BACK PAIN, CHRONIC: -Celebrex, Flexeril and naproxen  DVT ppx: Eliquis  Code Status: Full code Family Communication: None at bed side.  Disposition Plan: Admit to inpatient   Date of Service 04/07/2015    Ivor Costa [facility] Pager [phone]  If 7PM-7AM, please contact night-coverage [email or web address] Password [facility] 04/07/2015, 6:59 AM

## 2015-04-07 NOTE — ED Notes (Signed)
Paged admitting MD for patient to speak to her.

## 2015-04-07 NOTE — ED Notes (Signed)
CBG 321 

## 2015-04-07 NOTE — ED Notes (Signed)
Meal placed at bedside; pt sleeping

## 2015-04-07 NOTE — Progress Notes (Signed)
PT Cancellation Note  Patient Details Name: Tonya Quinn MRN: [id number] DOB: [birth year]   Cancelled Treatment:    Reason Eval/Treat Not Completed: Patient at procedure or test/unavailable. At MRI for spine. Will await results prior to PT evaluation.   Fiorela Pelzer 04/07/2015, 8:24 AM  Pager [phone]

## 2015-04-07 NOTE — ED Notes (Signed)
Attempted report 

## 2015-04-07 NOTE — ED Notes (Signed)
Verbal order of ativan obtained for MRI

## 2015-04-07 NOTE — ED Notes (Addendum)
MD at bedside. 

## 2015-04-07 NOTE — ED Notes (Signed)
Pt attempting to climb out end of stretcher. Attempted to assist pt ambulate to the bathroom. Pt refused assistance - used umbrella as cane. Pt has fall risk armband and yellow socks, very unsteady at this time.

## 2015-04-08 DIAGNOSIS — R531 Weakness: Secondary | ICD-10-CM

## 2015-04-08 DIAGNOSIS — I4891 Unspecified atrial fibrillation: Secondary | ICD-10-CM

## 2015-04-08 HISTORY — DX: Weakness: R53.1

## 2015-04-08 LAB — GLUCOSE, CAPILLARY
GLUCOSE-CAPILLARY: 318 mg/dL — AB (ref 65–99)
GLUCOSE-CAPILLARY: 277 mg/dL — AB (ref 65–99)
Glucose-Capillary: 317 mg/dL — ABNORMAL HIGH (ref 65–99)
Glucose-Capillary: 344 mg/dL — ABNORMAL HIGH (ref 65–99)

## 2015-04-08 LAB — FANA STAINING PATTERNS
Homogeneous Pattern: 1:320 {titer} — ABNORMAL HIGH
Speckled Pattern: 1:160 {titer} — ABNORMAL HIGH

## 2015-04-08 LAB — HEMOGLOBIN A1C
HEMOGLOBIN A1C: 15.2 % — AB (ref 4.8–5.6)
Mean Plasma Glucose: 390 mg/dL

## 2015-04-08 LAB — ANTINUCLEAR ANTIBODIES, IFA: ANTINUCLEAR ANTIBODIES, IFA: POSITIVE — AB

## 2015-04-08 MED ORDER — INSULIN ASPART 100 UNIT/ML ~~LOC~~ SOLN
0.0000 [IU] | Freq: Three times a day (TID) | SUBCUTANEOUS | Status: DC
  Administered 2015-04-08 (×2): 11 [IU] via SUBCUTANEOUS

## 2015-04-08 MED ORDER — GLIPIZIDE 5 MG PO TABS
10.0000 mg | ORAL_TABLET | Freq: Every day | ORAL | Status: DC
Start: 2015-04-08 — End: 2015-04-08
  Administered 2015-04-08: 10 mg via ORAL
  Filled 2015-04-08: qty 2

## 2015-04-08 MED ORDER — INSULIN ASPART 100 UNIT/ML ~~LOC~~ SOLN
0.0000 [IU] | Freq: Every day | SUBCUTANEOUS | Status: DC
Start: 2015-04-08 — End: 2015-04-08

## 2015-04-08 MED ORDER — INSULIN DETEMIR 100 UNIT/ML ~~LOC~~ SOLN: 35.0000 [IU] | Freq: Every day | SUBCUTANEOUS | Status: DC

## 2015-04-08 NOTE — Progress Notes (Signed)
Paged case manager in regards to pt's discharge orders. Awaiting further communication.

## 2015-04-08 NOTE — Discharge Summary (Signed)
Physician Discharge Summary  Tonya Quinn [id number] DOB: 28-Dec-1965 DOA: 04/06/2015  PCP: No PCP Per Patient  Admit date: 04/06/2015 Discharge date: 04/08/2015  Time spent: 35 minutes  Recommendations for Outpatient Follow-up:  1. Closely follow sugars  Discharge Condition: stable   Discharge Diagnoses:  Principal Problem:   Left-sided weakness Active Problems:   LOW BACK PAIN, CHRONIC   Hypertension   Asthma   DDD (degenerative disc disease), lumbar   Obesity   Diabetes mellitus without complication (HCC)   HLD (hyperlipidemia)   Hypothyroidism   Anxiety   Essential hypertension   Atrial fibrillation (HCC)   History of present illness:  Tonya Quinn is a 50 y.o. female with PMH of hypertension, hyperlipidemia, diabetes mellitus, asthma, hypothyroidism, anxiety, obesity, chronic back pain, atrial fibrillation on Eliquis, who presents with left-sided weakness. She feels that her left arm is weak due to rotator cuff issues but left leg became weak as well after a fall about 2 weeks ago where she slipped.   Hospital Course:  Left sided weakness - MRI brain, Cervical, thoracic and lumbar spine do not reveal any abnormalities- weakness has resolved as well- ? TIA or muscle injury from recent fall- TSH, B12, CK normal - Neurology has been following as well and has no further recommendations - she has been ambulating in room without assistance per RN- OT and PT recommend home health which I have ordered  Uncontrolled DM 2 - sugars in 300s which she states is common for her- her Levemir was recently increased to 20 U and Novolog was added- we gave her 25 U of Levemir last night and AM sugar is still in 300s - will need to increase Levemir to 35 U and cont Novolog  Hyperlipidemia - LDL is 132- goal < 70, HDL 56 - cont statin  A-fib -cont Eliquis  Procedures:    Consultations:  Neurology   Discharge Exam: Filed Weights   04/06/15 1630  Weight: 136.079 kg (300  lb)   Filed Vitals:   04/08/15 0613 04/08/15 0900  BP: 110/71 134/79  Pulse:  81  Temp: 97.8 F (36.6 C) 98.6 F (37 C)  Resp: 18     General: AAO x 3, no distress Cardiovascular: RRR, no murmurs  Respiratory: clear to auscultation bilaterally GI: soft, non-tender, non-distended, bowel sound positive  Discharge Instructions You were cared for by a hospitalist during your hospital stay. If you have any questions about your discharge medications or the care you received while you were in the hospital after you are discharged, you can call the unit and asked to speak with the hospitalist on call if the hospitalist that took care of you is not available. Once you are discharged, your primary care physician will handle any further medical issues. Please note that NO REFILLS for any discharge medications will be authorized once you are discharged, as it is imperative that you return to your primary care physician (or establish a relationship with a primary care physician if you do not have one) for your aftercare needs so that they can reassess your need for medications and monitor your lab values.      Discharge Instructions    Discharge instructions    Complete by:  As directed   Diabetic heart healthy diet     Increase activity slowly    Complete by:  As directed             Medication List    STOP taking these medications  naproxen sodium 550 MG tablet  Commonly known as:  ANAPROX      TAKE these medications        albuterol 108 (90 Base) MCG/ACT inhaler  Commonly known as:  PROVENTIL HFA;VENTOLIN HFA  Inhale into the lungs.     ALPRAZolam 0.25 MG tablet  Commonly known as:  XANAX  Take 0.25 mg by mouth at bedtime as needed. For anxiety     amLODipine 10 MG tablet  Commonly known as:  NORVASC  Take 10 mg by mouth daily.     apixaban 5 MG Tabs tablet  Commonly known as:  ELIQUIS  Take 5 mg by mouth 2 (two) times daily.     atenolol 50 MG tablet  Commonly  known as:  TENORMIN  Take 25 mg by mouth daily.     atorvastatin 40 MG tablet  Commonly known as:  LIPITOR  Take 40 mg by mouth every evening.     buPROPion 150 MG 24 hr tablet  Commonly known as:  WELLBUTRIN XL  Take 150 mg by mouth daily.     celecoxib 200 MG capsule  Commonly known as:  CELEBREX  Take 200 mg by mouth daily.     cyclobenzaprine 10 MG tablet  Commonly known as:  FLEXERIL  Take 10 mg by mouth at bedtime as needed. For back spasm     glipiZIDE 10 MG tablet  Commonly known as:  GLUCOTROL  Take 10 mg by mouth daily before breakfast.     insulin detemir 100 UNIT/ML injection  Commonly known as:  LEVEMIR  Inject 0.35 mLs (35 Units total) into the skin at bedtime.     levothyroxine 25 MCG tablet  Commonly known as:  SYNTHROID, LEVOTHROID  Take 25 mcg by mouth daily.     losartan 100 MG tablet  Commonly known as:  COZAAR  Take 100 mg by mouth daily.     VITAMIN D PO  Take 1 tablet by mouth daily.       Allergies  Allergen Reactions  . Lisinopril     Patient reports headache       The results of significant diagnostics from this hospitalization (including imaging, microbiology, ancillary and laboratory) are listed below for reference.    Significant Diagnostic Studies: Ct Head Wo Contrast  04/06/2015  CLINICAL DATA:  Left arm and leg weakness since 03/25/2015. EXAM: CT HEAD WITHOUT CONTRAST TECHNIQUE: Contiguous axial images were obtained from the base of the skull through the vertex without intravenous contrast. COMPARISON:  Brain MRI 11/13/2013.  Head CT scan 02/09/2013. FINDINGS: The brain appears normal without hemorrhage, infarct, mass lesion, mass effect, midline shift or abnormal extra-axial fluid collection. No hydrocephalus or pneumocephalus. The calvarium is intact. Imaged paranasal sinuses and mastoid air cells are clear. IMPRESSION: Negative head CT. Electronically Signed   By: Inge Rise M.D.   On: 04/06/2015 17:26   Mr Brain Wo  Contrast  04/06/2015  CLINICAL DATA:  Initial evaluation for left-sided weakness. EXAM: MRI HEAD WITHOUT CONTRAST TECHNIQUE: Multiplanar, multiecho pulse sequences of the brain and surrounding structures were obtained without intravenous contrast. COMPARISON:  Prior CT from earlier the same day. FINDINGS: The moderately degraded by motion artifact. Cerebral volume within normal limits for patient age. No significant white matter disease appreciated. No foci of restricted diffusion to suggest acute or subacute infarction. No areas of chronic infarction identified. Major intracranial vascular flow voids maintained. Gray-white matter differentiation preserved. No acute or chronic intracranial hemorrhage. No mass lesion,  midline shift, or mass effect. No hydrocephalus. No extra-axial fluid collection. Craniocervical junction grossly normal. Pituitary gland within normal limits. No acute abnormality about the orbits. Paranasal sinuses are clear. No mastoid effusion. Inner ear structures grossly normal. Bone marrow signal intensity within normal limits. No scalp soft tissue abnormality. IMPRESSION: Normal brain MRI. Electronically Signed   By: Jeannine Boga M.D.   On: 04/06/2015 23:32   Mr Cervical Spine Wo Contrast  04/07/2015  CLINICAL DATA:  50 year old female with left-sided weakness. Subsequent encounter. EXAM: MRI CERVICAL, THORACIC AND LUMBAR SPINE WITHOUT CONTRAST TECHNIQUE: Multiplanar and multiecho pulse sequences of the cervical spine, to include the craniocervical junction and cervicothoracic junction, and lumbar spine, were obtained without intravenous contrast. COMPARISON:  04/06/2015 brain MR. 04/06/2015 head CT. 02/09/2013 head CT and cervical spine CT. FINDINGS: MRI CERVICAL SPINE FINDINGS Exam is motion degraded. Cervical medullary junction unremarkable. No obvious focal cervical cord signal abnormality. No evidence of cervical disc herniation or significant spinal stenosis. Minimal C3-4  foraminal narrowing. 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 MR THORACIC SPINE FINDINGS Proper level assignment is not possible given the fact patient moved throughout the exam. As the patient has disc protrusions as discussed below, prior to any intervention, one may consider repeat thoracic spine MR with sedation to help avoid motion degradation and allow proper level assignment. Three level disc protrusions mid thoracic region, proper level assignment not possible as noted above. The superior disc protrusion is the left of midline, next lower disc protrusion is centrally located and just inferior to this level is a right-sided disc protrusion. These protrusions are deforming the adjacent cord. No obvious cord signal abnormality. Incidentally noted is a 1 cm right renal cyst. MRI LUMBAR SPINE FINDINGS Exam is motion degraded. If the last fully open disc space is labeled L5-S1, the conus is located in upper L2 region. L3-4:  Minimal bulge laterally without nerve root compression. L4-5: Bulge with lateral extension greater to left touching but not compressing the exiting left L4 nerve root. No significant spinal stenosis. Facet degenerative changes. L5-S1: Facet degenerative changes. Minimal Schmorl's node deformity inferior endplate L5. IMPRESSION: MRI CERVICAL SPINE Exam is motion degraded. No obvious focal cervical cord signal abnormality. No evidence of cervical disc herniation or significant spinal stenosis. Minimal C3-4 foraminal narrowing. MR THORACIC SPINE Proper level assignment is not possible given the fact patient moved throughout the exam (sequences were repeated but remain motion degraded). As the patient has disc protrusions as discussed below, prior to any intervention, one may consider repeat thoracic spine MR with sedation to help avoid motion degradation and allow proper level assignment. Three level disc protrusions mid thoracic region, proper  level assignment not possible as noted above. The superior disc protrusion is the left of midline, next lower disc protrusion is centrally located and just inferior to this level is a right-sided disc protrusion. These protrusions are deforming the adjacent cord. No obvious cord signal abnormality. MRI LUMBAR SPINE Exam is motion degraded. If the last fully open disc space is labeled L5-S1, the conus is located in upper L2 region. L3-4 minimal bulge laterally without nerve root compression. L4-5 bulge with lateral extension greater to left touching but not compressing the exiting left L4 nerve root. No significant spinal stenosis. Facet degenerative changes. L5-S1 facet degenerative changes. Minimal Schmorl's node deformity inferior endplate L5. Electronically Signed   By: Genia Del M.D.   On: 04/07/2015 09:24   Mr Thoracic Spine Wo Contrast  04/07/2015  CLINICAL DATA:  50 year old female with left-sided weakness. Subsequent encounter. EXAM: MRI CERVICAL, THORACIC AND LUMBAR SPINE WITHOUT CONTRAST TECHNIQUE: Multiplanar and multiecho pulse sequences of the cervical spine, to include the craniocervical junction and cervicothoracic junction, and lumbar spine, were obtained without intravenous contrast. COMPARISON:  04/06/2015 brain MR. 04/06/2015 head CT. 02/09/2013 head CT and cervical spine CT. FINDINGS: MRI CERVICAL SPINE FINDINGS Exam is motion degraded. Cervical medullary junction unremarkable. No obvious focal cervical cord signal abnormality. No evidence of cervical disc herniation or significant spinal stenosis. Minimal C3-4 foraminal narrowing. 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 MR THORACIC SPINE FINDINGS Proper level assignment is not possible given the fact patient moved throughout the exam. As the patient has disc protrusions as discussed below, prior to any intervention, one may consider repeat thoracic spine MR with sedation to help avoid  motion degradation and allow proper level assignment. Three level disc protrusions mid thoracic region, proper level assignment not possible as noted above. The superior disc protrusion is the left of midline, next lower disc protrusion is centrally located and just inferior to this level is a right-sided disc protrusion. These protrusions are deforming the adjacent cord. No obvious cord signal abnormality. Incidentally noted is a 1 cm right renal cyst. MRI LUMBAR SPINE FINDINGS Exam is motion degraded. If the last fully open disc space is labeled L5-S1, the conus is located in upper L2 region. L3-4:  Minimal bulge laterally without nerve root compression. L4-5: Bulge with lateral extension greater to left touching but not compressing the exiting left L4 nerve root. No significant spinal stenosis. Facet degenerative changes. L5-S1: Facet degenerative changes. Minimal Schmorl's node deformity inferior endplate L5. IMPRESSION: MRI CERVICAL SPINE Exam is motion degraded. No obvious focal cervical cord signal abnormality. No evidence of cervical disc herniation or significant spinal stenosis. Minimal C3-4 foraminal narrowing. MR THORACIC SPINE Proper level assignment is not possible given the fact patient moved throughout the exam (sequences were repeated but remain motion degraded). As the patient has disc protrusions as discussed below, prior to any intervention, one may consider repeat thoracic spine MR with sedation to help avoid motion degradation and allow proper level assignment. Three level disc protrusions mid thoracic region, proper level assignment not possible as noted above. The superior disc protrusion is the left of midline, next lower disc protrusion is centrally located and just inferior to this level is a right-sided disc protrusion. These protrusions are deforming the adjacent cord. No obvious cord signal abnormality. MRI LUMBAR SPINE Exam is motion degraded. If the last fully open disc space is  labeled L5-S1, the conus is located in upper L2 region. L3-4 minimal bulge laterally without nerve root compression. L4-5 bulge with lateral extension greater to left touching but not compressing the exiting left L4 nerve root. No significant spinal stenosis. Facet degenerative changes. L5-S1 facet degenerative changes. Minimal Schmorl's node deformity inferior endplate L5. Electronically Signed   By: Genia Del M.D.   On: 04/07/2015 09:24   Mr Lumbar Spine Wo Contrast  04/07/2015  CLINICAL DATA:  50 year old female with left-sided weakness. Subsequent encounter. EXAM: MRI CERVICAL, THORACIC AND LUMBAR SPINE WITHOUT CONTRAST TECHNIQUE: Multiplanar and multiecho pulse sequences of the cervical spine, to include the craniocervical junction and cervicothoracic junction, and lumbar spine, were obtained without intravenous contrast. COMPARISON:  04/06/2015 brain MR. 04/06/2015 head CT. 02/09/2013 head CT and cervical spine CT. FINDINGS: MRI CERVICAL SPINE FINDINGS Exam is motion degraded. Cervical medullary junction unremarkable. No obvious focal cervical  cord signal abnormality. No evidence of cervical disc herniation or significant spinal stenosis. Minimal C3-4 foraminal narrowing. 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 MR THORACIC SPINE FINDINGS Proper level assignment is not possible given the fact patient moved throughout the exam. As the patient has disc protrusions as discussed below, prior to any intervention, one may consider repeat thoracic spine MR with sedation to help avoid motion degradation and allow proper level assignment. Three level disc protrusions mid thoracic region, proper level assignment not possible as noted above. The superior disc protrusion is the left of midline, next lower disc protrusion is centrally located and just inferior to this level is a right-sided disc protrusion. These protrusions are deforming the adjacent cord. No  obvious cord signal abnormality. Incidentally noted is a 1 cm right renal cyst. MRI LUMBAR SPINE FINDINGS Exam is motion degraded. If the last fully open disc space is labeled L5-S1, the conus is located in upper L2 region. L3-4:  Minimal bulge laterally without nerve root compression. L4-5: Bulge with lateral extension greater to left touching but not compressing the exiting left L4 nerve root. No significant spinal stenosis. Facet degenerative changes. L5-S1: Facet degenerative changes. Minimal Schmorl's node deformity inferior endplate L5. IMPRESSION: MRI CERVICAL SPINE Exam is motion degraded. No obvious focal cervical cord signal abnormality. No evidence of cervical disc herniation or significant spinal stenosis. Minimal C3-4 foraminal narrowing. MR THORACIC SPINE Proper level assignment is not possible given the fact patient moved throughout the exam (sequences were repeated but remain motion degraded). As the patient has disc protrusions as discussed below, prior to any intervention, one may consider repeat thoracic spine MR with sedation to help avoid motion degradation and allow proper level assignment. Three level disc protrusions mid thoracic region, proper level assignment not possible as noted above. The superior disc protrusion is the left of midline, next lower disc protrusion is centrally located and just inferior to this level is a right-sided disc protrusion. These protrusions are deforming the adjacent cord. No obvious cord signal abnormality. MRI LUMBAR SPINE Exam is motion degraded. If the last fully open disc space is labeled L5-S1, the conus is located in upper L2 region. L3-4 minimal bulge laterally without nerve root compression. L4-5 bulge with lateral extension greater to left touching but not compressing the exiting left L4 nerve root. No significant spinal stenosis. Facet degenerative changes. L5-S1 facet degenerative changes. Minimal Schmorl's node deformity inferior endplate L5.  Electronically Signed   By: Genia Del M.D.   On: 04/07/2015 09:24    Microbiology: No results found for this or any previous visit (from the past 240 hour(s)).   Labs: Basic Metabolic Panel:  Recent Labs Lab 04/06/15 1730 04/07/15 0125  NA 132* 137  K 3.7 3.7  CL 98* 101  CO2 26 23  GLUCOSE 474* 453*  BUN 10 7  CREATININE 0.70 0.65  CALCIUM 9.2 9.2   Liver Function Tests:  Recent Labs Lab 04/06/15 1730 04/07/15 0125  AST 22 20  ALT 25 25  ALKPHOS 162* 140*  BILITOT 0.2* 0.4  PROT 8.0 7.1  ALBUMIN 3.9 3.4*   No results for input(s): LIPASE, AMYLASE in the last 168 hours. No results for input(s): AMMONIA in the last 168 hours. CBC:  Recent Labs Lab 04/06/15 1730 04/07/15 0125  WBC 5.6 5.3  NEUTROABS 3.8  --   HGB 12.8 12.2  HCT 39.8 37.7  MCV 84.3 83.8  PLT 188 170   Cardiac Enzymes:  Recent Labs Lab 04/06/15 1730 04/07/15 0125  CKTOTAL  --  37  TROPONINI <0.03  --    BNP: BNP (last 3 results) No results for input(s): BNP in the last 8760 hours.  ProBNP (last 3 results) No results for input(s): PROBNP in the last 8760 hours.  CBG:  Recent Labs Lab 04/07/15 0943 04/07/15 1334 04/07/15 2208 04/07/15 2214 04/08/15 0802  GLUCAP 321* 332* 424* 396* 318*       SignedDebbe Odea, MD [facility] 04/08/2015, 11:21 AM

## 2015-04-08 NOTE — Evaluation (Signed)
Physical Therapy Evaluation Patient Details Name: Tonya Quinn MRN: [id number] DOB: [birth year] Today's Date: 04/08/2015   History of Present Illness  50 YO female with 2 weeks of LE weakness. MRI's of neurological axis shows no etiology. In addition TSH, B12, CK, ANA are normal. PMH DM, HTN, Asthma, obesity  Clinical Impression  Pt admitted with above diagnosis. Pt currently with functional limitations due to the deficits listed below (see PT Problem List). Pt with minimal tolerance for ambulation even with RW, left knee buckling in standing.Ambulated 7' at a time before needing to sit. Required min A for safety. Pt tender to palpation central back between L4 and S1 as well as left hip. Pt has pain that wraps around hip down to posterolateral knee. Pt has h/o old back injury with no recent f/u per her report. Request ortho consult for further evaluation.  Pt will benefit from skilled PT to increase their independence and safety with mobility to allow discharge to the venue listed below.       Follow Up Recommendations Home health PT    Equipment Recommendations  Rolling walker with 5" wheels;Other (comment) (preferably bariatric rollator so she has seat with her)    Recommendations for Other Services       Precautions / Restrictions Precautions Precautions: Fall Precaution Comments: L LE buckles      Mobility  Bed Mobility Overal bed mobility: Modified Independent             General bed mobility comments: pt received in chair  Transfers Overall transfer level: Needs assistance Equipment used: Rolling walker (2 wheeled) Transfers: Sit to/from Stand Sit to Stand: Min guard Stand pivot transfers: Min guard       General transfer comment: min-guard from bed and chair, performed multiple times and pt without difficulty with transfer but once she is standing reports feeling "whoozy" and left knee buckling  Ambulation/Gait Ambulation/Gait assistance: Min  assist Ambulation Distance (Feet): 20 Feet (3', 7', 7', 3') Assistive device: Rolling walker (2 wheeled) Gait Pattern/deviations: Step-through pattern;Shuffle;Decreased weight shift to left;Trendelenburg Gait velocity: decreased Gait velocity interpretation: <1.8 ft/sec, indicative of risk for recurrent falls General Gait Details: even with RW, pt could not ambulate more than 7' before cued to sit by therapist because left knee buckling, pt also continued to c/o feeling whoozy. Of note though, pt's nurse reports that she has been ambulating to bathroom regularly. Left Trendelenberg noted  Stairs            Wheelchair Mobility    Modified Rankin (Stroke Patients Only)       Balance Overall balance assessment: Needs assistance Sitting-balance support: Feet supported Sitting balance-Leahy Scale: Good     Standing balance support: Single extremity supported Standing balance-Leahy Scale: Fair Standing balance comment: cannot accept challenged in standing without UE support                             Pertinent Vitals/Pain Pain Assessment: Faces Faces Pain Scale: Hurts little more Pain Location: cebtral back and left hip Pain Descriptors / Indicators: Aching Pain Intervention(s): Limited activity within patient's tolerance;Monitored during session         Pt feeling "Whoozy" with ambulation        BP 125/90        HR 70 bpm        O2 sats 98% on RA  Home Living Family/patient expects to be discharged to:: Private residence Living Arrangements:  Alone   Type of Home: Apartment Home Access: Elevator     Home Layout: One level Home Equipment: Grab bars - tub/shower Additional Comments: pt ambulates holding an umbrella    Prior Function Level of Independence: Independent with assistive device(s)         Comments: pt reports that she has become increasingly weak with ability to be mobile only a few hours out of each day and increasing need for rest      Hand Dominance   Dominant Hand: Right    Extremity/Trunk Assessment   Upper Extremity Assessment: Defer to OT evaluation       LUE Deficits / Details: pt reports having a diagnosed RCR   Lower Extremity Assessment: RLE deficits/detail;LLE deficits/detail RLE Deficits / Details: WFL LLE Deficits / Details: hip flex 3+/5, knee ext 3+/5, knee flex 3+/5, ankle df 3/5, noted pt drags LLE with ambulation and left knee buckles during ambulation which she feels is originating at hip  Cervical / Trunk Assessment: Normal  Communication   Communication: No difficulties  Cognition Arousal/Alertness: Awake/alert Behavior During Therapy: WFL for tasks assessed/performed Overall Cognitive Status: Within Functional Limits for tasks assessed                      General Comments General comments (skin integrity, edema, etc.): pt has h/o of back injury >20 yrs ago, no f/u for back dysfunction recently. Tender to palpation between L4-S1 central back as well as left hip. Pain extends down to posterolateral knee    Exercises        Assessment/Plan    PT Assessment Patient needs continued PT services  PT Diagnosis Difficulty walking;Abnormality of gait;Acute pain;Generalized weakness   PT Problem List Decreased strength;Decreased activity tolerance;Decreased balance;Decreased mobility;Decreased coordination;Decreased knowledge of use of DME;Decreased knowledge of precautions;Pain  PT Treatment Interventions DME instruction;Gait training;Functional mobility training;Therapeutic activities;Therapeutic exercise;Balance training;Patient/family education   PT Goals (Current goals can be found in the Care Plan section) Acute Rehab PT Goals Patient Stated Goal: find out why her L LE is so weak PT Goal Formulation: With patient Time For Goal Achievement: 04/22/15 Potential to Achieve Goals: Fair    Frequency Min 3X/week   Barriers to discharge Decreased caregiver support       Co-evaluation               End of Session Equipment Utilized During Treatment: Gait belt Activity Tolerance: Patient limited by pain;Patient limited by fatigue Patient left: in chair;with call bell/phone within reach Nurse Communication: Mobility status         Time: [id number] PT Time Calculation (min) (ACUTE ONLY): 30 min   Charges:   PT Evaluation $PT Eval Moderate Complexity: 1 Procedure PT Treatments $Gait Training: 8-22 mins   PT G Codes:      Leighton Roach, PT  Acute Rehab Services  [place], [place] 04/08/2015, 4:03 PM

## 2015-04-08 NOTE — Evaluation (Signed)
Occupational Therapy Evaluation Patient Details Name: Tonya Quinn MRN: [id number] DOB: Mar 03, 1966 Today's Date: 04/08/2015    History of Present Illness 50 YO female with 2 weeks of LE weakness. MRI's of neurological axis shows no etiology. In addition TSH, B12, CK, ANA are normal. PMH DM, HTN, Asthma, obesity   Clinical Impression   Pt was struggling to care for herself prior to admission due to LE weakness and back pain with prolonged standing and walking and with attempt to carry items. Pt with limited ability to ambulate with RW with LE buckling.  She must perform self care from a seated position due to fall risk.  Recommending orthopedic consult for further assessment. Will follow acutely.    Follow Up Recommendations  Home health OT    Equipment Recommendations  3 in 1 bedside comode (bariatric), rollator (bariatric)   Recommendations for Other Services       Precautions / Restrictions Precautions Precautions: Fall Precaution Comments: L LE buckles      Mobility Bed Mobility Overal bed mobility: Modified Independent                Transfers Overall transfer level: Needs assistance Equipment used: Rolling walker (2 wheeled) Transfers: Sit to/from [facility] Sit to Stand: Min guard Stand pivot transfers: Min guard            Balance                                            ADL Overall ADL's : Needs assistance/impaired Eating/Feeding: Independent;Sitting   Grooming: Wash/dry hands;Sitting;Set up Grooming Details (indicate cue type and reason): sat to perform, pt reporting L hip/knee gives out Upper Body Bathing: Set up;Sitting   Lower Body Bathing: Min guard;Sit to/from stand   Upper Body Dressing : Set up;Sitting   Lower Body Dressing: Min guard;Sit to/from stand Lower Body Dressing Details (indicate cue type and reason): able to cross foot over opposite knee to don and doff socks Toilet Transfer: Min  guard;RW;Ambulation   Toileting- Clothing Manipulation and Hygiene: Min guard;Sit to/from stand               Vision     Perception     Praxis      Pertinent Vitals/Pain Pain Assessment: Faces Faces Pain Scale: Hurts little more Pain Location: L shoulder at end range FF Pain Descriptors / Indicators: Grimacing Pain Intervention(s): Monitored during session;Repositioned     Hand Dominance Right   Extremity/Trunk Assessment Upper Extremity Assessment Upper Extremity Assessment: LUE deficits/detail LUE Deficits / Details: pt reports having a diagnosed RCR   Lower Extremity Assessment Lower Extremity Assessment: Defer to PT evaluation       Communication Communication Communication: No difficulties   Cognition Arousal/Alertness: Awake/alert Behavior During Therapy: WFL for tasks assessed/performed Overall Cognitive Status: Within Functional Limits for tasks assessed                     General Comments       Exercises       Shoulder Instructions      Home Living Family/patient expects to be discharged to:: Private residence Living Arrangements: Alone   Type of Home: Apartment Home Access: Elevator     Home Layout: One level     Bathroom Shower/Tub: Teacher, early years/pre: Standard     Home Equipment:  Grab bars - tub/shower   Additional Comments: pt ambulates holding an umbrella      Prior Functioning/Environment Level of Independence: Independent with assistive device(s)        Comments: Pt struggling to care for herself. Fearful of stepping over edge of tub, Quinn sponge bathing. Difficulty rising from toilet, carry in groceries and laundry basket.    OT Diagnosis: Generalized weakness   OT Problem List: Decreased strength;Decreased activity tolerance;Impaired balance (sitting and/or standing);Decreased knowledge of use of DME or AE;Obesity;Pain   OT Treatment/Interventions: Self-care/ADL training;DME and/or AE  instruction;Balance training;Patient/family education;Therapeutic activities    OT Goals(Current goals can be found in the care plan section) Acute Rehab OT Goals Patient Stated Goal: find out why her L LE is Quinn weak OT Goal Formulation: With patient Time For Goal Achievement: 04/15/15 Potential to Achieve Goals: Good ADL Goals Pt Will Perform Lower Body Bathing: with modified independence;sit to/from stand (and be aware of AE) Pt Will Perform Lower Body Dressing: with modified independence;sit to/from stand (and be aware of AE) Pt Will Transfer to Toilet: with modified independence;ambulating;bedside commode (over toilet) Pt Will Perform Toileting - Clothing Manipulation and hygiene: with modified independence;sit to/from stand Additional ADL Goal #1: Pt will gather items for ADL safely using RW.  OT Frequency: Min 2X/week   Barriers to D/C:            Co-evaluation              End of Session Equipment Utilized During Treatment: Rolling walker  Activity Tolerance: Patient tolerated treatment well Patient left: in bed;with call bell/phone within reach;with bed alarm set   Time: 1430-1453 OT Time Calculation (min): 23 min Charges:  OT General Charges $OT Visit: 1 Procedure OT Evaluation $OT Eval Low Complexity: 1 Procedure OT Treatments $Self Care/Home Management : 8-22 mins G-Codes:    Tonya Quinn 04/08/2015, 3:38 PM  [phone]

## 2015-04-08 NOTE — Progress Notes (Signed)
Spoke with Jackelyn Poling the case manager in regards to pt's order for home health PT/OT and BSC. Pt unable to get items d/t lack of insurance. Pt made aware. Awaiting arrival of supplies and pt's ability to get a ride home to discharge pt.

## 2015-04-08 NOTE — Care Management Note (Addendum)
Case Management Note  Patient Details  Name: Tonya Quinn MRN: [id number] Date of Birth: [birth year]  Subjective/Objective:                  Date- 04-08-15 Initial Assessment Spoke with patient at the bedside.  Introduced self as Tourist information centre manager and explained role in discharge planning and how to be reached.  Verified patient lives in  [place].  Verified patient anticipates to go home alone at time of discharge Patient has DME nebulizer. Expressed potential need for cane (using umbrella) or rolator. CM able to secure rolling walker through [facility] via charity. It will be delivered to room prior to discharge  Patient denied needing help with their medication. Stated that she gets all her medications at [facility]" in [place]. Patient stated that she drives although it hurts sometimes to MD appointments.  Verified patient has PCP Dr Red Christians at the [facility] in [place]. Patient states they currently receive [place] services through no one.  Patient states that she had a medicaid hearing last week and is waiting to find out if she was approved. Admission Comments: Patient admitted with weakness, plan for DC today.   Carles Collet RN BSN CM [phone]   Action/Plan:  Dc to home, rolling walker to be provided through charity by [facility].  Unable to obtain bariatric 3 in 1 through charity. Patient does not qualify for [facility] PT through charity.   Expected Discharge Date:                  Expected Discharge Plan:  Home/Self Care  In-House Referral:     Discharge planning Services  CM Consult  Post Acute Care Choice:  Durable Medical Equipment Choice offered to:     DME Arranged:  Walker rolling DME Agency:  [place]:    [place] Agency:     Status of Service:  Completed, signed off  Medicare Important Message Given:    Date Medicare IM Given:    Medicare IM give by:    Date Additional Medicare IM Given:    Additional Medicare Important Message give  by:     If discussed at [place] of Stay Meetings, dates discussed:    Additional Comments:  Carles Collet, RN 04/08/2015, 12:16 PM

## 2015-04-08 NOTE — Progress Notes (Signed)
Waiting for physical therapy to see and evaluate pt before pt can be discharged.

## 2015-04-08 NOTE — Progress Notes (Signed)
Inpatient Diabetes Program Recommendations  AACE/ADA: New Consensus Statement on Inpatient Glycemic Control (2015)  Target Ranges:  Prepandial:   less than 140 mg/dL      Peak postprandial:   less than 180 mg/dL (1-2 hours)      Critically ill patients:  140 - 180 mg/dL   Results for Tonya Quinn, Tonya Quinn (MRN [id number]) as of 04/08/2015 10:33  Ref. Range 04/07/2015 09:43 04/07/2015 13:34 04/07/2015 22:08 04/07/2015 22:14 04/08/2015 08:02  Glucose-Capillary Latest Ref Range: 65-99 mg/dL 321 (H) 332 (H) 424 (H) 396 (H) 318 (H)   Review of Glycemic Control  Diabetes history: DM 2 Outpatient Diabetes medications: Levemir 20 units, Glipizide 10 mg Daily Current orders for Inpatient glycemic control: Levemir 25 units, Glipizide 10 mg Daily, Novolog Moderate + HS scale  Inpatient Diabetes Program Recommendations: Insulin - Basal: Glucose in the 300-400 range. Please increase basal insulin to 35 units QHS. HgbA1C: 15.2% on 1/24. Very uncontrolled at home.  Note: Patient may need to be discharged on higher basal insulin dose for home.  Thanks,  Tama Headings RN, MSN, [facility] Inpatient Diabetes Coordinator Team Pager [phone] (8a-5p)

## 2015-04-08 NOTE — Progress Notes (Signed)
Subjective: Feels her LE weakness has improved.   Exam: Filed Vitals:   04/08/15 0613 04/08/15 0900  BP: 110/71 134/79  Pulse:  81  Temp: 97.8 F (36.6 C)   Resp: 18         Gen: In bed, NAD MS:  Alert and oriented. Follows all commands.  CN: 2-12 grossly intact.  Motor: 5/5 throughout with give way weakness in LE Sensory: intact   Pertinent Labs: MRI brain, cervical, thoracic and lumbar shows no pathology for patietents weakness.   Etta Quill PA-C [facility] [phone]  Impression: 50 YO female with 2 weeks of LE weakness. MRI's of neurological axis shows no etiology. In addition TSH, B12, CK, ANA are normal. Recommend PT and if weakness persists, out patient neurologic follow up for EMG/NCV.    NEUROLOGY S/O    04/08/2015, 10:00 AM

## 2015-04-08 NOTE — Progress Notes (Signed)
Inpatient Diabetes Program Recommendations  AACE/ADA: New Consensus Statement on Inpatient Glycemic Control (2015)  Target Ranges:  Prepandial:   less than 140 mg/dL      Peak postprandial:   less than 180 mg/dL (1-2 hours)      Critically ill patients:  140 - 180 mg/dL   Spoke with patient about diabetes and home regimen for diabetes control. Patient reports that she is followed by her PCP, Dr. Red Christians, for diabetes management and currently she takes Glipizide 10 Daily, Levemir 15 units which was increased to 20 units at her last PCP visit, Humalog prescription pt has not picked up yet.  Inquired about knowledge about A1C and patient reports that this is all her and her PCP talk about. Discussed A1C results (15.2% on 04/07/2015), basic pathophysiology of DM Type 2, basic home care, importance of checking CBGs and maintaining good CBG control to prevent long-term and short-term complications. Discussed impact of nutrition, exercise, stress, sickness, and medications on diabetes control.  Patient states that she has seen a nutritionist in the past. Gave patient a Diabetes Meal Planning Guide, and ADA website for recipes and resources. Patient reports needing to change what she ate and drank, she mostly drinks juice to quench her thirst. Discussed carbohydrates, carbohydrate goals per day and meal, along with portion sizes. Patient states that she is not followed by an endocrinologist, discussed with patient the need to decrease her A1c and keep it down due to possible complications. Spoke with patient to have conversation with PCP to be seen by and Endocrinologist as she says they have been working to get her A1c down for a long time. I spoke with patient about meeting the medication halfway and exercise and control her diet as discussed.  Patient verbalized understanding of information discussed and she states that she has no further questions at this time related to diabetes.   Thanks, Tama Headings RN,  MSN, [facility] Inpatient Diabetes Coordinator Team Pager [phone] (8a-5p)

## 2015-04-14 NOTE — Progress Notes (Signed)
Late entry due to missed g-code.   2015-05-03 0800  OT G-codes **NOT FOR INPATIENT CLASS**  Functional Assessment Tool Used clinical judgement  Functional Limitation Self care  Self Care Current Status [phone]) CI  Self Care Goal Status [id number]) CI  Self Care Discharge Status [phone]) CI  05-03-2015 Nestor Lewandowsky, OTR/L Pager: [phone]

## 2015-04-19 NOTE — Progress Notes (Signed)
   04/19/15 1605  PT G-Codes **NOT FOR INPATIENT CLASS**  Functional Assessment Tool Used clinical judgement  Functional Limitation Mobility: Walking and moving around  Mobility: Walking and Moving Around Current Status [id number]) CJ  Mobility: Walking and Moving Around Goal Status [phone]) CI  Leighton Roach, [place]

## 2015-07-13 DIAGNOSIS — N951 Menopausal and female climacteric states: Secondary | ICD-10-CM | POA: Insufficient documentation

## 2015-07-13 DIAGNOSIS — M51369 Other intervertebral disc degeneration, lumbar region without mention of lumbar back pain or lower extremity pain: Secondary | ICD-10-CM

## 2015-07-13 DIAGNOSIS — I152 Hypertension secondary to endocrine disorders: Secondary | ICD-10-CM | POA: Insufficient documentation

## 2015-07-13 DIAGNOSIS — E1159 Type 2 diabetes mellitus with other circulatory complications: Secondary | ICD-10-CM

## 2015-07-13 DIAGNOSIS — M5136 Other intervertebral disc degeneration, lumbar region: Secondary | ICD-10-CM | POA: Insufficient documentation

## 2015-07-13 HISTORY — DX: Other intervertebral disc degeneration, lumbar region without mention of lumbar back pain or lower extremity pain: M51.369

## 2015-07-13 HISTORY — DX: Menopausal and female climacteric states: N95.1

## 2015-07-13 HISTORY — DX: Other intervertebral disc degeneration, lumbar region: M51.36

## 2015-07-13 HISTORY — DX: Hypertension secondary to endocrine disorders: I15.2

## 2015-07-13 HISTORY — DX: Type 2 diabetes mellitus with other circulatory complications: E11.59

## 2015-07-14 ENCOUNTER — Encounter (HOSPITAL_BASED_OUTPATIENT_CLINIC_OR_DEPARTMENT_OTHER): Payer: Self-pay

## 2015-07-14 ENCOUNTER — Emergency Department (HOSPITAL_BASED_OUTPATIENT_CLINIC_OR_DEPARTMENT_OTHER)
Admission: EM | Admit: 2015-07-14 | Discharge: 2015-07-15 | Disposition: A | Discharge status: Home / Self Care | Payer: Medicaid Other | Attending: Emergency Medicine | Admitting: Emergency Medicine

## 2015-07-14 DIAGNOSIS — M545 Low back pain: Secondary | ICD-10-CM | POA: Diagnosis present

## 2015-07-14 DIAGNOSIS — Z79899 Other long term (current) drug therapy: Secondary | ICD-10-CM | POA: Insufficient documentation

## 2015-07-14 DIAGNOSIS — I10 Essential (primary) hypertension: Secondary | ICD-10-CM | POA: Diagnosis not present

## 2015-07-14 DIAGNOSIS — J45909 Unspecified asthma, uncomplicated: Secondary | ICD-10-CM | POA: Insufficient documentation

## 2015-07-14 DIAGNOSIS — M542 Cervicalgia: Secondary | ICD-10-CM | POA: Diagnosis not present

## 2015-07-14 DIAGNOSIS — I4891 Unspecified atrial fibrillation: Secondary | ICD-10-CM | POA: Diagnosis not present

## 2015-07-14 DIAGNOSIS — E039 Hypothyroidism, unspecified: Secondary | ICD-10-CM | POA: Diagnosis not present

## 2015-07-14 DIAGNOSIS — M549 Dorsalgia, unspecified: Secondary | ICD-10-CM

## 2015-07-14 DIAGNOSIS — E119 Type 2 diabetes mellitus without complications: Secondary | ICD-10-CM | POA: Insufficient documentation

## 2015-07-14 DIAGNOSIS — Z794 Long term (current) use of insulin: Secondary | ICD-10-CM | POA: Insufficient documentation

## 2015-07-14 DIAGNOSIS — E669 Obesity, unspecified: Secondary | ICD-10-CM | POA: Insufficient documentation

## 2015-07-14 DIAGNOSIS — G8929 Other chronic pain: Secondary | ICD-10-CM

## 2015-07-14 NOTE — ED Provider Notes (Signed)
CSN: [id number]     Arrival date & time 07/14/15  2107 History  By signing my name below, I, Tonya Quinn, attest that this documentation has been prepared under the direction and in the presence of Zabian Swayne, MD . Electronically Signed: Evelene Quinn, Scribe. 07/15/2015. 12:00 AM.  Chief Complaint  Patient presents with  . Back Pain     Patient is a 50 y.o. female presenting with back pain. The history is provided by the patient. No language interpreter was used.  Back Pain Location:  Lumbar spine Quality:  Aching Radiates to:  Does not radiate Pain severity:  Moderate Pain is:  Same all the time Onset quality:  Gradual Timing:  Constant Progression:  Unchanged Chronicity:  Chronic Context: not emotional stress, not physical stress, not recent illness, not recent injury and not twisting   Relieved by:  Nothing Worsened by:  Nothing tried Ineffective treatments:  Muscle relaxants Associated symptoms: no abdominal pain, no abdominal swelling, no bladder incontinence, no bowel incontinence, no chest pain, no dysuria, no fever, no headaches, no leg pain, no numbness, no paresthesias, no perianal numbness, no tingling and no weakness   Risk factors: no hx of cancer   Risk factors comment:  Chronic pain in PT at [facility] currently   HPI Comments:  Tonya Quinn is a 50 y.o. female with a history of DDD and chronic back pain, who presents to the Emergency Department complaining of acute onchronic lower back pain with associated left sided neck pain since this AM. She reports taking zanaflex ~1600 without relief. Pt notes she is currently out of her pain meds. She is not followed by pain management at this time.   Pt is followed by PT at [facility]   Past Medical History  Diagnosis Date  . Diabetes mellitus   . Hypertension   . Asthma   . DDD (degenerative disc disease), lumbar   . Obesity   . HLD (hyperlipidemia)   . Hypothyroidism   . Atrial fibrillation [facility]    Past Surgical  History  Procedure Laterality Date  . Vein surgery    . Uterine ablation    . Laparoscopic assisted vaginal hysterectomy  02/29/2012    Procedure: LAPAROSCOPIC ASSISTED VAGINAL HYSTERECTOMY;  Surgeon: Cheri Fowler, MD;  Location: [facility];  Service: Gynecology;  Laterality: N/A;  . Salpingoophorectomy  02/29/2012    Procedure: SALPINGO OOPHORECTOMY;  Surgeon: Cheri Fowler, MD;  Location: [facility];  Service: Gynecology;  Laterality: Bilateral;  . Cystoscopy  02/29/2012    Procedure: CYSTOSCOPY;  Surgeon: Cheri Fowler, MD;  Location: [facility];  Service: Gynecology;  Laterality: N/A;  . Abdominal hysterectomy     Family History  Problem Relation Age of Onset  . Hypertension Brother   . Diabetes Brother    Social History  Substance Use Topics  . Smoking status: Never Smoker   . Smokeless tobacco: Never Used  . Alcohol Use: No   OB History    No data available     Review of Systems  Constitutional: Negative for fever.  Cardiovascular: Negative for chest pain.  Gastrointestinal: Negative for abdominal pain and bowel incontinence.  Genitourinary: Negative for bladder incontinence, dysuria and difficulty urinating.  Musculoskeletal: Positive for back pain and neck pain.  Neurological: Negative for tingling, weakness, numbness, headaches and paresthesias.  All other systems reviewed and are negative.  Allergies  Lisinopril  Home Medications   Prior to Admission medications   Medication Sig Start Date End Date Taking? Authorizing Provider  tiZANidine (ZANAFLEX) 4 MG capsule Take 4 mg by mouth 3 (three) times daily.   Yes Historical Provider, MD  albuterol (PROVENTIL HFA;VENTOLIN HFA) 108 (90 Base) MCG/ACT inhaler Inhale into the lungs.    Historical Provider, MD  ALPRAZolam Duanne Moron) 0.25 MG tablet Take 0.25 mg by mouth at bedtime as needed. For anxiety    Historical Provider, MD  amLODipine (NORVASC) 10 MG tablet Take 10 mg by mouth daily.    Historical Provider, MD  apixaban  (ELIQUIS) 5 MG TABS tablet Take 5 mg by mouth 2 (two) times daily. 01/04/15   Historical Provider, MD  atenolol (TENORMIN) 50 MG tablet Take 25 mg by mouth daily.    Historical Provider, MD  atorvastatin (LIPITOR) 40 MG tablet Take 40 mg by mouth every evening. 01/04/15   Historical Provider, MD  buPROPion (WELLBUTRIN XL) 150 MG 24 hr tablet Take 150 mg by mouth daily.    Historical Provider, MD  celecoxib (CELEBREX) 200 MG capsule Take 200 mg by mouth daily.    Historical Provider, MD  Cholecalciferol (VITAMIN D PO) Take 1 tablet by mouth daily.    Historical Provider, MD  cyclobenzaprine (FLEXERIL) 10 MG tablet Take 10 mg by mouth at bedtime as needed. For back spasm    Historical Provider, MD  glipiZIDE (GLUCOTROL) 10 MG tablet Take 10 mg by mouth daily before breakfast.    Historical Provider, MD  insulin detemir (LEVEMIR) 100 UNIT/ML injection Inject 0.35 mLs (35 Units total) into the skin at bedtime. 04/08/15   Debbe Odea, MD  levothyroxine (SYNTHROID, LEVOTHROID) 25 MCG tablet Take 25 mcg by mouth daily.    Historical Provider, MD  losartan (COZAAR) 100 MG tablet Take 100 mg by mouth daily.    Historical Provider, MD   BP 138/76 mmHg  Pulse 87  Temp(Src) 97.8 F (36.6 C) (Oral)  Resp 18  Ht 5\' 6"  (1.676 m)  Wt 303 lb (137.44 kg)  BMI 48.93 kg/m2  SpO2 98%  LMP 02/16/2012 Physical Exam  Constitutional: She is oriented to person, place, and time. She appears well-developed and well-nourished. No distress.  HENT:  Head: Normocephalic and atraumatic.  Mouth/Throat: Oropharynx is clear and moist. No oropharyngeal exudate.  Moist mucous membranes   Eyes: Conjunctivae are normal. Pupils are equal, round, and reactive to light.  Neck: Normal range of motion. Neck supple. No JVD present.  Trachea midline No bruit  Cardiovascular: Normal rate, regular rhythm and normal heart sounds.   Pulses:      Dorsalis pedis pulses are 2+ on the right side, and 2+ on the left side.   Pulmonary/Chest: Effort normal and breath sounds normal. No stridor. No respiratory distress. She has no wheezes. She has no rales.  Abdominal: Soft. Bowel sounds are normal. She exhibits no distension. There is no tenderness. There is no rebound and no guarding.  Musculoskeletal: Normal range of motion. She exhibits no edema or tenderness.  Paraspinal spasms No step off or crepitus   Neurological: She is alert and oriented to person, place, and time. She has normal reflexes.  Gait intact  Skin: Skin is warm and dry.  Psychiatric: She has a normal mood and affect. Her behavior is normal.  Nursing note and vitals reviewed.   ED Course  Procedures   DIAGNOSTIC STUDIES:  Oxygen Saturation is 98% on RA, normal by my interpretation.    COORDINATION OF CARE:  11:58 PM Discussed treatment plan with pt at bedside and pt agreed to plan.   Labs  Review Labs Reviewed - No data to display  Imaging Review No results found. I have personally reviewed and evaluated these images and lab results as part of my medical decision-making.   EKG Interpretation None      MDM   Final diagnoses:  None   Filed Vitals:   07/14/15 2119  BP: 138/76  Pulse: 87  Temp: 97.8 F (36.6 C)  Resp: 18   Results for orders placed or performed during the hospital encounter of 04/06/15  Ethanol  Result Value Ref Range   Alcohol, Ethyl (B) <5 <5 mg/dL  Protime-INR  Result Value Ref Range   Prothrombin Time 13.9 11.6 - 15.2 seconds   INR 1.05 0.00 - 1.49  APTT  Result Value Ref Range   aPTT 33 24 - 37 seconds  CBC  Result Value Ref Range   WBC 5.6 4.0 - 10.5 K/uL   RBC 4.72 3.87 - 5.11 MIL/uL   Hemoglobin 12.8 12.0 - 15.0 g/dL   HCT 39.8 36.0 - 46.0 %   MCV 84.3 78.0 - 100.0 fL   MCH 27.1 26.0 - 34.0 pg   MCHC 32.2 30.0 - 36.0 g/dL   RDW 14.1 11.5 - 15.5 %   Platelets 188 150 - 400 K/uL  Differential  Result Value Ref Range   Neutrophils Relative % 68 %   Neutro Abs 3.8 1.7 - 7.7 K/uL    Lymphocytes Relative 25 %   Lymphs Abs 1.4 0.7 - 4.0 K/uL   Monocytes Relative 5 %   Monocytes Absolute 0.3 0.1 - 1.0 K/uL   Eosinophils Relative 2 %   Eosinophils Absolute 0.1 0.0 - 0.7 K/uL   Basophils Relative 0 %   Basophils Absolute 0.0 0.0 - 0.1 K/uL  Comprehensive metabolic panel  Result Value Ref Range   Sodium 132 (L) 135 - 145 mmol/L   Potassium 3.7 3.5 - 5.1 mmol/L   Chloride 98 (L) 101 - 111 mmol/L   CO2 26 22 - 32 mmol/L   Glucose, Bld 474 (H) 65 - 99 mg/dL   BUN 10 6 - 20 mg/dL   Creatinine, Ser 0.70 0.44 - 1.00 mg/dL   Calcium 9.2 8.9 - 10.3 mg/dL   Total Protein 8.0 6.5 - 8.1 g/dL   Albumin 3.9 3.5 - 5.0 g/dL   AST 22 15 - 41 U/L   ALT 25 14 - 54 U/L   Alkaline Phosphatase 162 (H) 38 - 126 U/L   Total Bilirubin 0.2 (L) 0.3 - 1.2 mg/dL   GFR calc non Af Amer >60 >60 mL/min   GFR calc Af Amer >60 >60 mL/min   Anion gap 8 5 - 15  Urine rapid drug screen (hosp performed)not at [facility]  Result Value Ref Range   Opiates NONE DETECTED NONE DETECTED   Cocaine NONE DETECTED NONE DETECTED   Benzodiazepines NONE DETECTED NONE DETECTED   Amphetamines NONE DETECTED NONE DETECTED   Tetrahydrocannabinol NONE DETECTED NONE DETECTED   Barbiturates NONE DETECTED NONE DETECTED  Urinalysis, Routine w reflex microscopic (not at [facility])  Result Value Ref Range   Color, Urine YELLOW YELLOW   APPearance CLEAR CLEAR   Specific Gravity, Urine 1.027 1.005 - 1.030   pH 6.0 5.0 - 8.0   Glucose, UA >1000 (A) NEGATIVE mg/dL   Hgb urine dipstick NEGATIVE NEGATIVE   Bilirubin Urine NEGATIVE NEGATIVE   Ketones, ur NEGATIVE NEGATIVE mg/dL   Protein, ur NEGATIVE NEGATIVE mg/dL   Nitrite NEGATIVE NEGATIVE   Leukocytes, UA  NEGATIVE NEGATIVE  Troponin I  Result Value Ref Range   Troponin I <0.03 <0.031 ng/mL  Urine microscopic-add on  Result Value Ref Range   Squamous Epithelial / LPF 0-5 (A) NONE SEEN   WBC, UA 0-5 0 - 5 WBC/hpf   RBC / HPF NONE SEEN 0 - 5 RBC/hpf   Bacteria, UA RARE  (A) NONE SEEN  TSH  Result Value Ref Range   TSH 1.579 0.350 - 4.500 uIU/mL  Vitamin B12  Result Value Ref Range   Vitamin B-12 656 180 - 914 pg/mL  CK  Result Value Ref Range   Total CK 40 38 - 234 U/L  Antinuclear Antibodies, IFA  Result Value Ref Range   ANA Ab, IFA Positive (A)   Hemoglobin A1c  Result Value Ref Range   Hgb A1c MFr Bld 15.2 (H) 4.8 - 5.6 %   Mean Plasma Glucose 390 mg/dL  Lipid panel  Result Value Ref Range   Cholesterol 216 (H) 0 - 200 mg/dL   Triglycerides 139 <150 mg/dL   HDL 56 >40 mg/dL   Total CHOL/HDL Ratio 3.9 RATIO   VLDL 28 0 - 40 mg/dL   LDL Cholesterol 132 (H) 0 - 99 mg/dL  Comprehensive metabolic panel  Result Value Ref Range   Sodium 137 135 - 145 mmol/L   Potassium 3.7 3.5 - 5.1 mmol/L   Chloride 101 101 - 111 mmol/L   CO2 23 22 - 32 mmol/L   Glucose, Bld 453 (H) 65 - 99 mg/dL   BUN 7 6 - 20 mg/dL   Creatinine, Ser 0.65 0.44 - 1.00 mg/dL   Calcium 9.2 8.9 - 10.3 mg/dL   Total Protein 7.1 6.5 - 8.1 g/dL   Albumin 3.4 (L) 3.5 - 5.0 g/dL   AST 20 15 - 41 U/L   ALT 25 14 - 54 U/L   Alkaline Phosphatase 140 (H) 38 - 126 U/L   Total Bilirubin 0.4 0.3 - 1.2 mg/dL   GFR calc non Af Amer >60 >60 mL/min   GFR calc Af Amer >60 >60 mL/min   Anion gap 13 5 - 15  CBC  Result Value Ref Range   WBC 5.3 4.0 - 10.5 K/uL   RBC 4.50 3.87 - 5.11 MIL/uL   Hemoglobin 12.2 12.0 - 15.0 g/dL   HCT 37.7 36.0 - 46.0 %   MCV 83.8 78.0 - 100.0 fL   MCH 27.1 26.0 - 34.0 pg   MCHC 32.4 30.0 - 36.0 g/dL   RDW 14.0 11.5 - 15.5 %   Platelets 170 150 - 400 K/uL  Glucose, capillary  Result Value Ref Range   Glucose-Capillary 424 (H) 65 - 99 mg/dL  Glucose, capillary  Result Value Ref Range   Glucose-Capillary 396 (H) 65 - 99 mg/dL  Glucose, capillary  Result Value Ref Range   Glucose-Capillary 318 (H) 65 - 99 mg/dL  Glucose, capillary  Result Value Ref Range   Glucose-Capillary 317 (H) 65 - 99 mg/dL  Glucose, capillary  Result Value Ref Range    Glucose-Capillary 277 (H) 65 - 99 mg/dL   Comment 1 Notify RN    Comment 2 Document in Chart   Glucose, capillary  Result Value Ref Range   Glucose-Capillary 344 (H) 65 - 99 mg/dL   Comment 1 Notify RN    Comment 2 Document in Chart   FANA Staining Patterns  Result Value Ref Range   Homogeneous Pattern 1:320 (H)    Speckled Pattern 1:160 (  H)    Note: Comment   CBG monitoring, ED  Result Value Ref Range   Glucose-Capillary 318 (H) 65 - 99 mg/dL  POC CBG, ED  Result Value Ref Range   Glucose-Capillary 321 (H) 65 - 99 mg/dL  CBG monitoring, ED  Result Value Ref Range   Glucose-Capillary 332 (H) 65 - 99 mg/dL   No results found.  Follow up with your pain management team for ongoing care.  Voltaren gel.  Strict return precautions given  I personally performed the services described in this documentation, which was scribed in my presence. The recorded information has been reviewed and is accurate.     Veatrice Kells, MD 07/15/15 [phone]

## 2015-07-14 NOTE — ED Notes (Signed)
C/o pain to entire back, left hip and left side of neck-no recent injury-walking with own cane

## 2015-07-15 MED ORDER — DICLOFENAC SODIUM 1 % TD GEL: 4.0000 g | Freq: Four times a day (QID) | TRANSDERMAL | Status: DC

## 2015-07-15 MED ORDER — KETOROLAC TROMETHAMINE 60 MG/2ML IM SOLN
60.0000 mg | Freq: Once | INTRAMUSCULAR | Status: AC
  Administered 2015-07-15: 60 mg via INTRAMUSCULAR
  Filled 2015-07-15: qty 2

## 2015-07-15 MED ORDER — PREDNISONE 20 MG PO TABS: ORAL_TABLET | ORAL | Status: DC

## 2015-07-15 MED ORDER — PREDNISONE 50 MG PO TABS
60.0000 mg | ORAL_TABLET | Freq: Once | ORAL | Status: AC
  Administered 2015-07-15: 60 mg via ORAL
  Filled 2015-07-15: qty 1

## 2015-09-24 DIAGNOSIS — N951 Menopausal and female climacteric states: Secondary | ICD-10-CM

## 2015-09-24 HISTORY — DX: Menopausal and female climacteric states: N95.1

## 2015-12-29 ENCOUNTER — Encounter (HOSPITAL_BASED_OUTPATIENT_CLINIC_OR_DEPARTMENT_OTHER): Payer: Self-pay

## 2015-12-29 ENCOUNTER — Emergency Department (HOSPITAL_BASED_OUTPATIENT_CLINIC_OR_DEPARTMENT_OTHER)
Admission: EM | Admit: 2015-12-29 | Discharge: 2015-12-29 | Disposition: A | Discharge status: Home / Self Care | Payer: Medicaid Other | Attending: Emergency Medicine | Admitting: Emergency Medicine

## 2015-12-29 DIAGNOSIS — J45909 Unspecified asthma, uncomplicated: Secondary | ICD-10-CM | POA: Diagnosis not present

## 2015-12-29 DIAGNOSIS — Y939 Activity, unspecified: Secondary | ICD-10-CM | POA: Insufficient documentation

## 2015-12-29 DIAGNOSIS — M25532 Pain in left wrist: Secondary | ICD-10-CM

## 2015-12-29 DIAGNOSIS — S66912A Strain of unspecified muscle, fascia and tendon at wrist and hand level, left hand, initial encounter: Secondary | ICD-10-CM | POA: Diagnosis not present

## 2015-12-29 DIAGNOSIS — Z79899 Other long term (current) drug therapy: Secondary | ICD-10-CM | POA: Insufficient documentation

## 2015-12-29 DIAGNOSIS — E039 Hypothyroidism, unspecified: Secondary | ICD-10-CM | POA: Diagnosis not present

## 2015-12-29 DIAGNOSIS — Z794 Long term (current) use of insulin: Secondary | ICD-10-CM | POA: Diagnosis not present

## 2015-12-29 DIAGNOSIS — Y929 Unspecified place or not applicable: Secondary | ICD-10-CM | POA: Insufficient documentation

## 2015-12-29 DIAGNOSIS — W57XXXA Bitten or stung by nonvenomous insect and other nonvenomous arthropods, initial encounter: Secondary | ICD-10-CM | POA: Diagnosis not present

## 2015-12-29 DIAGNOSIS — Y999 Unspecified external cause status: Secondary | ICD-10-CM | POA: Diagnosis not present

## 2015-12-29 DIAGNOSIS — S6992XA Unspecified injury of left wrist, hand and finger(s), initial encounter: Secondary | ICD-10-CM | POA: Diagnosis present

## 2015-12-29 DIAGNOSIS — E119 Type 2 diabetes mellitus without complications: Secondary | ICD-10-CM | POA: Diagnosis not present

## 2015-12-29 HISTORY — DX: Sleep apnea, unspecified: G47.30

## 2015-12-29 NOTE — ED Triage Notes (Signed)
C/o pain to left hand since Saturday-denies injury-NAD-steady gait

## 2015-12-29 NOTE — Discharge Instructions (Signed)
[employer] [phone]  Numbers for Health Department: [address], [address]rd Floor, [place] Response Program (CHRP)/Care Management - [place] [phone]) [phone] Health Hazards Identification & Assessment - [id number]

## 2015-12-29 NOTE — ED Provider Notes (Signed)
[facility] Provider Note   CSN: [id number] Arrival date & time: 12/29/15  1125     History   Chief Complaint Chief Complaint  Patient presents with  . Hand Pain    HPI Tonya Quinn is a 50 y.o. female.  HPI   Left hand pain, noticed it when went outside on Saturday. Aching, sharp, dull, worse with movement, any pressure or movement. Can't lift with it.  Think pushed off with hand off of cot. (bed in storage due to bed bugs)  Bit 3 times last night.  Radiates to dorsum of hand up to wrist then elbow  Feels like it is exacerbating depression/anxiety.    No fevers, no numbness. No falls.  Always have some numbness in hand/feet from DM.   Past Medical History:  Diagnosis Date  . Asthma   . Atrial fibrillation ([place])   . DDD (degenerative disc disease), lumbar   . Diabetes mellitus   . HLD (hyperlipidemia)   . Hypertension   . Hypothyroidism   . Obesity   . Sleep apnea     Patient Active Problem List   Diagnosis Date Noted  . Weakness 04/08/2015  . Diabetes mellitus without complication ([place]) [id number]  . Anxiety 04/07/2015  . Left-sided weakness 04/07/2015  . Hypertension   . Asthma   . DDD (degenerative disc disease), lumbar   . Obesity   . HLD (hyperlipidemia)   . Hypothyroidism   . Essential hypertension   . Atrial fibrillation ([place])   . Fibroids 03/01/2012  . ALLERGIC RHINITIS 07/05/2006  . LOW BACK PAIN, CHRONIC 07/05/2006  . BACK PAIN 07/05/2006  . DYSURIA 07/05/2006    Past Surgical History:  Procedure Laterality Date  . ABDOMINAL HYSTERECTOMY    . CYSTOSCOPY  02/29/2012   Procedure: CYSTOSCOPY;  Surgeon: Cheri Fowler, MD;  Location: [facility];  Service: Gynecology;  Laterality: N/A;  . LAPAROSCOPIC ASSISTED VAGINAL HYSTERECTOMY  02/29/2012   Procedure: LAPAROSCOPIC ASSISTED VAGINAL HYSTERECTOMY;  Surgeon: Cheri Fowler, MD;  Location: [facility];  Service: Gynecology;  Laterality: N/A;  . SALPINGOOPHORECTOMY  02/29/2012   Procedure: SALPINGO OOPHORECTOMY;  Surgeon: Cheri Fowler, MD;  Location: [facility];  Service: Gynecology;  Laterality: Bilateral;  . uterine ablation    . VEIN SURGERY      OB History    No data available       Home Medications    Prior to Admission medications   Medication Sig Start Date End Date Taking? Authorizing Provider  FLUoxetine HCl (PROZAC PO) Take by mouth.   Yes Historical Provider, MD  Insulin Lispro (HUMALOG [place]) Inject into the skin.   Yes Historical Provider, MD  albuterol (PROVENTIL HFA;VENTOLIN HFA) 108 (90 Base) MCG/ACT inhaler Inhale into the lungs.    Historical Provider, MD  ALPRAZolam Duanne Moron) 0.25 MG tablet Take 0.25 mg by mouth at bedtime as needed. For anxiety    Historical Provider, MD  amLODipine (NORVASC) 10 MG tablet Take 10 mg by mouth daily.    Historical Provider, MD  atenolol (TENORMIN) 50 MG tablet Take 25 mg by mouth daily.    Historical Provider, MD  Cholecalciferol (VITAMIN D PO) Take 1 tablet by mouth daily.    Historical Provider, MD  diclofenac sodium (VOLTAREN) 1 % GEL Apply 4 g topically 4 (four) times daily. 07/15/15   April Palumbo, MD  losartan (COZAAR) 100 MG tablet Take 100 mg by mouth daily.    Historical Provider, MD  tiZANidine (ZANAFLEX) 4 MG capsule Take 4  mg by mouth 3 (three) times daily.    Historical Provider, MD    Family History Family History  Problem Relation Age of Onset  . Hypertension Brother   . Diabetes Brother     Social History Social History  Substance Use Topics  . Smoking status: Never Smoker  . Smokeless tobacco: Never Used  . Alcohol use No     Allergies   Lisinopril   Review of Systems Review of Systems  Constitutional: Negative for fever.  HENT: Negative for sore throat.   Eyes: Negative for visual disturbance.  Respiratory: Negative for cough and shortness of breath.   Cardiovascular: Negative for chest pain.  Gastrointestinal: Negative for abdominal pain.  Genitourinary: Negative for  difficulty urinating.  Musculoskeletal: Positive for arthralgias. Negative for back pain and neck pain.  Skin: Positive for rash (bed bugs bite).  Neurological: Negative for syncope and headaches.     Physical Exam Updated Vital Signs BP 148/88 (BP Location: Right Arm)   Pulse 89   Temp 98.1 F (36.7 C) (Oral)   Resp 18   Ht 5\' 6"  (1.676 m)   Wt (!) 312 lb (141.5 kg)   LMP 02/16/2012   SpO2 98%   BMI 50.36 kg/m   Physical Exam  Constitutional: She is oriented to person, place, and time. She appears well-developed and well-nourished. No distress.  HENT:  Head: Normocephalic and atraumatic.  Eyes: Conjunctivae and EOM are normal.  Neck: Normal range of motion.  Cardiovascular: Normal rate, regular rhythm, normal heart sounds and intact distal pulses.  Exam reveals no gallop and no friction rub.   No murmur heard. Pulmonary/Chest: Effort normal and breath sounds normal. No respiratory distress. She has no wheezes. She has no rales.  Abdominal: Soft. She exhibits no distension. There is no tenderness. There is no guarding.  Musculoskeletal: She exhibits no edema.       Left hand: She exhibits tenderness (dorsum of hand and wrist, tenderness along wrist extensor tendon). She exhibits normal range of motion. Normal sensation noted. Decreased sensation is not present in the ulnar distribution, is not present in the medial redistribution and is not present in the radial distribution. Normal strength noted. She exhibits no finger abduction, no thumb/finger opposition and no wrist extension trouble.  Neurological: She is alert and oriented to person, place, and time.  Skin: Skin is warm and dry. No rash noted. She is not diaphoretic. No erythema.  Nursing note and vitals reviewed.    ED Treatments / Results  Labs (all labs ordered are listed, but only abnormal results are displayed) Labs Reviewed - No data to display  EKG  EKG Interpretation None       Radiology No results  found.  Procedures Procedures (including critical care time)  Medications Ordered in ED Medications - No data to display   Initial Impression / Assessment and Plan / ED Course  I have reviewed the triage vital signs and the nursing notes.  Pertinent labs & imaging results that were available during my care of the patient were reviewed by me and considered in my medical decision making (see chart for details).  Clinical Course   50yo female presents with left wrist pain. No swelling, no redness, no sign of septic arthritis or gout. No hx of fall to suggest fracture.  Patient with repetitive motion of pushing self off of ground level cot as contributor. Possible epicondylitis given radiation to elbow, other wrist sprain or osteoarthritis.  Gave wrist splint for  comfort. Recommend ice/ibuprofen, PCP follow up.  Patient using cot on floor due to bedbugs in her room at [facility] (bed in storage) and is frustrated as she does not feel she has had support regarding this situation and bedbug eradicatoin.  Given her concerns regarding housing situation, recommended calling [employer] in [facility] and provided numbers for Health Department.  Patient discharged in stable condition with understanding of reasons to return.   Final Clinical Impressions(s) / ED Diagnoses   Final diagnoses:  Left wrist pain  Strain of left wrist, initial encounter    New Prescriptions Discharge Medication List as of 12/29/2015 12:20 PM       Gareth Morgan, MD 12/30/15 1755

## 2016-02-01 ENCOUNTER — Encounter (HOSPITAL_BASED_OUTPATIENT_CLINIC_OR_DEPARTMENT_OTHER): Payer: Self-pay | Admitting: *Deleted

## 2016-02-01 ENCOUNTER — Emergency Department (HOSPITAL_BASED_OUTPATIENT_CLINIC_OR_DEPARTMENT_OTHER)
Admission: EM | Admit: 2016-02-01 | Discharge: 2016-02-01 | Disposition: A | Discharge status: Home / Self Care | Payer: Medicaid Other | Attending: Emergency Medicine | Admitting: Emergency Medicine

## 2016-02-01 ENCOUNTER — Emergency Department (HOSPITAL_BASED_OUTPATIENT_CLINIC_OR_DEPARTMENT_OTHER): Payer: Medicaid Other

## 2016-02-01 DIAGNOSIS — E119 Type 2 diabetes mellitus without complications: Secondary | ICD-10-CM | POA: Diagnosis not present

## 2016-02-01 DIAGNOSIS — Z79899 Other long term (current) drug therapy: Secondary | ICD-10-CM | POA: Diagnosis not present

## 2016-02-01 DIAGNOSIS — I10 Essential (primary) hypertension: Secondary | ICD-10-CM | POA: Diagnosis not present

## 2016-02-01 DIAGNOSIS — R1011 Right upper quadrant pain: Secondary | ICD-10-CM | POA: Diagnosis present

## 2016-02-01 DIAGNOSIS — Z794 Long term (current) use of insulin: Secondary | ICD-10-CM | POA: Insufficient documentation

## 2016-02-01 DIAGNOSIS — E039 Hypothyroidism, unspecified: Secondary | ICD-10-CM | POA: Diagnosis not present

## 2016-02-01 DIAGNOSIS — J45909 Unspecified asthma, uncomplicated: Secondary | ICD-10-CM | POA: Insufficient documentation

## 2016-02-01 LAB — URINALYSIS, ROUTINE W REFLEX MICROSCOPIC
BILIRUBIN URINE: NEGATIVE
Glucose, UA: NEGATIVE mg/dL
HGB URINE DIPSTICK: NEGATIVE
Ketones, ur: NEGATIVE mg/dL
Leukocytes, UA: NEGATIVE
NITRITE: NEGATIVE
PH: 7 (ref 5.0–8.0)
Protein, ur: NEGATIVE mg/dL
SPECIFIC GRAVITY, URINE: 1.013 (ref 1.005–1.030)

## 2016-02-01 LAB — CBC
HCT: 36.6 % (ref 36.0–46.0)
Hemoglobin: 11.6 g/dL — ABNORMAL LOW (ref 12.0–15.0)
MCH: 26.8 pg (ref 26.0–34.0)
MCHC: 31.7 g/dL (ref 30.0–36.0)
MCV: 84.5 fL (ref 78.0–100.0)
PLATELETS: 183 10*3/uL (ref 150–400)
RBC: 4.33 MIL/uL (ref 3.87–5.11)
RDW: 14.7 % (ref 11.5–15.5)
WBC: 6.1 10*3/uL (ref 4.0–10.5)

## 2016-02-01 LAB — COMPREHENSIVE METABOLIC PANEL
ALK PHOS: 113 U/L (ref 38–126)
ALT: 20 U/L (ref 14–54)
AST: 17 U/L (ref 15–41)
Albumin: 3.8 g/dL (ref 3.5–5.0)
Anion gap: 9 (ref 5–15)
BUN: 15 mg/dL (ref 6–20)
CALCIUM: 9.3 mg/dL (ref 8.9–10.3)
CO2: 26 mmol/L (ref 22–32)
CREATININE: 0.7 mg/dL (ref 0.44–1.00)
Chloride: 101 mmol/L (ref 101–111)
GFR calc non Af Amer: >60 mL/min (ref >60)
Glucose, Bld: 119 mg/dL — ABNORMAL HIGH (ref 65–99)
Potassium: 3.2 mmol/L — ABNORMAL LOW (ref 3.5–5.1)
SODIUM: 136 mmol/L (ref 135–145)
Total Bilirubin: 0.1 mg/dL — ABNORMAL LOW (ref 0.3–1.2)
Total Protein: 8 g/dL (ref 6.5–8.1)

## 2016-02-01 LAB — LIPASE, BLOOD: Lipase: 14 U/L (ref 11–51)

## 2016-02-01 MED ORDER — MORPHINE SULFATE (PF) 4 MG/ML IV SOLN
4.0000 mg | Freq: Once | INTRAVENOUS | Status: DC
  Filled 2016-02-01: qty 1

## 2016-02-01 MED ORDER — FAMOTIDINE IN NACL 20-0.9 MG/50ML-% IV SOLN
20.0000 mg | Freq: Once | INTRAVENOUS | Status: AC
  Administered 2016-02-01: 20 mg via INTRAVENOUS
  Filled 2016-02-01: qty 50

## 2016-02-01 MED ORDER — OMEPRAZOLE 20 MG PO CPDR: 20.0000 mg | DELAYED_RELEASE_CAPSULE | Freq: Every day | ORAL | 0 refills | Status: DC

## 2016-02-01 NOTE — ED Notes (Signed)
Patient does not have a ride home. Unable to give morphine at this time.

## 2016-02-01 NOTE — ED Triage Notes (Signed)
Pt c/o right upper abd pain with n/d x 7 hrs

## 2016-02-01 NOTE — ED Provider Notes (Signed)
[facility] DEPT [facility] Provider Note   CSN: [id number] Arrival date & time: 02/01/16  1521  By signing my name below, I, Georgette Shell, attest that this documentation has been prepared under the direction and in the presence of Charlesetta Shanks, MD. Electronically Signed: Georgette Shell, ED Scribe. 02/01/16. 4:24 PM.  History   Chief Complaint Chief Complaint  Patient presents with  . Abdominal Pain   HPI Comments: Tonya Quinn is a 50 y.o. female with h/o DM and HTN who presents to the Emergency Department complaining of pressurized, 10/10 right upper abdominal pain onset 7 hours ago with associated nausea and diarrhea. Pt states this has been a intermittent, reoccurring issue that has just been worsening and constant lately. Pt reports pain is exacerbated when she has to urinate and movement. She has not tried any OTC medications PTA. Denies h/o gallbladder issues. Pt further denies dysuria, hematuria, fever, chills, or any other associated symptoms.   The history is provided by the patient. No language interpreter was used.    Past Medical History:  Diagnosis Date  . Asthma   . Atrial fibrillation ([place])   . DDD (degenerative disc disease), lumbar   . Diabetes mellitus   . HLD (hyperlipidemia)   . Hypertension   . Hypothyroidism   . Obesity   . Sleep apnea     Patient Active Problem List   Diagnosis Date Noted  . Weakness 04/08/2015  . Diabetes mellitus without complication ([place]) [id number]  . Anxiety 04/07/2015  . Left-sided weakness 04/07/2015  . Hypertension   . Asthma   . DDD (degenerative disc disease), lumbar   . Obesity   . HLD (hyperlipidemia)   . Hypothyroidism   . Essential hypertension   . Atrial fibrillation ([place])   . Fibroids 03/01/2012  . ALLERGIC RHINITIS 07/05/2006  . LOW BACK PAIN, CHRONIC 07/05/2006  . BACK PAIN 07/05/2006  . DYSURIA 07/05/2006    Past Surgical History:  Procedure Laterality Date  . ABDOMINAL HYSTERECTOMY    . CYSTOSCOPY   02/29/2012   Procedure: CYSTOSCOPY;  Surgeon: Cheri Fowler, MD;  Location: [facility];  Service: Gynecology;  Laterality: N/A;  . LAPAROSCOPIC ASSISTED VAGINAL HYSTERECTOMY  02/29/2012   Procedure: LAPAROSCOPIC ASSISTED VAGINAL HYSTERECTOMY;  Surgeon: Cheri Fowler, MD;  Location: [facility];  Service: Gynecology;  Laterality: N/A;  . SALPINGOOPHORECTOMY  02/29/2012   Procedure: SALPINGO OOPHORECTOMY;  Surgeon: Cheri Fowler, MD;  Location: [facility];  Service: Gynecology;  Laterality: Bilateral;  . uterine ablation    . VEIN SURGERY      OB History    No data available       Home Medications    Prior to Admission medications   Medication Sig Start Date End Date Taking? Authorizing Provider  thalidomide (THALOMID) 50 MG capsule Take 25 mg by mouth at bedtime. Take with water.   Yes Historical Provider, MD  albuterol (PROVENTIL HFA;VENTOLIN HFA) 108 (90 Base) MCG/ACT inhaler Inhale into the lungs.    Historical Provider, MD  ALPRAZolam Duanne Moron) 0.25 MG tablet Take 0.25 mg by mouth at bedtime as needed. For anxiety    Historical Provider, MD  amLODipine (NORVASC) 10 MG tablet Take 10 mg by mouth daily.    Historical Provider, MD  atenolol (TENORMIN) 50 MG tablet Take 25 mg by mouth daily.    Historical Provider, MD  Cholecalciferol (VITAMIN D PO) Take 1 tablet by mouth daily.    Historical Provider, MD  diclofenac sodium (VOLTAREN) 1 % GEL Apply 4 g  topically 4 (four) times daily. 07/15/15   April Palumbo, MD  FLUoxetine HCl (PROZAC PO) Take by mouth.    Historical Provider, MD  Insulin Lispro (HUMALOG [place]) Inject into the skin.    Historical Provider, MD  losartan (COZAAR) 100 MG tablet Take 100 mg by mouth daily.    Historical Provider, MD  omeprazole (PRILOSEC) 20 MG capsule Take 1 capsule (20 mg total) by mouth daily. 02/01/16   Charlesetta Shanks, MD  tiZANidine (ZANAFLEX) 4 MG capsule Take 4 mg by mouth 3 (three) times daily.    Historical Provider, MD    Family History Family History    Problem Relation Age of Onset  . Hypertension Brother   . Diabetes Brother     Social History Social History  Substance Use Topics  . Smoking status: Never Smoker  . Smokeless tobacco: Never Used  . Alcohol use No     Allergies   Lisinopril   Review of Systems Review of Systems 10 Systems reviewed and all are negative for acute change except as noted in the HPI. Physical Exam Updated Vital Signs BP 140/73 (BP Location: Left Arm)   Pulse 66   Temp 97.7 F (36.5 C) (Oral)   Resp 22   Ht 5\' 6"  (1.676 m)   Wt (!) 312 lb (141.5 kg)   LMP 02/16/2012   SpO2 98%   BMI 50.36 kg/m   Physical Exam  Constitutional: She appears well-developed and well-nourished.  HENT:  Head: Normocephalic.  Eyes: Conjunctivae are normal.  Cardiovascular: Normal rate, regular rhythm and normal heart sounds.  Exam reveals no gallop and no friction rub.   No murmur heard. Pulmonary/Chest: Effort normal and breath sounds normal. No respiratory distress. She has no wheezes. She has no rales.  Abdominal: Soft. Bowel sounds are normal. She exhibits no distension. There is tenderness. There is no guarding.  RUQ moderately tender to palpation, without guarding. Lower abdomen nontender, LUQ nontender.  Musculoskeletal: Normal range of motion. She exhibits no edema or tenderness.  No popliteal fossa tenderness, no calf tenderness. No peripheral edema.  Neurological: She is alert.  Skin: Skin is warm and dry.  Psychiatric: She has a normal mood and affect. Her behavior is normal.  Nursing note and vitals reviewed.    ED Treatments / Results  DIAGNOSTIC STUDIES: Oxygen Saturation is 100% on RA, normal by my interpretation.    COORDINATION OF CARE: 4:21 PM Discussed treatment plan with pt at bedside which includes blood work and pt agreed to plan.  Labs (all labs ordered are listed, but only abnormal results are displayed) Labs Reviewed  COMPREHENSIVE METABOLIC PANEL - Abnormal; Notable for  the following:       Result Value   Potassium 3.2 (*)    Glucose, Bld 119 (*)    Total Bilirubin 0.1 (*)    All other components within normal limits  CBC - Abnormal; Notable for the following:    Hemoglobin 11.6 (*)    All other components within normal limits  URINALYSIS, ROUTINE W REFLEX MICROSCOPIC (NOT AT [facility])  LIPASE, BLOOD    EKG  EKG Interpretation None       Radiology US Abdomen Complete  Result Date: 02/01/2016 CLINICAL DATA:  Right upper quadrant pain and back pain for 10 hour EXAM: ABDOMEN ULTRASOUND COMPLETE COMPARISON:  02/03/2015 FINDINGS: Gallbladder: No gallstones or wall thickening visualized. No sonographic Murphy sign noted by sonographer. Common bile duct: Diameter: 2 mm Liver: Diffusely increased in echogenicity without focal mass.  IVC: Obscured. Pancreas: Obscured. Spleen: Size and appearance within normal limits. Right Kidney: Length: 13.4 cm. Echogenicity within normal limits. No mass or hydronephrosis visualized. Left Kidney: Length: 12.4 cm. Echogenicity within normal limits. No mass or hydronephrosis visualized. A small pelvic simple cyst is suspected measuring 1.5 cm Abdominal aorta: Partially obscured.  No obvious aneurysm. Other findings: None. IMPRESSION: Exam was somewhat limited. Diffuse hepatic steatosis. Electronically Signed   By: Marybelle Killings M.D.   On: 02/01/2016 18:21    Procedures Procedures (including critical care time)  Medications Ordered in ED Medications  morphine 4 MG/ML injection 4 mg (4 mg Intravenous Not Given 02/01/16 1852)  famotidine (PEPCID) IVPB 20 mg premix (0 mg Intravenous Stopped 02/01/16 1812)     Initial Impression / Assessment and Plan / ED Course  I have reviewed the triage vital signs and the nursing notes.  Pertinent labs & imaging results that were available during my care of the patient were reviewed by me and considered in my medical decision making (see chart for details).  Clinical Course     Final  Clinical Impressions(s) / ED Diagnoses   Final diagnoses:  Right upper quadrant abdominal pain   Patient has had problems with recurrent right upper quadrant abdominal pain. She reports that she is never had a specific diagnosis. Her abdominal exam is nonsurgical. Symptoms sound most suggestive of biliary colic however ultrasound does not show stones. Other consideration is for gastritis. Patient will be instructed this time to follow dietary instructions for gastritis and gallbladder disease. She is to start daily omeprazole. Patient is advised to follow-up with her family doctor for evaluation of response to treatment and consideration for HIDA scan if she has not had one previously.  New Prescriptions New Prescriptions   OMEPRAZOLE (PRILOSEC) 20 MG CAPSULE    Take 1 capsule (20 mg total) by mouth daily.       Charlesetta Shanks, MD 02/01/16 [phone]

## 2016-05-14 ENCOUNTER — Emergency Department (HOSPITAL_BASED_OUTPATIENT_CLINIC_OR_DEPARTMENT_OTHER)
Admission: EM | Admit: 2016-05-14 | Discharge: 2016-05-14 | Disposition: A | Discharge status: Home / Self Care | Payer: Medicaid Other | Attending: Emergency Medicine | Admitting: Emergency Medicine

## 2016-05-14 ENCOUNTER — Emergency Department (HOSPITAL_BASED_OUTPATIENT_CLINIC_OR_DEPARTMENT_OTHER): Payer: Medicaid Other

## 2016-05-14 ENCOUNTER — Encounter (HOSPITAL_BASED_OUTPATIENT_CLINIC_OR_DEPARTMENT_OTHER): Payer: Self-pay

## 2016-05-14 DIAGNOSIS — E039 Hypothyroidism, unspecified: Secondary | ICD-10-CM | POA: Insufficient documentation

## 2016-05-14 DIAGNOSIS — E119 Type 2 diabetes mellitus without complications: Secondary | ICD-10-CM | POA: Diagnosis not present

## 2016-05-14 DIAGNOSIS — J45901 Unspecified asthma with (acute) exacerbation: Secondary | ICD-10-CM | POA: Diagnosis not present

## 2016-05-14 DIAGNOSIS — Z7984 Long term (current) use of oral hypoglycemic drugs: Secondary | ICD-10-CM | POA: Insufficient documentation

## 2016-05-14 DIAGNOSIS — Z79899 Other long term (current) drug therapy: Secondary | ICD-10-CM | POA: Insufficient documentation

## 2016-05-14 DIAGNOSIS — I10 Essential (primary) hypertension: Secondary | ICD-10-CM | POA: Insufficient documentation

## 2016-05-14 DIAGNOSIS — R0602 Shortness of breath: Secondary | ICD-10-CM | POA: Diagnosis present

## 2016-05-14 MED ORDER — IPRATROPIUM BROMIDE 0.02 % IN SOLN
0.5000 mg | Freq: Once | RESPIRATORY_TRACT | Status: AC
  Administered 2016-05-14: 0.5 mg via RESPIRATORY_TRACT
  Filled 2016-05-14: qty 2.5

## 2016-05-14 MED ORDER — ALBUTEROL SULFATE (2.5 MG/3ML) 0.083% IN NEBU
INHALATION_SOLUTION | RESPIRATORY_TRACT | Status: AC
  Administered 2016-05-14: 2.5 mg
  Filled 2016-05-14: qty 3

## 2016-05-14 MED ORDER — IPRATROPIUM-ALBUTEROL 0.5-2.5 (3) MG/3ML IN SOLN
RESPIRATORY_TRACT | Status: AC
  Administered 2016-05-14: 3 mL
  Filled 2016-05-14: qty 3

## 2016-05-14 MED ORDER — CETIRIZINE HCL 10 MG PO CAPS: 10.0000 mg | ORAL_CAPSULE | Freq: Every day | ORAL | 11 refills | Status: DC

## 2016-05-14 MED ORDER — PREDNISONE 20 MG PO TABS: 40.0000 mg | ORAL_TABLET | Freq: Every day | ORAL | 0 refills | Status: DC

## 2016-05-14 MED ORDER — ALBUTEROL SULFATE (2.5 MG/3ML) 0.083% IN NEBU
5.0000 mg | INHALATION_SOLUTION | Freq: Once | RESPIRATORY_TRACT | Status: AC
  Administered 2016-05-14: 5 mg via RESPIRATORY_TRACT
  Filled 2016-05-14: qty 6

## 2016-05-14 MED ORDER — PREDNISONE 10 MG PO TABS
60.0000 mg | ORAL_TABLET | Freq: Once | ORAL | Status: AC
  Administered 2016-05-14: 60 mg via ORAL
  Filled 2016-05-14: qty 1

## 2016-05-14 NOTE — ED Notes (Signed)
Pt presented for re-evaluation. Pt stating she is starting to have a hard time breathing again and that if feels like her heart is racing.

## 2016-05-14 NOTE — ED Notes (Signed)
Patient transported to X-ray 

## 2016-05-14 NOTE — ED Notes (Signed)
Pt given d/c instructions as pe chart. rx x 2. Verbalizes understanding. No questions.

## 2016-05-14 NOTE — ED Triage Notes (Signed)
Pt reports ShOB that started today and suddenly became worse x 1 hour ago. Pt in obvious distress. Pt has hx of asthma. Lung sounds clear in upper lobes and diminished in the bases.

## 2016-05-14 NOTE — ED Provider Notes (Signed)
[facility] DEPT [facility] Provider Note   CSN: [id number] Arrival date & time: 05/14/16  1751  By signing my name below, I, Avnee Patel, attest that this documentation has been prepared under the direction and in the presence of Blanchie Dessert, MD  Electronically Signed: Delton [place], ED Scribe. 05/14/16. 8:06 PM.  History   Chief Complaint Chief Complaint  Patient presents with  . Shortness of Breath   The history is provided by the patient. No language interpreter was used.   HPI Comments:  Tonya Quinn is a 51 y.o. female, with a hx of A-fib, DM, HTN and hyperlipidemia, who presents to the Emergency Department complaining of acute onset, moderate SOB onset today. Pt also reports an intermittent productive cough x 1 week, wheezing x 1 week and left sided chest pain which she describes as a pressure. She has used her inhaler with mild relief but notes it did not provide relief today. She has also had a breathing treatment in the ED with mild relief. Pt is a non-smoker.   Past Medical History:  Diagnosis Date  . Asthma   . Atrial fibrillation ([place])   . DDD (degenerative disc disease), lumbar   . Diabetes mellitus   . HLD (hyperlipidemia)   . Hypertension   . Hypothyroidism   . Obesity   . Sleep apnea     Patient Active Problem List   Diagnosis Date Noted  . Weakness 04/08/2015  . Diabetes mellitus without complication ([place]) [id number]  . Anxiety 04/07/2015  . Left-sided weakness 04/07/2015  . Hypertension   . Asthma   . DDD (degenerative disc disease), lumbar   . Obesity   . HLD (hyperlipidemia)   . Hypothyroidism   . Essential hypertension   . Atrial fibrillation ([place])   . Fibroids 03/01/2012  . ALLERGIC RHINITIS 07/05/2006  . LOW BACK PAIN, CHRONIC 07/05/2006  . BACK PAIN 07/05/2006  . DYSURIA 07/05/2006    Past Surgical History:  Procedure Laterality Date  . ABDOMINAL HYSTERECTOMY    . CYSTOSCOPY  02/29/2012   Procedure: CYSTOSCOPY;  Surgeon: Cheri Fowler, MD;  Location: [facility];  Service: Gynecology;  Laterality: N/A;  . LAPAROSCOPIC ASSISTED VAGINAL HYSTERECTOMY  02/29/2012   Procedure: LAPAROSCOPIC ASSISTED VAGINAL HYSTERECTOMY;  Surgeon: Cheri Fowler, MD;  Location: [facility];  Service: Gynecology;  Laterality: N/A;  . SALPINGOOPHORECTOMY  02/29/2012   Procedure: SALPINGO OOPHORECTOMY;  Surgeon: Cheri Fowler, MD;  Location: [facility];  Service: Gynecology;  Laterality: Bilateral;  . uterine ablation    . VEIN SURGERY      OB History    No data available       Home Medications    Prior to Admission medications   Medication Sig Start Date End Date Taking? Authorizing Provider  albuterol (PROVENTIL HFA;VENTOLIN HFA) 108 (90 Base) MCG/ACT inhaler Inhale into the lungs.    Historical Provider, MD  ALPRAZolam Duanne Moron) 0.25 MG tablet Take 0.25 mg by mouth at bedtime as needed. For anxiety    Historical Provider, MD  amLODipine (NORVASC) 10 MG tablet Take 10 mg by mouth daily.    Historical Provider, MD  atenolol (TENORMIN) 50 MG tablet Take 25 mg by mouth daily.    Historical Provider, MD  Cholecalciferol (VITAMIN D PO) Take 1 tablet by mouth daily.    Historical Provider, MD  diclofenac sodium (VOLTAREN) 1 % GEL Apply 4 g topically 4 (four) times daily. 07/15/15   April Palumbo, MD  FLUoxetine HCl (PROZAC PO) Take by mouth.  Historical Provider, MD  Insulin Lispro (HUMALOG [place]) Inject into the skin.    Historical Provider, MD  losartan (COZAAR) 100 MG tablet Take 100 mg by mouth daily.    Historical Provider, MD  omeprazole (PRILOSEC) 20 MG capsule Take 1 capsule (20 mg total) by mouth daily. 02/01/16   Charlesetta Shanks, MD  thalidomide (THALOMID) 50 MG capsule Take 25 mg by mouth at bedtime. Take with water.    Historical Provider, MD  tiZANidine (ZANAFLEX) 4 MG capsule Take 4 mg by mouth 3 (three) times daily.    Historical Provider, MD    Family History Family History  Problem Relation Age of Onset  . Hypertension Brother   .  Diabetes Brother     Social History Social History  Substance Use Topics  . Smoking status: Never Smoker  . Smokeless tobacco: Never Used  . Alcohol use No     Allergies   Lisinopril   Review of Systems Review of Systems  Respiratory: Positive for cough and shortness of breath. Negative for wheezing.   Cardiovascular: Positive for chest pain.  All other systems reviewed and are negative.  Physical Exam Updated Vital Signs BP (!) 156/103 (BP Location: Left Arm)   Pulse 87   Temp 97.7 F (36.5 C) (Oral)   Resp 24   Ht 5\' 6"  (1.676 m)   Wt (!) 320 lb (145.2 kg)   LMP 02/16/2012   SpO2 100%   BMI 51.65 kg/m   Physical Exam  Constitutional: She is oriented to person, place, and time. She appears well-developed and well-nourished. No distress.  Morbidly obese   HENT:  Head: Normocephalic and atraumatic.  Eyes: EOM are normal.  Neck: Normal range of motion.  Cardiovascular: Normal rate, regular rhythm and normal heart sounds.   Pulmonary/Chest: Effort normal. No accessory muscle usage. Tachypnea noted. She has decreased breath sounds. She has no wheezes.  Tachpneic. Globally decreased breath sounds. No accessory muscle use. No wheezing.   Abdominal: Soft. She exhibits no distension. There is no tenderness.  Musculoskeletal: Normal range of motion.  Neurological: She is alert and oriented to person, place, and time.  Skin: Skin is warm and dry.  Psychiatric: She has a normal mood and affect. Judgment normal.  Nursing note and vitals reviewed.  ED Treatments / Results  DIAGNOSTIC STUDIES:  Oxygen Saturation is 100% on RA, normal by my interpretation.    COORDINATION OF CARE:  7:31 PM Discussed treatment plan with pt at bedside and pt agreed to plan.  Labs (all labs ordered are listed, but only abnormal results are displayed) Labs Reviewed - No data to display  EKG  EKG Interpretation  Date/Time:  Saturday May 14 2016 19:08:57 EST Ventricular Rate:   83 PR Interval:  162 QRS Duration: 80 QT Interval:  412 QTC Calculation: 484 R Axis:   0 Text Interpretation:  Normal sinus rhythm Moderate voltage criteria for LVH, may be normal variant Inferior infarct , age undetermined No significant change since last tracing Confirmed by [facility]  MD, Sudiksha Victor ([id number]) on 05/14/2016 7:49:44 PM       Radiology Dg Chest 2 View  Result Date: 05/14/2016 CLINICAL DATA:  Cough w/ chest tightness and wheezing x 1-2 wks. No fever. Hx asthma and bronchitis. ex smoker. htn and diab. EXAM: CHEST  2 VIEW COMPARISON:  None. FINDINGS: Lateral view degraded by patient arm position. The Chin overlies the right apex. Midline trachea. Mild cardiomegaly. No pleural effusion or pneumothorax. No congestive failure. Clear lungs.  IMPRESSION: Mild cardiomegaly, without congestive failure or acute disease. Electronically Signed   By: Abigail Miyamoto M.D.   On: 05/14/2016 20:29    Procedures Procedures (including critical care time)  Medications Ordered in ED Medications  albuterol (PROVENTIL) (2.5 MG/3ML) 0.083% nebulizer solution (2.5 mg  Given 05/14/16 1803)  ipratropium-albuterol (DUONEB) 0.5-2.5 (3) MG/3ML nebulizer solution (3 mLs  Given 05/14/16 1803)     Initial Impression / Assessment and Plan / ED Course  I have reviewed the triage vital signs and the nursing notes.  Pertinent labs & imaging results that were available during my care of the patient were reviewed by me and considered in my medical decision making (see chart for details).     Patient with a history of asthma with symptoms consistent with an asthma exacerbation most likely caused by allergies. She states this always happens when the pollen starts increasing. She denies any infectious symptoms. She has some chest tightness and discomfort which she states she always gets with her asthma. Low suspicion for ACS. EKG without acute findings. Chest x-ray without acute findings. After 2 nebs and prednisone patient  feels much better. Satting 100%. Will discharge home.  Final Clinical Impressions(s) / ED Diagnoses   Final diagnoses:  Moderate asthma with exacerbation, unspecified whether persistent    New Prescriptions Discharge Medication List as of 05/14/2016  8:45 PM    START taking these medications   Details  Cetirizine HCl (ZYRTEC ALLERGY) 10 MG CAPS Take 1 capsule (10 mg total) by mouth daily., Starting Sat 05/14/2016, Print    predniSONE (DELTASONE) 20 MG tablet Take 2 tablets (40 mg total) by mouth daily., Starting Sat 05/14/2016, Print       I personally performed the services described in this documentation, which was scribed in my presence.  The recorded information has been reviewed and considered.    Blanchie Dessert, MD 05/14/16 [phone]

## 2016-06-02 ENCOUNTER — Encounter (HOSPITAL_BASED_OUTPATIENT_CLINIC_OR_DEPARTMENT_OTHER): Payer: Self-pay | Admitting: *Deleted

## 2016-06-02 ENCOUNTER — Emergency Department (HOSPITAL_BASED_OUTPATIENT_CLINIC_OR_DEPARTMENT_OTHER)
Admission: EM | Admit: 2016-06-02 | Discharge: 2016-06-02 | Disposition: A | Discharge status: Home / Self Care | Payer: Medicaid Other | Attending: Emergency Medicine | Admitting: Emergency Medicine

## 2016-06-02 DIAGNOSIS — Z79899 Other long term (current) drug therapy: Secondary | ICD-10-CM | POA: Insufficient documentation

## 2016-06-02 DIAGNOSIS — I10 Essential (primary) hypertension: Secondary | ICD-10-CM | POA: Insufficient documentation

## 2016-06-02 DIAGNOSIS — E039 Hypothyroidism, unspecified: Secondary | ICD-10-CM | POA: Diagnosis not present

## 2016-06-02 DIAGNOSIS — E119 Type 2 diabetes mellitus without complications: Secondary | ICD-10-CM | POA: Insufficient documentation

## 2016-06-02 DIAGNOSIS — J40 Bronchitis, not specified as acute or chronic: Secondary | ICD-10-CM | POA: Diagnosis not present

## 2016-06-02 DIAGNOSIS — R0602 Shortness of breath: Secondary | ICD-10-CM | POA: Diagnosis present

## 2016-06-02 MED ORDER — AZITHROMYCIN 250 MG PO TABS: ORAL_TABLET | ORAL | 0 refills | Status: DC

## 2016-06-02 MED ORDER — BENZONATATE 100 MG PO CAPS: 100.0000 mg | ORAL_CAPSULE | Freq: Three times a day (TID) | ORAL | 0 refills | Status: DC

## 2016-06-02 MED ORDER — HYDROCODONE-HOMATROPINE 5-1.5 MG/5ML PO SYRP: 5.0000 mL | ORAL_SOLUTION | Freq: Four times a day (QID) | ORAL | 0 refills | Status: DC | PRN

## 2016-06-02 NOTE — ED Provider Notes (Signed)
[facility] Provider Note   CSN: [id number] Arrival date & time: 06/02/16  1737   By signing my name below, I, Charolotte Eke, attest that this documentation has been prepared under the direction and in the presence of Charlesetta Shanks, MD. Electronically Signed: Charolotte Eke, Scribe. 06/02/16. 7:28 PM.    History   Chief Complaint Chief Complaint  Patient presents with  . Shortness of Breath    HPI Tonya Quinn is a 51 y.o. female with h/o HTN, HLD, DM, asthma who presents to the Emergency Department complaining of gradual onset, moderate, constant shortness of breath that worsened today. Pt has associated cough for the last 7 days which produces phlegm, her heart racing, central chest pain with cough, nasal congestion, sinus pressure. Pt uses breathing tx and inhaler at home. Pt came to ED for similar symptoms 7 days ago and was given prednisone with little to no relief. Pt has leg swelling at baseline. Pt is a non-smoker, but lives in a smoking environment. Pt is allergic to Lisinopril. Pt denies fever, emesis, diarrhea.  The history is provided by the patient. No language interpreter was used.    Past Medical History:  Diagnosis Date  . Asthma   . Atrial fibrillation ([place])   . DDD (degenerative disc disease), lumbar   . Diabetes mellitus   . HLD (hyperlipidemia)   . Hypertension   . Hypothyroidism   . Obesity   . Sleep apnea     Patient Active Problem List   Diagnosis Date Noted  . Weakness 04/08/2015  . Diabetes mellitus without complication ([place]) 62/22/9798  . Anxiety 04/07/2015  . Left-sided weakness 04/07/2015  . Hypertension   . Asthma   . DDD (degenerative disc disease), lumbar   . Obesity   . HLD (hyperlipidemia)   . Hypothyroidism   . Essential hypertension   . Atrial fibrillation ([place])   . Fibroids 03/01/2012  . ALLERGIC RHINITIS 07/05/2006  . LOW BACK PAIN, CHRONIC 07/05/2006  . BACK PAIN 07/05/2006  . DYSURIA 07/05/2006    Past Surgical  History:  Procedure Laterality Date  . ABDOMINAL HYSTERECTOMY    . CYSTOSCOPY  02/29/2012   Procedure: CYSTOSCOPY;  Surgeon: Cheri Fowler, MD;  Location: [facility];  Service: Gynecology;  Laterality: N/A;  . LAPAROSCOPIC ASSISTED VAGINAL HYSTERECTOMY  02/29/2012   Procedure: LAPAROSCOPIC ASSISTED VAGINAL HYSTERECTOMY;  Surgeon: Cheri Fowler, MD;  Location: [facility];  Service: Gynecology;  Laterality: N/A;  . SALPINGOOPHORECTOMY  02/29/2012   Procedure: SALPINGO OOPHORECTOMY;  Surgeon: Cheri Fowler, MD;  Location: [facility];  Service: Gynecology;  Laterality: Bilateral;  . uterine ablation    . VEIN SURGERY      OB History    No data available       Home Medications    Prior to Admission medications   Medication Sig Start Date End Date Taking? Authorizing Provider  albuterol (PROVENTIL HFA;VENTOLIN HFA) 108 (90 Base) MCG/ACT inhaler Inhale into the lungs.    Historical Provider, MD  ALPRAZolam Duanne Moron) 0.25 MG tablet Take 0.25 mg by mouth at bedtime as needed. For anxiety    Historical Provider, MD  amLODipine (NORVASC) 10 MG tablet Take 10 mg by mouth daily.    Historical Provider, MD  atenolol (TENORMIN) 50 MG tablet Take 25 mg by mouth daily.    Historical Provider, MD  azithromycin (ZITHROMAX Z-PAK) 250 MG tablet 2 po day one, then 1 daily x 4 days 06/02/16   Charlesetta Shanks, MD  benzonatate (TESSALON) 100  MG capsule Take 1 capsule (100 mg total) by mouth every 8 (eight) hours. 06/02/16   Charlesetta Shanks, MD  Cetirizine HCl (ZYRTEC ALLERGY) 10 MG CAPS Take 1 capsule (10 mg total) by mouth daily. 05/14/16   Blanchie Dessert, MD  Cholecalciferol (VITAMIN D PO) Take 1 tablet by mouth daily.    Historical Provider, MD  diclofenac sodium (VOLTAREN) 1 % GEL Apply 4 g topically 4 (four) times daily. 07/15/15   April Palumbo, MD  FLUoxetine HCl (PROZAC PO) Take by mouth.    Historical Provider, MD  HYDROcodone-homatropine (HYCODAN) 5-1.5 MG/5ML syrup Take 5 mLs by mouth every 6 (six) hours as needed  for cough. 06/02/16   Charlesetta Shanks, MD  Insulin Lispro (HUMALOG [place]) Inject into the skin.    Historical Provider, MD  losartan (COZAAR) 100 MG tablet Take 100 mg by mouth daily.    Historical Provider, MD  omeprazole (PRILOSEC) 20 MG capsule Take 1 capsule (20 mg total) by mouth daily. 02/01/16   Charlesetta Shanks, MD  predniSONE (DELTASONE) 20 MG tablet Take 2 tablets (40 mg total) by mouth daily. 05/14/16   Blanchie Dessert, MD  thalidomide (THALOMID) 50 MG capsule Take 25 mg by mouth at bedtime. Take with water.    Historical Provider, MD  tiZANidine (ZANAFLEX) 4 MG capsule Take 4 mg by mouth 3 (three) times daily.    Historical Provider, MD    Family History Family History  Problem Relation Age of Onset  . Hypertension Brother   . Diabetes Brother     Social History Social History  Substance Use Topics  . Smoking status: Never Smoker  . Smokeless tobacco: Never Used  . Alcohol use No     Allergies   Lisinopril   Review of Systems Review of Systems  Respiratory: Positive for shortness of breath.     A complete 10 system review of systems was obtained and all systems are negative except as noted in the HPI and PMH.    Physical Exam Updated Vital Signs BP (!) 146/87 (BP Location: Left Arm)   Pulse (!) 108   Temp 97.4 F (36.3 C) (Oral)   Resp (!) 22   LMP 02/16/2012   SpO2 100%   Physical Exam  Constitutional: She is oriented to person, place, and time. She appears well-developed and well-nourished.  HENT:  Head: Normocephalic and atraumatic.  Nose: Nose normal.  Mouth/Throat: Oropharynx is clear and moist.  Eyes: EOM are normal.  Neck: Neck supple.  Cardiovascular: Normal rate, regular rhythm, normal heart sounds and intact distal pulses.  Exam reveals no gallop and no friction rub.   No murmur heard. Pulmonary/Chest: Effort normal and breath sounds normal. No respiratory distress. She has no wheezes. She has no rales.  Patient has intermittent dry paroxysmal  cough. No respiratory distress. Good air flow to the bases without any appreciable wheeze.  Abdominal: Soft. She exhibits no distension. There is no tenderness. There is no guarding.  Musculoskeletal: She exhibits no edema or tenderness.  Neurological: She is alert and oriented to person, place, and time. No cranial nerve deficit. She exhibits normal muscle tone. Coordination normal.  Skin: Skin is warm and dry.  Psychiatric: She has a normal mood and affect.  Nursing note and vitals reviewed.    ED Treatments / Results   DIAGNOSTIC STUDIES: Oxygen Saturation is 100% on room air, normal by my interpretation.    COORDINATION OF CARE: 7:26 PM Discussed treatment plan with pt at bedside and pt agreed  to plan, which includes a review of her chart and possible Z-pak.    Labs (all labs ordered are listed, but only abnormal results are displayed) Labs Reviewed - No data to display  EKG  EKG Interpretation None       Radiology No results found.  Procedures Procedures (including critical care time)  Medications Ordered in ED Medications - No data to display   Initial Impression / Assessment and Plan / ED Course  I have reviewed the triage vital signs and the nursing notes.  Pertinent labs & imaging results that were available during my care of the patient were reviewed by me and considered in my medical decision making (see chart for details).      Final Clinical Impressions(s) / ED Diagnoses   Final diagnoses:  Bronchitis  Patient reports ongoing cough despite treatment for asthma\acute bronchitis with prednisone and inhaler. The patient reports cough is intermittently productive of sputum and is giving her central chest pain with coughing episodes. She does not have documented fever. Patient is nontoxic. She does have good air flow to the distal lung fields without wheeze. At this time I do not feel that she is having significant asthma exacerbation. Persistent symptoms  patient will be treated for atypical pneumonia with a course of Zithromax and Tessalon Perles and Hycodan for symptoms. She is counseled to follow-up with her PCP for recheck.  New Prescriptions New Prescriptions   AZITHROMYCIN (ZITHROMAX Z-PAK) 250 MG TABLET    2 po day one, then 1 daily x 4 days   BENZONATATE (TESSALON) 100 MG CAPSULE    Take 1 capsule (100 mg total) by mouth every 8 (eight) hours.   HYDROCODONE-HOMATROPINE (HYCODAN) 5-1.5 MG/5ML SYRUP    Take 5 mLs by mouth every 6 (six) hours as needed for cough.       Charlesetta Shanks, MD 06/02/16 [phone]

## 2016-06-02 NOTE — ED Triage Notes (Signed)
SOB and heart palpitations x 3 hours. Able to speak in complete sentences. Lungs clear. Drove herself here. States she used her Albuterol inhaler on the way here.

## 2016-06-09 ENCOUNTER — Emergency Department (HOSPITAL_BASED_OUTPATIENT_CLINIC_OR_DEPARTMENT_OTHER)
Admission: EM | Admit: 2016-06-09 | Discharge: 2016-06-10 | Disposition: A | Discharge status: Home / Self Care | Payer: Medicaid Other | Attending: Emergency Medicine | Admitting: Emergency Medicine

## 2016-06-09 ENCOUNTER — Emergency Department (HOSPITAL_BASED_OUTPATIENT_CLINIC_OR_DEPARTMENT_OTHER): Payer: Medicaid Other

## 2016-06-09 ENCOUNTER — Encounter (HOSPITAL_BASED_OUTPATIENT_CLINIC_OR_DEPARTMENT_OTHER): Payer: Self-pay | Admitting: Emergency Medicine

## 2016-06-09 DIAGNOSIS — R0602 Shortness of breath: Secondary | ICD-10-CM | POA: Diagnosis present

## 2016-06-09 DIAGNOSIS — M7989 Other specified soft tissue disorders: Secondary | ICD-10-CM | POA: Insufficient documentation

## 2016-06-09 DIAGNOSIS — E119 Type 2 diabetes mellitus without complications: Secondary | ICD-10-CM | POA: Insufficient documentation

## 2016-06-09 DIAGNOSIS — I10 Essential (primary) hypertension: Secondary | ICD-10-CM | POA: Insufficient documentation

## 2016-06-09 DIAGNOSIS — E039 Hypothyroidism, unspecified: Secondary | ICD-10-CM | POA: Insufficient documentation

## 2016-06-09 DIAGNOSIS — J45909 Unspecified asthma, uncomplicated: Secondary | ICD-10-CM | POA: Insufficient documentation

## 2016-06-09 DIAGNOSIS — R05 Cough: Secondary | ICD-10-CM | POA: Diagnosis not present

## 2016-06-09 DIAGNOSIS — Z794 Long term (current) use of insulin: Secondary | ICD-10-CM | POA: Diagnosis not present

## 2016-06-09 LAB — BASIC METABOLIC PANEL
ANION GAP: 10 (ref 5–15)
BUN: 20 mg/dL (ref 6–20)
CALCIUM: 9.5 mg/dL (ref 8.9–10.3)
CO2: 22 mmol/L (ref 22–32)
Chloride: 106 mmol/L (ref 101–111)
Creatinine, Ser: 1 mg/dL (ref 0.44–1.00)
GFR calc non Af Amer: >60 mL/min (ref >60)
Glucose, Bld: 169 mg/dL — ABNORMAL HIGH (ref 65–99)
Potassium: 3.2 mmol/L — ABNORMAL LOW (ref 3.5–5.1)
Sodium: 138 mmol/L (ref 135–145)

## 2016-06-09 LAB — CBC WITH DIFFERENTIAL/PLATELET
BASOS ABS: 0 10*3/uL (ref 0.0–0.1)
BASOS PCT: 0 %
Eosinophils Absolute: 0.1 10*3/uL (ref 0.0–0.7)
Eosinophils Relative: 1 %
HEMATOCRIT: 36.3 % (ref 36.0–46.0)
HEMOGLOBIN: 11.7 g/dL — AB (ref 12.0–15.0)
Lymphocytes Relative: 22 %
Lymphs Abs: 1.7 10*3/uL (ref 0.7–4.0)
MCH: 28 pg (ref 26.0–34.0)
MCHC: 32.2 g/dL (ref 30.0–36.0)
MCV: 86.8 fL (ref 78.0–100.0)
MONOS PCT: 4 %
Monocytes Absolute: 0.3 10*3/uL (ref 0.1–1.0)
NEUTROS ABS: 5.8 10*3/uL (ref 1.7–7.7)
NEUTROS PCT: 73 %
Platelets: 194 10*3/uL (ref 150–400)
RBC: 4.18 MIL/uL (ref 3.87–5.11)
RDW: 14.9 % (ref 11.5–15.5)
WBC: 7.9 10*3/uL (ref 4.0–10.5)

## 2016-06-09 LAB — BRAIN NATRIURETIC PEPTIDE: B Natriuretic Peptide: 22.6 pg/mL (ref 0.0–100.0)

## 2016-06-09 LAB — TROPONIN I: Troponin I: <0.03 ng/mL (ref <0.03)

## 2016-06-09 MED ORDER — IOPAMIDOL (ISOVUE-370) INJECTION 76%
100.0000 mL | Freq: Once | INTRAVENOUS | Status: AC | PRN
  Administered 2016-06-10: 100 mL via INTRAVENOUS

## 2016-06-09 NOTE — ED Notes (Signed)
Patient transported to X-ray 

## 2016-06-09 NOTE — ED Provider Notes (Signed)
[facility] Provider Note   CSN: [id number] Arrival date & time: 06/09/16  2225  By signing my name below, I, Jeanell Sparrow, attest that this documentation has been prepared under the direction and in the presence of non-physician practitioner, Ozella Almond Daryl Beehler, PA-C. Electronically Signed: Jeanell Sparrow, Scribe. 06/09/2016. 10:45 PM.  History   Chief Complaint Chief Complaint  Patient presents with  . Shortness of Breath   The history is provided by the patient. No language interpreter was used.   HPI Comments: Tonya Quinn is a 51 y.o. female with a PMHx of asthma, DM, HTN, obesity, HLD who presents to the Emergency Department complaining of constant worsening SOB that started about a month ago. Shortness of breath is worse when lying flat. She was seen on 06/02/16 for the same complaint and treated for Bronchitis with Tessalon [place] and a Z-pack. She took ABX as directed until completion and finshed 3 days ago. She was also seen on 05/14/16 for asthma exacerbation. She reports having an asthma attack today that was unrelieved by her home albuterol inhaler x 3 today as well as one home nebulizer treatment. She was given 125 mg Solumedrol, 10 mg Albuterol, and 0.5 mg Atrovent by EMS prior to arrival with some relief. Her SOB is exacerbated by exertion and sitting down. She reports associated cough and leg swelling (onset 2-3 months ago, somewhat improving). She denies any history or lower extremity swelling. She denies any fever/chills, chest pain, abdominal pain, n/v.   PCP: Marry Guan  Past Medical History:  Diagnosis Date  . Asthma   . Atrial fibrillation ([place])   . DDD (degenerative disc disease), lumbar   . Diabetes mellitus   . HLD (hyperlipidemia)   . Hypertension   . Hypothyroidism   . Obesity   . Sleep apnea     Patient Active Problem List   Diagnosis Date Noted  . Weakness 04/08/2015  . Diabetes mellitus without complication ([place]) 97/04/6376  .  Anxiety 04/07/2015  . Left-sided weakness 04/07/2015  . Hypertension   . Asthma   . DDD (degenerative disc disease), lumbar   . Obesity   . HLD (hyperlipidemia)   . Hypothyroidism   . Essential hypertension   . Atrial fibrillation ([place])   . Fibroids 03/01/2012  . ALLERGIC RHINITIS 07/05/2006  . LOW BACK PAIN, CHRONIC 07/05/2006  . BACK PAIN 07/05/2006  . DYSURIA 07/05/2006    Past Surgical History:  Procedure Laterality Date  . ABDOMINAL HYSTERECTOMY    . CYSTOSCOPY  02/29/2012   Procedure: CYSTOSCOPY;  Surgeon: Cheri Fowler, MD;  Location: [facility];  Service: Gynecology;  Laterality: N/A;  . LAPAROSCOPIC ASSISTED VAGINAL HYSTERECTOMY  02/29/2012   Procedure: LAPAROSCOPIC ASSISTED VAGINAL HYSTERECTOMY;  Surgeon: Cheri Fowler, MD;  Location: [facility];  Service: Gynecology;  Laterality: N/A;  . SALPINGOOPHORECTOMY  02/29/2012   Procedure: SALPINGO OOPHORECTOMY;  Surgeon: Cheri Fowler, MD;  Location: [facility];  Service: Gynecology;  Laterality: Bilateral;  . uterine ablation    . VEIN SURGERY      OB History    No data available       Home Medications    Prior to Admission medications   Medication Sig Start Date End Date Taking? Authorizing Provider  albuterol (PROVENTIL HFA;VENTOLIN HFA) 108 (90 Base) MCG/ACT inhaler Inhale into the lungs.    Historical Provider, MD  ALPRAZolam Duanne Moron) 0.25 MG tablet Take 0.25 mg by mouth at bedtime as needed. For anxiety    Historical Provider, MD  amLODipine (NORVASC) 10 MG tablet Take 10 mg by mouth daily.    Historical Provider, MD  atenolol (TENORMIN) 50 MG tablet Take 25 mg by mouth daily.    Historical Provider, MD  azithromycin (ZITHROMAX Z-PAK) 250 MG tablet 2 po day one, then 1 daily x 4 days 06/02/16   Charlesetta Shanks, MD  benzonatate (TESSALON) 100 MG capsule Take 1 capsule (100 mg total) by mouth every 8 (eight) hours. 06/02/16   Charlesetta Shanks, MD  Cetirizine HCl (ZYRTEC ALLERGY) 10 MG CAPS Take 1 capsule (10 mg total) by mouth  daily. 05/14/16   Blanchie Dessert, MD  Cholecalciferol (VITAMIN D PO) Take 1 tablet by mouth daily.    Historical Provider, MD  diclofenac sodium (VOLTAREN) 1 % GEL Apply 4 g topically 4 (four) times daily. 07/15/15   April Palumbo, MD  FLUoxetine HCl (PROZAC PO) Take by mouth.    Historical Provider, MD  HYDROcodone-homatropine (HYCODAN) 5-1.5 MG/5ML syrup Take 5 mLs by mouth every 6 (six) hours as needed for cough. 06/02/16   Charlesetta Shanks, MD  Insulin Lispro (HUMALOG [place]) Inject into the skin.    Historical Provider, MD  losartan (COZAAR) 100 MG tablet Take 100 mg by mouth daily.    Historical Provider, MD  omeprazole (PRILOSEC) 20 MG capsule Take 1 capsule (20 mg total) by mouth daily. 02/01/16   Charlesetta Shanks, MD  predniSONE (DELTASONE) 20 MG tablet Take 2 tablets (40 mg total) by mouth daily. 05/14/16   Blanchie Dessert, MD  thalidomide (THALOMID) 50 MG capsule Take 25 mg by mouth at bedtime. Take with water.    Historical Provider, MD  tiZANidine (ZANAFLEX) 4 MG capsule Take 4 mg by mouth 3 (three) times daily.    Historical Provider, MD    Family History Family History  Problem Relation Age of Onset  . Hypertension Brother   . Diabetes Brother     Social History Social History  Substance Use Topics  . Smoking status: Never Smoker  . Smokeless tobacco: Never Used  . Alcohol use No     Allergies   Lisinopril   Review of Systems Review of Systems  Constitutional: Negative for fever.  Respiratory: Positive for cough and shortness of breath.   Cardiovascular: Positive for leg swelling.  All other systems reviewed and are negative.    Physical Exam Updated Vital Signs BP (!) 157/86 (BP Location: Right Arm)   Pulse (!) 101   Temp 98.2 F (36.8 C) (Oral)   Resp (!) 22   Ht 5\' 6"  (1.676 m)   Wt (!) 320 lb (145.2 kg)   LMP 02/16/2012   SpO2 99%   BMI 51.65 kg/m   Physical Exam  Constitutional: She is oriented to person, place, and time. She appears well-developed and  well-nourished. No distress.  Speaking in broken sentences.   HENT:  Head: Normocephalic and atraumatic.  Cardiovascular: Normal rate, regular rhythm and normal heart sounds.   No murmur heard. Pulmonary/Chest: Tachypnea noted. No respiratory distress.  Increased effort of breathing. 99% O2 on RA.  Lungs clear to auscultation with no wheezes or crackles.   Abdominal: Soft. She exhibits no distension. There is no tenderness.  Musculoskeletal: She exhibits edema (1+ bilaterally).  Neurological: She is alert and oriented to person, place, and time.  Skin: Skin is warm and dry.  Nursing note and vitals reviewed.    ED Treatments / Results  DIAGNOSTIC STUDIES: Oxygen Saturation is 99% on RA, normal by my interpretation.  COORDINATION OF CARE: 10:50 PM- Pt advised of plan for treatment and pt agrees.  Labs (all labs ordered are listed, but only abnormal results are displayed) Labs Reviewed  BASIC METABOLIC PANEL - Abnormal; Notable for the following:       Result Value   Potassium 3.2 (*)    Glucose, Bld 169 (*)    All other components within normal limits  CBC WITH DIFFERENTIAL/PLATELET - Abnormal; Notable for the following:    Hemoglobin 11.7 (*)    All other components within normal limits  TROPONIN I  BRAIN NATRIURETIC PEPTIDE    EKG  EKG Interpretation  Date/Time:  Thursday June 09 2016 23:02:38 EDT Ventricular Rate:  96 PR Interval:    QRS Duration: 86 QT Interval:  399 QTC Calculation: 505 R Axis:   5 Text Interpretation:  Sinus rhythm Low voltage, precordial leads Abnormal R-wave progression, early transition Borderline T abnormalities, anterior leads Borderline prolonged QT interval No significant change since last tracing Confirmed by POLLINA  MD, CHRISTOPHER [phone]) on 06/09/2016 11:12:42 PM       Radiology Dg Chest 2 View  Result Date: 06/09/2016 CLINICAL DATA:  Chronic shortness of breath and cough. Initial encounter. EXAM: CHEST  2 VIEW COMPARISON:   Chest radiograph performed 05/14/2016 FINDINGS: The lungs are well-aerated and clear. There is no evidence of focal opacification, pleural effusion or pneumothorax. The heart is mildly enlarged. No acute osseous abnormalities are seen. IMPRESSION: Mild cardiomegaly.  Lungs remain grossly clear. Electronically Signed   By: Garald Balding M.D.   On: 06/09/2016 23:15   Ct Angio Chest Pe W Or Wo Contrast  Result Date: 06/10/2016 CLINICAL DATA:  Shortness of breath. EXAM: CT ANGIOGRAPHY CHEST WITH CONTRAST TECHNIQUE: Multidetector CT imaging of the chest was performed using the standard protocol during bolus administration of intravenous contrast. Multiplanar CT image reconstructions and MIPs were obtained to evaluate the vascular anatomy. CONTRAST:  100 cc Isovue 370 IV COMPARISON:  Chest radiographs earlier this day. Report from chest CTA 01/02/2015, images not available. FINDINGS: Cardiovascular: There are no filling defects within the pulmonary arteries to the proximal subsegmental level to suggest pulmonary embolus. Distal most branches cannot be assessed due to contrast bolus timing and soft tissue attenuation secondary to body habitus. Thoracic aorta is normal in caliber. Common origin of the brachiocephalic left common carotid artery, normal variant. Mild cardiomegaly. Suspect minimal coronary artery calcifications. Mediastinum/Nodes: 9 mm right upper paratracheal node. Small lower paratracheal and prevascular nodes. No hilar adenopathy. No axillary adenopathy. Thyroid isthmus appears prominent, no well-defined nodule. The esophagus is decompressed. Lungs/Pleura: No consolidation, pulmonary edema or pleural fluid. Subsegmental linear atelectasis in the lingula. No definite bronchial thickening or air trapping. Upper Abdomen: No acute abnormality. Musculoskeletal: There are no acute or suspicious osseous abnormalities. Degenerative change in the spine. Review of the MIP images confirms the above findings.  IMPRESSION: No pulmonary embolus or acute abnormality. Cardiomegaly and coronary artery calcifications. Electronically Signed   By: Jeb Levering M.D.   On: 06/10/2016 00:48    Procedures Procedures (including critical care time)  Medications Ordered in ED Medications  iopamidol (ISOVUE-370) 76 % injection 100 mL (100 mLs Intravenous Contrast Given 06/10/16 0001)  albuterol (PROVENTIL) (2.5 MG/3ML) 0.083% nebulizer solution 2.5 mg (2.5 mg Nebulization Given 06/10/16 0038)     Initial Impression / Assessment and Plan / ED Course  I have reviewed the triage vital signs and the nursing notes.  Pertinent labs & imaging results that were available during my  care of the patient were reviewed by me and considered in my medical decision making (see chart for details).    Tonya Quinn is a 51 y.o. female who presents to ED for worsening shortness of breath for the last month.She did get duoneb and 125 mg solumedrol by EMS and notes some relief with this. Seen in ED for similar on both 3/03 and 3/22 and chart reviewed from these encounters. Started on zithromax on 3/22 which she took to completion with no improvement. Also has been using home symbicort, albuterol inhaler and albuterol nebulizer. On exam today, patient is tachypneic and speaking in broken sentences. Her lungs are clear to auscultation bilaterally. Afebrile. She does have some mild lower extremity swelling and states shortness of breath is worse when lying flat, therefore BNP was obtained and wdl. Doubt shortness of breath is due to volume overload. Blood work reviewed and reassuring. Troponin negative. Doubt ACS. CXR with mild cardiomegaly but otherwise unremarkable. Given duration of worsening shortness of breath despite prior outpatient treatment, CT of the chest was obtained for further evaluation which is pending at shift change. Care assumed by attending, Dr. Betsey Holiday who will follow up on imaging and dispo appropriately.      Final  Clinical Impressions(s) / ED Diagnoses   Final diagnoses:  Shortness of breath    New Prescriptions New Prescriptions   No medications on file   I personally performed the services described in this documentation, which was scribed in my presence. The recorded information has been reviewed and is accurate.     [facility] Torin Modica, PA-C 06/10/16 [id number]    Orpah Greek, MD 06/10/16 [phone]

## 2016-06-09 NOTE — ED Notes (Signed)
ED Provider at bedside. 

## 2016-06-09 NOTE — ED Notes (Signed)
Patient transported to CT 

## 2016-06-09 NOTE — ED Triage Notes (Addendum)
"  Asthma attack" 4pm - 2 albuterol neb tx at home. Did not help for long.  Called EMS. EMS gave Albuterol 10mg , 0.5 Atrovent, Solumedrol 125mg .  Pt reports that the nebs only last a few minutes.  Pt finished Z-Pak 3 days ago for Bronchitis. Non-productive cough.

## 2016-06-10 MED ORDER — ALBUTEROL SULFATE (2.5 MG/3ML) 0.083% IN NEBU
2.5000 mg | INHALATION_SOLUTION | Freq: Once | RESPIRATORY_TRACT | Status: AC
  Administered 2016-06-10: 2.5 mg via RESPIRATORY_TRACT
  Filled 2016-06-10: qty 3

## 2016-06-10 MED ORDER — PREDNISONE 20 MG PO TABS: ORAL_TABLET | ORAL | 0 refills | Status: DC

## 2016-06-10 NOTE — ED Notes (Signed)
ED Provider at bedside. 

## 2016-10-08 ENCOUNTER — Encounter (HOSPITAL_BASED_OUTPATIENT_CLINIC_OR_DEPARTMENT_OTHER): Payer: Self-pay | Admitting: Emergency Medicine

## 2016-10-08 ENCOUNTER — Emergency Department (HOSPITAL_BASED_OUTPATIENT_CLINIC_OR_DEPARTMENT_OTHER)
Admission: EM | Admit: 2016-10-08 | Discharge: 2016-10-09 | Disposition: A | Discharge status: Home / Self Care | Payer: Medicaid Other | Attending: Emergency Medicine | Admitting: Emergency Medicine

## 2016-10-08 DIAGNOSIS — Z794 Long term (current) use of insulin: Secondary | ICD-10-CM | POA: Insufficient documentation

## 2016-10-08 DIAGNOSIS — E1165 Type 2 diabetes mellitus with hyperglycemia: Secondary | ICD-10-CM | POA: Insufficient documentation

## 2016-10-08 DIAGNOSIS — Z79899 Other long term (current) drug therapy: Secondary | ICD-10-CM | POA: Insufficient documentation

## 2016-10-08 DIAGNOSIS — E039 Hypothyroidism, unspecified: Secondary | ICD-10-CM | POA: Insufficient documentation

## 2016-10-08 DIAGNOSIS — J45909 Unspecified asthma, uncomplicated: Secondary | ICD-10-CM | POA: Insufficient documentation

## 2016-10-08 DIAGNOSIS — R739 Hyperglycemia, unspecified: Secondary | ICD-10-CM

## 2016-10-08 DIAGNOSIS — I10 Essential (primary) hypertension: Secondary | ICD-10-CM | POA: Insufficient documentation

## 2016-10-08 LAB — BASIC METABOLIC PANEL
ANION GAP: 12 (ref 5–15)
BUN: 13 mg/dL (ref 6–20)
CALCIUM: 9.4 mg/dL (ref 8.9–10.3)
CHLORIDE: 103 mmol/L (ref 101–111)
CO2: 21 mmol/L — ABNORMAL LOW (ref 22–32)
CREATININE: 0.85 mg/dL (ref 0.44–1.00)
GFR calc non Af Amer: >60 mL/min (ref >60)
Glucose, Bld: 293 mg/dL — ABNORMAL HIGH (ref 65–99)
Potassium: 3.9 mmol/L (ref 3.5–5.1)
SODIUM: 136 mmol/L (ref 135–145)

## 2016-10-08 LAB — CBG MONITORING, ED
GLUCOSE-CAPILLARY: 248 mg/dL — AB (ref 65–99)
Glucose-Capillary: 290 mg/dL — ABNORMAL HIGH (ref 65–99)

## 2016-10-08 MED ORDER — SODIUM CHLORIDE 0.9 % IV BOLUS (SEPSIS)
1000.0000 mL | Freq: Once | INTRAVENOUS | Status: AC
  Administered 2016-10-08: 1000 mL via INTRAVENOUS

## 2016-10-08 NOTE — ED Notes (Addendum)
Alert, NAD, calm, interactive, resps e/u, speaking in clear complete sentences, no dyspnea noted, skin W&D, VSS, here for high BS, stopped insulin d/t cost, to receive PCP Tuesday, takes metformin 2 BID, diarrhea usual d/t metformin, c/o woozy, light headed, weak and nausea, (denies: pain, vomiting or sob), EDP Dr. Winfred Leeds into room at [facility].

## 2016-10-08 NOTE — ED Triage Notes (Signed)
Pt c/o glucose > 200 all day; denies specific sxs at this time, but sts she does not want to go to sleep because her blood sugar always goes up when she sleeps.

## 2016-10-08 NOTE — ED Provider Notes (Addendum)
[facility] DEPT [facility] Provider Note   CSN: [id number] Arrival date & time: 10/08/16  2003 By signing my name below, I, Dyke Brackett, attest that this documentation has been prepared under the direction and in the presence of Orlie Dakin, MD . Electronically Signed: Dyke Brackett, Scribe. 10/08/2016. 9:24 PM.   History   Chief Complaint Chief Complaint  Patient presents with  . Hyperglycemia   HPI Tonya Quinn is a 51 y.o. female with a history of DM who presents to the Emergency Department complaining of Fatigue onset about onset one week ago. She reports associated fatigue and increased thirst. No alleviating or modifying factors noted. She's had similar symptoms in the past when her blood sugar was high.  She is currently on Metformin and feels as if she needs to be put back insulin. Pt reports her CBG been in the 400's. Pt has not seen her PCP for this, but has an appointment with them next week. Pt states she has eaten a fried chicken biscuit and french fries today. She denies any illicit drug, alcohol, or tobacco use. Pt has no other acute complaints or associated symptoms at this time.  No fever. Denies pain anywhere. No other associated symptoms.  The history is provided by the patient. No language interpreter was used.    Past Medical History:  Diagnosis Date  . Asthma   . Atrial fibrillation ([place])   . DDD (degenerative disc disease), lumbar   . Diabetes mellitus   . HLD (hyperlipidemia)   . Hypertension   . Hypothyroidism   . Obesity   . Sleep apnea     Patient Active Problem List   Diagnosis Date Noted  . Weakness 04/08/2015  . Diabetes mellitus without complication ([place]) 76/73/4193  . Anxiety 04/07/2015  . Left-sided weakness 04/07/2015  . Hypertension   . Asthma   . DDD (degenerative disc disease), lumbar   . Obesity   . HLD (hyperlipidemia)   . Hypothyroidism   . Essential hypertension   . Atrial fibrillation ([place])   . Fibroids 03/01/2012  .  ALLERGIC RHINITIS 07/05/2006  . LOW BACK PAIN, CHRONIC 07/05/2006  . BACK PAIN 07/05/2006  . DYSURIA 07/05/2006    Past Surgical History:  Procedure Laterality Date  . ABDOMINAL HYSTERECTOMY    . CYSTOSCOPY  02/29/2012   Procedure: CYSTOSCOPY;  Surgeon: Cheri Fowler, MD;  Location: [facility];  Service: Gynecology;  Laterality: N/A;  . LAPAROSCOPIC ASSISTED VAGINAL HYSTERECTOMY  02/29/2012   Procedure: LAPAROSCOPIC ASSISTED VAGINAL HYSTERECTOMY;  Surgeon: Cheri Fowler, MD;  Location: [facility];  Service: Gynecology;  Laterality: N/A;  . SALPINGOOPHORECTOMY  02/29/2012   Procedure: SALPINGO OOPHORECTOMY;  Surgeon: Cheri Fowler, MD;  Location: [facility];  Service: Gynecology;  Laterality: Bilateral;  . uterine ablation    . VEIN SURGERY      OB History    No data available       Home Medications    Prior to Admission medications   Medication Sig Start Date End Date Taking? Authorizing Provider  albuterol (PROVENTIL HFA;VENTOLIN HFA) 108 (90 Base) MCG/ACT inhaler Inhale into the lungs.    [provider]  ALPRAZolam Duanne Moron) 0.25 MG tablet Take 0.25 mg by mouth at bedtime as needed. For anxiety    [provider]  amLODipine (NORVASC) 10 MG tablet Take 10 mg by mouth daily.    [provider]  atenolol (TENORMIN) 50 MG tablet Take 25 mg by mouth daily.    [provider]  azithromycin (  ZITHROMAX Z-PAK) 250 MG tablet 2 po day one, then 1 daily x 4 days 06/02/16   Charlesetta Shanks, MD  benzonatate (TESSALON) 100 MG capsule Take 1 capsule (100 mg total) by mouth every 8 (eight) hours. 06/02/16   Charlesetta Shanks, MD  Cetirizine HCl (ZYRTEC ALLERGY) 10 MG CAPS Take 1 capsule (10 mg total) by mouth daily. 05/14/16   Blanchie Dessert, MD  Cholecalciferol (VITAMIN D PO) Take 1 tablet by mouth daily.    [provider]  diclofenac sodium (VOLTAREN) 1 % GEL Apply 4 g topically 4 (four) times daily. 07/15/15   Palumbo, April, MD  FLUoxetine HCl (PROZAC PO)  Take by mouth.    [provider]  HYDROcodone-homatropine (HYCODAN) 5-1.5 MG/5ML syrup Take 5 mLs by mouth every 6 (six) hours as needed for cough. 06/02/16   Charlesetta Shanks, MD  Insulin Lispro (HUMALOG [place]) Inject into the skin.    [provider]  losartan (COZAAR) 100 MG tablet Take 100 mg by mouth daily.    [provider]  omeprazole (PRILOSEC) 20 MG capsule Take 1 capsule (20 mg total) by mouth daily. 02/01/16   Charlesetta Shanks, MD  predniSONE (DELTASONE) 20 MG tablet 3 tabs po daily x 3 days, then 2 tabs x 3 days, then 1.5 tabs x 3 days, then 1 tab x 3 days, then 0.5 tabs x 3 days 06/10/16   Orpah Greek, MD  thalidomide (THALOMID) 50 MG capsule Take 25 mg by mouth at bedtime. Take with water.    [provider]  tiZANidine (ZANAFLEX) 4 MG capsule Take 4 mg by mouth 3 (three) times daily.    [provider]    Family History Family History  Problem Relation Age of Onset  . Hypertension Brother   . Diabetes Brother     Social History Social History  Substance Use Topics  . Smoking status: Never Smoker  . Smokeless tobacco: Never Used  . Alcohol use No     Allergies   Lisinopril and Sertraline   Review of Systems Review of Systems  Constitutional: Positive for fatigue.  HENT: Negative.   Respiratory: Negative.   Cardiovascular: Negative.   Gastrointestinal: Negative.   Musculoskeletal: Negative.   Skin: Negative.   Allergic/Immunologic: Positive for immunocompromised state.       Diabetic  Neurological: Negative.   Psychiatric/Behavioral: Negative.   All other systems reviewed and are negative.    Physical Exam Updated Vital Signs BP 137/81   Pulse 94   Temp 98.1 F (36.7 C) (Oral)   Resp 20   Ht 5\' 6"  (1.676 m)   Wt (!) 315 lb (142.9 kg)   LMP 02/16/2012   SpO2 100%   BMI 50.84 kg/m   Physical Exam  Constitutional: She appears well-developed and well-nourished. No distress.  HENT:  Head:  Normocephalic and atraumatic.  Eyes: Pupils are equal, round, and reactive to light. Conjunctivae are normal.  Neck: Neck supple. No tracheal deviation present. No thyromegaly present.  Cardiovascular: Normal rate, regular rhythm and normal heart sounds.   No murmur heard. Pulmonary/Chest: Effort normal and breath sounds normal.  Abdominal: Soft. Bowel sounds are normal. She exhibits no distension. There is no tenderness.  Morbidly obese  Musculoskeletal: Normal range of motion. She exhibits no edema or tenderness.  Neurological: She is alert. Coordination normal.  Skin: Skin is warm and dry. Capillary refill takes less than 2 seconds. No rash noted.  Psychiatric: She has a normal mood and affect.  Nursing  note and vitals reviewed.    ED Treatments / Results  DIAGNOSTIC STUDIES:  Oxygen Saturation is 100% on RA, normal by my interpretation.    COORDINATION OF CARE:  9:22 PM Discussed treatment plan with pt at bedside and pt agreed to plan.   Labs (all labs ordered are listed, but only abnormal results are displayed) Labs Reviewed  CBG MONITORING, ED - Abnormal; Notable for the following:       Result Value   Glucose-Capillary 290 (*)    All other components within normal limits    EKG  EKG Interpretation None       Radiology No results found.  Procedures Procedures (including critical care time)  Medications Ordered in ED Medications - No data to display   Initial Impression / Assessment and Plan / ED Course  I have reviewed the triage vital signs and the nursing notes.  Pertinent labs & imaging results that were available during my care of the patient were reviewed by me and considered in my medical decision making (see chart for details).     Results for orders placed or performed during the hospital encounter of 76/16/07  Basic metabolic panel  Result Value Ref Range   Sodium 136 135 - 145 mmol/L   Potassium 3.9 3.5 - 5.1 mmol/L   Chloride 103 101 -  111 mmol/L   CO2 21 (L) 22 - 32 mmol/L   Glucose, Bld 293 (H) 65 - 99 mg/dL   BUN 13 6 - 20 mg/dL   Creatinine, Ser 0.85 0.44 - 1.00 mg/dL   Calcium 9.4 8.9 - 10.3 mg/dL   GFR calc non Af Amer >60 >60 mL/min   GFR calc Af Amer >60 >60 mL/min   Anion gap 12 5 - 15  CBG monitoring, ED  Result Value Ref Range   Glucose-Capillary 290 (H) 65 - 99 mg/dL  CBG monitoring, ED  Result Value Ref Range   Glucose-Capillary 248 (H) 65 - 99 mg/dL   No results found.  10:45 PM feels improved after treatment with intravenous hydration. Blood sugar has improved with intravenous hydration. Plan keep scheduled point with PMD on Tuesday, 10/11/2016. Encourage oral hydration Final Clinical Impressions(s) / ED Diagnoses  Diagnosis hyperglycemia Final diagnoses:  None    New Prescriptions New Prescriptions   No medications on file    I personally performed the services described in this documentation, which was scribed in my presence. The recorded information has been reviewed and considered.    Orlie Dakin, MD 10/08/16 [place], [place], MD 10/08/16 2312

## 2016-10-08 NOTE — Discharge Instructions (Signed)
Keep your scheduled appointment with your primary care physician on Tuesday, 10/11/2016.Make sure that you drink at least six 8 ounce glasses of water or Gatorade each day in order to stay well-hydrated.

## 2016-10-19 ENCOUNTER — Ambulatory Visit: Payer: Medicaid Other | Admitting: Family

## 2017-07-18 ENCOUNTER — Other Ambulatory Visit: Payer: Self-pay

## 2017-07-18 ENCOUNTER — Encounter (HOSPITAL_BASED_OUTPATIENT_CLINIC_OR_DEPARTMENT_OTHER): Payer: Self-pay | Admitting: *Deleted

## 2017-07-18 ENCOUNTER — Emergency Department (HOSPITAL_BASED_OUTPATIENT_CLINIC_OR_DEPARTMENT_OTHER)
Admission: EM | Admit: 2017-07-18 | Discharge: 2017-07-18 | Disposition: A | Discharge status: Home / Self Care | Payer: Self-pay | Attending: Emergency Medicine | Admitting: Emergency Medicine

## 2017-07-18 DIAGNOSIS — E039 Hypothyroidism, unspecified: Secondary | ICD-10-CM | POA: Insufficient documentation

## 2017-07-18 DIAGNOSIS — I10 Essential (primary) hypertension: Secondary | ICD-10-CM | POA: Insufficient documentation

## 2017-07-18 DIAGNOSIS — E119 Type 2 diabetes mellitus without complications: Secondary | ICD-10-CM | POA: Insufficient documentation

## 2017-07-18 DIAGNOSIS — R1010 Upper abdominal pain, unspecified: Secondary | ICD-10-CM

## 2017-07-18 DIAGNOSIS — R1011 Right upper quadrant pain: Secondary | ICD-10-CM | POA: Insufficient documentation

## 2017-07-18 DIAGNOSIS — Z794 Long term (current) use of insulin: Secondary | ICD-10-CM | POA: Insufficient documentation

## 2017-07-18 DIAGNOSIS — J45909 Unspecified asthma, uncomplicated: Secondary | ICD-10-CM | POA: Insufficient documentation

## 2017-07-18 DIAGNOSIS — R110 Nausea: Secondary | ICD-10-CM | POA: Insufficient documentation

## 2017-07-18 DIAGNOSIS — R300 Dysuria: Secondary | ICD-10-CM | POA: Insufficient documentation

## 2017-07-18 DIAGNOSIS — Z79899 Other long term (current) drug therapy: Secondary | ICD-10-CM | POA: Insufficient documentation

## 2017-07-18 LAB — CBC
HEMATOCRIT: 38.5 % (ref 36.0–46.0)
Hemoglobin: 12.7 g/dL (ref 12.0–15.0)
MCH: 28.3 pg (ref 26.0–34.0)
MCHC: 33 g/dL (ref 30.0–36.0)
MCV: 85.9 fL (ref 78.0–100.0)
Platelets: 167 10*3/uL (ref 150–400)
RBC: 4.48 MIL/uL (ref 3.87–5.11)
RDW: 13.7 % (ref 11.5–15.5)
WBC: 6.5 10*3/uL (ref 4.0–10.5)

## 2017-07-18 LAB — COMPREHENSIVE METABOLIC PANEL
ALT: 23 U/L (ref 14–54)
AST: 20 U/L (ref 15–41)
Albumin: 3.9 g/dL (ref 3.5–5.0)
Alkaline Phosphatase: 108 U/L (ref 38–126)
Anion gap: 11 (ref 5–15)
BUN: 14 mg/dL (ref 6–20)
CHLORIDE: 98 mmol/L — AB (ref 101–111)
CO2: 25 mmol/L (ref 22–32)
Calcium: 9.4 mg/dL (ref 8.9–10.3)
Creatinine, Ser: 0.68 mg/dL (ref 0.44–1.00)
GFR calc Af Amer: >60 mL/min (ref >60)
Glucose, Bld: 280 mg/dL — ABNORMAL HIGH (ref 65–99)
POTASSIUM: 3.7 mmol/L (ref 3.5–5.1)
SODIUM: 134 mmol/L — AB (ref 135–145)
Total Bilirubin: 0.5 mg/dL (ref 0.3–1.2)
Total Protein: 7.9 g/dL (ref 6.5–8.1)

## 2017-07-18 LAB — URINALYSIS, ROUTINE W REFLEX MICROSCOPIC
Bilirubin Urine: NEGATIVE
Glucose, UA: >=500 mg/dL — AB
Hgb urine dipstick: NEGATIVE
Ketones, ur: NEGATIVE mg/dL
Leukocytes, UA: NEGATIVE
Nitrite: NEGATIVE
PH: 6 (ref 5.0–8.0)
Protein, ur: NEGATIVE mg/dL
SPECIFIC GRAVITY, URINE: 1.02 (ref 1.005–1.030)

## 2017-07-18 LAB — URINALYSIS, MICROSCOPIC (REFLEX)

## 2017-07-18 LAB — LIPASE, BLOOD: LIPASE: 20 U/L (ref 11–51)

## 2017-07-18 MED ORDER — RANITIDINE HCL 150 MG PO CAPS: 150.0000 mg | ORAL_CAPSULE | Freq: Every day | ORAL | 0 refills | Status: DC

## 2017-07-18 MED ORDER — GI COCKTAIL ~~LOC~~
30.0000 mL | Freq: Once | ORAL | Status: AC
  Administered 2017-07-18: 30 mL via ORAL
  Filled 2017-07-18: qty 30

## 2017-07-18 NOTE — ED Triage Notes (Signed)
Abdominal pain. For a month every am she has had pain in her right upper quadrant radiating into her flank. Nausea. Sometimes food helps the pain but not always.

## 2017-07-18 NOTE — ED Notes (Signed)
NAD at this time. Pt is stable and going home.  

## 2017-07-18 NOTE — ED Provider Notes (Signed)
[facility] EMERGENCY DEPARTMENT Provider Note  CSN: [id number] Arrival date & time: 07/18/17 1251  Chief Complaint(s) Abdominal Pain  HPI Tonya Quinn is a 52 y.o. female   The history is provided by the patient.  Abdominal Pain   This is a recurrent problem. Episode onset: more than 1 yr. The problem occurs daily. The problem has been gradually worsening. Associated with: usually notices it in the am. The pain is located in the RUQ. The quality of the pain is sharp. The pain is moderate. Associated symptoms include nausea and dysuria (for 1 month). Pertinent negatives include fever, diarrhea, hematochezia, melena and vomiting. The symptoms are aggravated by certain positions (lying on right side). The symptoms are relieved by certain positions and eating (lying on left side).   Reports eating hot sauce and drinking vinegar on a daily basis.  Patient also uses Tylenol and ibuprofen for pain.   Past Medical History Past Medical History:  Diagnosis Date  . Asthma   . Atrial fibrillation ([place])   . DDD (degenerative disc disease), lumbar   . Diabetes mellitus   . HLD (hyperlipidemia)   . Hypertension   . Hypothyroidism   . Obesity   . Sleep apnea    Patient Active Problem List   Diagnosis Date Noted  . Weakness 04/08/2015  . Diabetes mellitus without complication ([place]) 35/57/3220  . Anxiety 04/07/2015  . Left-sided weakness 04/07/2015  . Hypertension   . Asthma   . DDD (degenerative disc disease), lumbar   . Obesity   . HLD (hyperlipidemia)   . Hypothyroidism   . Essential hypertension   . Atrial fibrillation ([place])   . Fibroids 03/01/2012  . ALLERGIC RHINITIS 07/05/2006  . LOW BACK PAIN, CHRONIC 07/05/2006  . BACK PAIN 07/05/2006  . DYSURIA 07/05/2006   Home Medication(s) Prior to Admission medications   Medication Sig Start Date End Date Taking? Authorizing Provider  glipiZIDE (GLUCOTROL) 5 MG tablet Take 5 mg by mouth daily before breakfast.   Yes  [provider]  loratadine (CLARITIN) 10 MG tablet Take 10 mg by mouth daily as needed for allergies.   Yes [provider]  losartan (COZAAR) 100 MG tablet Take 100 mg by mouth daily.   Yes [provider]  metFORMIN (GLUCOPHAGE) 1000 MG tablet Take 1,000 mg by mouth daily with breakfast.   Yes [provider]  albuterol (PROVENTIL HFA;VENTOLIN HFA) 108 (90 Base) MCG/ACT inhaler Inhale into the lungs.    [provider]  ALPRAZolam Duanne Moron) 0.25 MG tablet Take 0.25 mg by mouth at bedtime as needed. For anxiety    [provider]  amLODipine (NORVASC) 10 MG tablet Take 10 mg by mouth daily.    [provider]  atenolol (TENORMIN) 50 MG tablet Take 25 mg by mouth daily.    [provider]  azithromycin (ZITHROMAX Z-PAK) 250 MG tablet 2 po day one, then 1 daily x 4 days 06/02/16   Charlesetta Shanks, MD  benzonatate (TESSALON) 100 MG capsule Take 1 capsule (100 mg total) by mouth every 8 (eight) hours. 06/02/16   Charlesetta Shanks, MD  Cetirizine HCl (ZYRTEC ALLERGY) 10 MG CAPS Take 1 capsule (10 mg total) by mouth daily. 05/14/16   Blanchie Dessert, MD  Cholecalciferol (VITAMIN D PO) Take 1 tablet by mouth daily.    [provider]  diclofenac sodium (VOLTAREN) 1 % GEL Apply 4 g topically 4 (four) times daily. 07/15/15   Palumbo, April, MD  FLUoxetine HCl (PROZAC  PO) Take by mouth.    [provider]  HYDROcodone-homatropine (HYCODAN) 5-1.5 MG/5ML syrup Take 5 mLs by mouth every 6 (six) hours as needed for cough. 06/02/16   Charlesetta Shanks, MD  Insulin Lispro (HUMALOG [place]) Inject into the skin.    [provider]  omeprazole (PRILOSEC) 20 MG capsule Take 1 capsule (20 mg total) by mouth daily. 02/01/16   Charlesetta Shanks, MD  predniSONE (DELTASONE) 20 MG tablet 3 tabs po daily x 3 days, then 2 tabs x 3 days, then 1.5 tabs x 3 days, then 1 tab x 3 days, then 0.5 tabs x 3 days 06/10/16   Orpah Greek, MD    ranitidine (ZANTAC) 150 MG capsule Take 1 capsule (150 mg total) by mouth daily. 07/18/17   Fatima Blank, MD  thalidomide (THALOMID) 50 MG capsule Take 25 mg by mouth at bedtime. Take with water.    [provider]  tiZANidine (ZANAFLEX) 4 MG capsule Take 4 mg by mouth 3 (three) times daily.    [provider]                                                                                                                                    Past Surgical History Past Surgical History:  Procedure Laterality Date  . ABDOMINAL HYSTERECTOMY    . CYSTOSCOPY  02/29/2012   Procedure: CYSTOSCOPY;  Surgeon: Cheri Fowler, MD;  Location: [facility];  Service: Gynecology;  Laterality: N/A;  . LAPAROSCOPIC ASSISTED VAGINAL HYSTERECTOMY  02/29/2012   Procedure: LAPAROSCOPIC ASSISTED VAGINAL HYSTERECTOMY;  Surgeon: Cheri Fowler, MD;  Location: [facility];  Service: Gynecology;  Laterality: N/A;  . SALPINGOOPHORECTOMY  02/29/2012   Procedure: SALPINGO OOPHORECTOMY;  Surgeon: Cheri Fowler, MD;  Location: [facility];  Service: Gynecology;  Laterality: Bilateral;  . uterine ablation    . VEIN SURGERY     Family History Family History  Problem Relation Age of Onset  . Hypertension Brother   . Diabetes Brother     Social History Social History   Tobacco Use  . Smoking status: Never Smoker  . Smokeless tobacco: Never Used  Substance Use Topics  . Alcohol use: No  . Drug use: No   Allergies Lisinopril and Sertraline  Review of Systems Review of Systems  Constitutional: Negative for fever.  Gastrointestinal: Positive for abdominal pain and nausea. Negative for diarrhea, hematochezia, melena and vomiting.  Genitourinary: Positive for dysuria (for 1 month).   All other systems are reviewed and are negative for acute change except as noted in the HPI  Physical Exam Vital Signs  I have reviewed the triage vital signs BP (!) 142/88 (BP Location: Right Arm)   Pulse 77   Temp  98.1 F (36.7 C) (Oral)   Resp 19   Ht 5\' 6"  (1.676 m)   Wt 136.1 kg (300 lb)   LMP 02/16/2012   SpO2 98%  BMI 48.42 kg/m   Physical Exam  Constitutional: She is oriented to person, place, and time. She appears well-developed and well-nourished. No distress.  HENT:  Head: Normocephalic and atraumatic.  Right Ear: External ear normal.  Left Ear: External ear normal.  Nose: Nose normal.  Eyes: Conjunctivae and EOM are normal. No scleral icterus.  Neck: Normal range of motion and phonation normal.  Cardiovascular: Normal rate and regular rhythm.  Pulmonary/Chest: Effort normal. No stridor. No respiratory distress.  Abdominal: She exhibits no distension. There is tenderness in the right upper quadrant and epigastric area. There is no rigidity, no rebound, no guarding and no CVA tenderness.  Musculoskeletal: Normal range of motion. She exhibits no edema.  Neurological: She is alert and oriented to person, place, and time.  Skin: She is not diaphoretic.  Psychiatric: She has a normal mood and affect. Her behavior is normal.  Vitals reviewed.   ED Results and Treatments Labs (all labs ordered are listed, but only abnormal results are displayed) Labs Reviewed  COMPREHENSIVE METABOLIC PANEL - Abnormal; Notable for the following components:      Result Value   Sodium 134 (*)    Chloride 98 (*)    Glucose, Bld 280 (*)    All other components within normal limits  URINALYSIS, ROUTINE W REFLEX MICROSCOPIC - Abnormal; Notable for the following components:   APPearance HAZY (*)    Glucose, UA >=500 (*)    All other components within normal limits  URINALYSIS, MICROSCOPIC (REFLEX) - Abnormal; Notable for the following components:   Bacteria, UA MANY (*)    All other components within normal limits  LIPASE, BLOOD  CBC                                                                                                                         EKG  EKG Interpretation  Date/Time:      Ventricular Rate:    PR Interval:    QRS Duration:   QT Interval:    QTC Calculation:   R Axis:     Text Interpretation:        Radiology No results found. Pertinent labs & imaging results that were available during my care of the patient were reviewed by me and considered in my medical decision making (see chart for details).  Medications Ordered in ED Medications  gi cocktail (Maalox,Lidocaine,Donnatal) (30 mLs Oral Given 07/18/17 1623)  Procedures Procedures  EMERGENCY DEPARTMENT BILIARY ULTRASOUND INTERPRETATION "Study: Limited Abdominal Ultrasound of the Gallbladder and Common Bile Duct."  INDICATIONS: Abdominal pain Indication: Multiple views of the gallbladder and common bile duct were obtained in real-time with a Multi-frequency probe."  PERFORMED BY:  Myself IMAGES ARCHIVED?: Yes LIMITATIONS: Body habitus INTERPRETATION: Normal, Gallbladder wall normal in thickness and Common bile duct normal in size   (including critical care time)  Medical Decision Making / ED Course I have reviewed the nursing notes for this encounter and the patient's prior records (if available in EHR or on provided paperwork).    Recurrent chronic right upper quadrant abdominal discomfort.  Abdomen with mild right upper quadrant/right flank tenderness to palpation without evidence of peritonitis.  Bedside ultrasound without evidence of acute cholecystitis.  Screening labs without leukocytosis, anemia, significant electrolyte derangements, renal insufficiency, biliary obstruction or evidence of pancreatitis.  Did reveal hyperglycemia without evidence of DKA.  UA without evidence of infection.  Low suspicion for serious intra-abdominal inflammatory/infectious process requiring advanced imaging.  Possible gastritis versus PUD versus gastroparesis.  No melena suggesting  active bleeding.  Recommended antacids and close PCP and/or GI follow-up.  The patient appears reasonably screened and/or stabilized for discharge and I doubt any other medical condition or other [facility] requiring further screening, evaluation, or treatment in the ED at this time prior to discharge.  The patient is safe for discharge with strict return precautions.   Final Clinical Impression(s) / ED Diagnoses Final diagnoses:  Pain of upper abdomen    Disposition: Discharge  Condition: Good  I have discussed the results, Dx and Tx plan with the patient who expressed understanding and agree(s) with the plan. Discharge instructions discussed at great length. The patient was given strict return precautions who verbalized understanding of the instructions. No further questions at time of discharge.    ED Discharge Orders        Ordered    ranitidine (ZANTAC) 150 MG capsule  Daily     07/18/17 1800       Follow Up: Marry Guan [address] [place] [address] [phone]  Schedule an appointment as soon as possible for a visit  in 5-7 days, If symptoms do not improve or  worsen  Gastroenterology, [address] [place] [address] [phone]  Schedule an appointment as soon as possible for a visit  As needed     This chart was dictated using voice recognition software.  Despite best efforts to proofread,  errors can occur which can change the documentation meaning.   Fatima Blank, MD 07/18/17 [phone]

## 2017-08-06 ENCOUNTER — Other Ambulatory Visit: Payer: Self-pay

## 2017-08-06 ENCOUNTER — Encounter (HOSPITAL_BASED_OUTPATIENT_CLINIC_OR_DEPARTMENT_OTHER): Payer: Self-pay | Admitting: Emergency Medicine

## 2017-08-06 ENCOUNTER — Emergency Department (HOSPITAL_BASED_OUTPATIENT_CLINIC_OR_DEPARTMENT_OTHER)
Admission: EM | Admit: 2017-08-06 | Discharge: 2017-08-06 | Disposition: A | Discharge status: Home / Self Care | Payer: Self-pay | Attending: Emergency Medicine | Admitting: Emergency Medicine

## 2017-08-06 DIAGNOSIS — I10 Essential (primary) hypertension: Secondary | ICD-10-CM | POA: Insufficient documentation

## 2017-08-06 DIAGNOSIS — J45998 Other asthma: Secondary | ICD-10-CM | POA: Insufficient documentation

## 2017-08-06 DIAGNOSIS — Z794 Long term (current) use of insulin: Secondary | ICD-10-CM | POA: Insufficient documentation

## 2017-08-06 DIAGNOSIS — L0291 Cutaneous abscess, unspecified: Secondary | ICD-10-CM

## 2017-08-06 DIAGNOSIS — E119 Type 2 diabetes mellitus without complications: Secondary | ICD-10-CM | POA: Insufficient documentation

## 2017-08-06 DIAGNOSIS — Z79899 Other long term (current) drug therapy: Secondary | ICD-10-CM | POA: Insufficient documentation

## 2017-08-06 DIAGNOSIS — E039 Hypothyroidism, unspecified: Secondary | ICD-10-CM | POA: Insufficient documentation

## 2017-08-06 DIAGNOSIS — L02211 Cutaneous abscess of abdominal wall: Secondary | ICD-10-CM | POA: Insufficient documentation

## 2017-08-06 MED ORDER — CEPHALEXIN 500 MG PO CAPS: 500.0000 mg | ORAL_CAPSULE | Freq: Four times a day (QID) | ORAL | 0 refills | Status: DC

## 2017-08-06 MED ORDER — SULFAMETHOXAZOLE-TRIMETHOPRIM 800-160 MG PO TABS: 1.0000 | ORAL_TABLET | Freq: Two times a day (BID) | ORAL | 0 refills | Status: AC

## 2017-08-06 MED ORDER — LIDOCAINE HCL (PF) 1 % IJ SOLN
5.0000 mL | Freq: Once | INTRAMUSCULAR | Status: AC
  Administered 2017-08-06: 5 mL
  Filled 2017-08-06: qty 5

## 2017-08-06 MED ORDER — SULFAMETHOXAZOLE-TRIMETHOPRIM 800-160 MG PO TABS
1.0000 | ORAL_TABLET | Freq: Once | ORAL | Status: AC
  Administered 2017-08-06: 1 via ORAL
  Filled 2017-08-06: qty 1

## 2017-08-06 MED ORDER — ACETAMINOPHEN 500 MG PO TABS
500.0000 mg | ORAL_TABLET | Freq: Once | ORAL | Status: AC
  Administered 2017-08-06: 500 mg via ORAL
  Filled 2017-08-06: qty 1

## 2017-08-06 MED ORDER — ACETAMINOPHEN 500 MG PO TABS: 1000.0000 mg | ORAL_TABLET | Freq: Three times a day (TID) | ORAL | 0 refills | Status: DC | PRN

## 2017-08-06 MED ORDER — CEPHALEXIN 250 MG PO CAPS
500.0000 mg | ORAL_CAPSULE | Freq: Once | ORAL | Status: AC
  Administered 2017-08-06: 500 mg via ORAL
  Filled 2017-08-06: qty 2

## 2017-08-06 NOTE — ED Notes (Signed)
Patient while in triage is pushing on it and pus is coming out of the wound

## 2017-08-06 NOTE — Discharge Instructions (Signed)
Medications: Bactrim, Keflex  Treatment: Take Bactrim and Keflex until completed to  treat your infection.  You can take Tylenol as prescribed, as needed for pain.  Use warm compresses to help continue drainage of your abscess.  Follow-up: Please follow-up with your doctor in 2 days for wound check.  Please return to the emergency department if you develop any new or worsening symptoms including increasing pain, redness, swelling, red streaking away from the wound, or fever.  Please also have your doctor recheck your blood pressure at your visit.

## 2017-08-06 NOTE — ED Notes (Addendum)
Pt states she has a hernia and that she has had this spot in her epigastric region for years, but has never noticed it having any drainage. Today has noticed foul smelling drainage from same. Pt is diabetic. Also c/o right side pain for a couple of years. States she has had this checked out, but they haven't ever found anything.

## 2017-08-06 NOTE — ED Triage Notes (Signed)
Patient states that she stuck and lead pencil in her stomach about 30 years ago. She states that today that area has started to smell "horrendious" and leak

## 2017-08-06 NOTE — ED Notes (Signed)
Pt given d/c instructions as per chart. Rx x 3. Verbalizes understanding. No questions. 

## 2017-08-07 NOTE — ED Provider Notes (Signed)
[facility] EMERGENCY DEPARTMENT Provider Note   CSN: [id number] Arrival date & time: 08/06/17  2032     History   Chief Complaint Chief Complaint  Patient presents with  . Abscess    HPI Tonya Quinn is a 52 y.o. female with history of atrial fibrillation, diabetes, hypertension, hypothyroidism, obesity who presents with abscess to abdomen.  Patient reports she accidentally stabbed a piece of pencil lead in her abdomen 30 years ago.  She reports over the past week she has had drainage from the area and some mild tenderness.  She reports the drainage has had a foul odor.  She has been expressing the drainage.  She denies any fevers, but does report chills.  HPI  Past Medical History:  Diagnosis Date  . Asthma   . Atrial fibrillation ([place])   . DDD (degenerative disc disease), lumbar   . Diabetes mellitus   . HLD (hyperlipidemia)   . Hypertension   . Hypothyroidism   . Obesity   . Sleep apnea     Patient Active Problem List   Diagnosis Date Noted  . Weakness 04/08/2015  . Diabetes mellitus without complication ([place]) 70/03/7492  . Anxiety 04/07/2015  . Left-sided weakness 04/07/2015  . Hypertension   . Asthma   . DDD (degenerative disc disease), lumbar   . Obesity   . HLD (hyperlipidemia)   . Hypothyroidism   . Essential hypertension   . Atrial fibrillation ([place])   . Fibroids 03/01/2012  . ALLERGIC RHINITIS 07/05/2006  . LOW BACK PAIN, CHRONIC 07/05/2006  . BACK PAIN 07/05/2006  . DYSURIA 07/05/2006    Past Surgical History:  Procedure Laterality Date  . ABDOMINAL HYSTERECTOMY    . CYSTOSCOPY  02/29/2012   Procedure: CYSTOSCOPY;  Surgeon: Cheri Fowler, MD;  Location: [facility];  Service: Gynecology;  Laterality: N/A;  . LAPAROSCOPIC ASSISTED VAGINAL HYSTERECTOMY  02/29/2012   Procedure: LAPAROSCOPIC ASSISTED VAGINAL HYSTERECTOMY;  Surgeon: Cheri Fowler, MD;  Location: [facility];  Service: Gynecology;  Laterality: N/A;  . SALPINGOOPHORECTOMY   02/29/2012   Procedure: SALPINGO OOPHORECTOMY;  Surgeon: Cheri Fowler, MD;  Location: [facility];  Service: Gynecology;  Laterality: Bilateral;  . uterine ablation    . VEIN SURGERY       OB History   None      Home Medications    Prior to Admission medications   Medication Sig Start Date End Date Taking? Authorizing Provider  acetaminophen (TYLENOL) 500 MG tablet Take 2 tablets (1,000 mg total) by mouth every 8 (eight) hours as needed. 08/06/17   Kathlyne Loud, Bea Graff, PA-C  albuterol (PROVENTIL HFA;VENTOLIN HFA) 108 (90 Base) MCG/ACT inhaler Inhale into the lungs.    [provider]  ALPRAZolam Duanne Moron) 0.25 MG tablet Take 0.25 mg by mouth at bedtime as needed. For anxiety    [provider]  amLODipine (NORVASC) 10 MG tablet Take 10 mg by mouth daily.    [provider]  atenolol (TENORMIN) 50 MG tablet Take 25 mg by mouth daily.    [provider]  azithromycin (ZITHROMAX Z-PAK) 250 MG tablet 2 po day one, then 1 daily x 4 days 06/02/16   Charlesetta Shanks, MD  benzonatate (TESSALON) 100 MG capsule Take 1 capsule (100 mg total) by mouth every 8 (eight) hours. 06/02/16   Charlesetta Shanks, MD  cephALEXin (KEFLEX) 500 MG capsule Take 1 capsule (500 mg total) by mouth 4 (four) times daily. 08/06/17   Brenson Hartman, Bea Graff, PA-C  Cetirizine HCl (ZYRTEC  ALLERGY) 10 MG CAPS Take 1 capsule (10 mg total) by mouth daily. 05/14/16   Blanchie Dessert, MD  Cholecalciferol (VITAMIN D PO) Take 1 tablet by mouth daily.    [provider]  diclofenac sodium (VOLTAREN) 1 % GEL Apply 4 g topically 4 (four) times daily. 07/15/15   Palumbo, April, MD  FLUoxetine HCl (PROZAC PO) Take by mouth.    [provider]  glipiZIDE (GLUCOTROL) 5 MG tablet Take 5 mg by mouth daily before breakfast.    [provider]  HYDROcodone-homatropine (HYCODAN) 5-1.5 MG/5ML syrup Take 5 mLs by mouth every 6 (six) hours as needed for cough. 06/02/16   Charlesetta Shanks, MD  Insulin Lispro  (HUMALOG [place]) Inject into the skin.    [provider]  loratadine (CLARITIN) 10 MG tablet Take 10 mg by mouth daily as needed for allergies.    [provider]  losartan (COZAAR) 100 MG tablet Take 100 mg by mouth daily.    [provider]  metFORMIN (GLUCOPHAGE) 1000 MG tablet Take 1,000 mg by mouth daily with breakfast.    [provider]  omeprazole (PRILOSEC) 20 MG capsule Take 1 capsule (20 mg total) by mouth daily. 02/01/16   Charlesetta Shanks, MD  predniSONE (DELTASONE) 20 MG tablet 3 tabs po daily x 3 days, then 2 tabs x 3 days, then 1.5 tabs x 3 days, then 1 tab x 3 days, then 0.5 tabs x 3 days 06/10/16   Orpah Greek, MD  ranitidine (ZANTAC) 150 MG capsule Take 1 capsule (150 mg total) by mouth daily. 07/18/17   Fatima Blank, MD  sulfamethoxazole-trimethoprim (BACTRIM DS,SEPTRA DS) 800-160 MG tablet Take 1 tablet by mouth 2 (two) times daily for 7 days. 08/06/17 08/13/17  Frederica Kuster, PA-C  thalidomide (THALOMID) 50 MG capsule Take 25 mg by mouth at bedtime. Take with water.    [provider]  tiZANidine (ZANAFLEX) 4 MG capsule Take 4 mg by mouth 3 (three) times daily.    [provider]    Family History Family History  Problem Relation Age of Onset  . Hypertension Brother   . Diabetes Brother     Social History Social History   Tobacco Use  . Smoking status: Never Smoker  . Smokeless tobacco: Never Used  Substance Use Topics  . Alcohol use: No  . Drug use: No     Allergies   Lisinopril and Sertraline   Review of Systems Review of Systems  Constitutional: Positive for chills. Negative for fever.  Skin: Positive for wound.     Physical Exam Updated Vital Signs BP (!) 170/95 (BP Location: Left Arm)   Pulse 74   Temp 98.2 F (36.8 C) (Oral)   Resp 20   Ht 5\' 6"  (1.676 m)   Wt 129.7 kg (286 lb)   LMP 02/16/2012   SpO2 98%   BMI 46.16 kg/m   Physical Exam  Constitutional: She appears  well-developed and well-nourished. No distress.  HENT:  Head: Normocephalic and atraumatic.  Mouth/Throat: Oropharynx is clear and moist. No oropharyngeal exudate.  Eyes: Pupils are equal, round, and reactive to light. Conjunctivae are normal. Right eye exhibits no discharge. Left eye exhibits no discharge. No scleral icterus.  Neck: Normal range of motion. Neck supple. No thyromegaly present.  Cardiovascular: Normal rate, regular rhythm, normal heart sounds and intact distal pulses. Exam reveals no gallop and no friction rub.  No murmur heard. Pulmonary/Chest: Effort normal and breath sounds normal. No  stridor. No respiratory distress. She has no wheezes. She has no rales.  Abdominal: Soft. Bowel sounds are normal. She exhibits no distension. There is no tenderness. There is no guarding.    Musculoskeletal: She exhibits no edema.  Lymphadenopathy:    She has no cervical adenopathy.  Neurological: She is alert. Coordination normal.  Skin: Skin is warm and dry. No rash noted. She is not diaphoretic. No pallor.  Psychiatric: She has a normal mood and affect.  Nursing note and vitals reviewed.    ED Treatments / Results  Labs (all labs ordered are listed, but only abnormal results are displayed) Labs Reviewed - No data to display  EKG None  Radiology No results found.  Procedures .Marland KitchenIncision and Drainage Date/Time: 08/07/2017 12:21 AM Performed by: Frederica Kuster, PA-C Authorized by: Frederica Kuster, PA-C   Consent:    Consent obtained:  Verbal   Consent given by:  Patient   Risks discussed:  Bleeding, incomplete drainage and infection   Alternatives discussed:  No treatment Location:    Type:  Abscess   Size:  1 cm   Location:  Trunk   Trunk location:  Abdomen Pre-procedure details:    Skin preparation:  Chloraprep Anesthesia (see MAR for exact dosages):    Anesthesia method:  Local infiltration   Local anesthetic:  Lidocaine 1% w/o epi Procedure type:     Complexity:  Simple Procedure details:    Needle aspiration: no     Incision types:  Single straight   Incision depth:  Dermal   Scalpel blade:  11   Wound management:  Probed and deloculated   Drainage:  Bloody and purulent (scant gray solid material)   Drainage amount:  Scant   Wound treatment:  Wound left open   Packing materials:  None Post-procedure details:    Patient tolerance of procedure:  Tolerated well, no immediate complications   EMERGENCY DEPARTMENT US SOFT TISSUE INTERPRETATION "Study: Limited Soft Tissue Ultrasound"  INDICATIONS: Soft tissue infection Multiple views of the body part were obtained in real-time with a multi-frequency linear probe  PERFORMED BY: Myself IMAGES ARCHIVED?: Yes SIDE:Midline BODY PART:Abdominal wall INTERPRETATION:  Abcess present      (including critical care time)  Medications Ordered in ED Medications  lidocaine (PF) (XYLOCAINE) 1 % injection 5 mL (5 mLs Infiltration Given 08/06/17 2324)  sulfamethoxazole-trimethoprim (BACTRIM DS,SEPTRA DS) 800-160 MG per tablet 1 tablet (1 tablet Oral Given 08/06/17 2340)  cephALEXin (KEFLEX) capsule 500 mg (500 mg Oral Given 08/06/17 2340)  acetaminophen (TYLENOL) tablet 500 mg (500 mg Oral Given 08/06/17 2341)     Initial Impression / Assessment and Plan / ED Course  I have reviewed the triage vital signs and the nursing notes.  Pertinent labs & imaging results that were available during my care of the patient were reviewed by me and considered in my medical decision making (see chart for details).     Patient with skin abscess most likely from distant foreign body. Incision and drainage performed in the ED today.  I made a very small incision to continue drainage.  The abscess appeared to be around 1 cm on ultrasound.  Abscess was not large enough to warrant packing or drain placement. Wound recheck in 2 days. Supportive care and return precautions discussed.  Pt sent home with Bactrim and  Keflex.   patient understands and agrees with plan.  Patient vitals stable throughout ED course and discharged in satisfactory condition.   Final Clinical Impressions(s) /  ED Diagnoses   Final diagnoses:  Abscess    ED Discharge Orders        Ordered    sulfamethoxazole-trimethoprim (BACTRIM DS,SEPTRA DS) 800-160 MG tablet  2 times daily     08/06/17 2317    cephALEXin (KEFLEX) 500 MG capsule  4 times daily     08/06/17 2317    acetaminophen (TYLENOL) 500 MG tablet  Every 8 hours PRN     08/06/17 2317       Tyffani Foglesong, Bea Graff, PA-C 08/07/17 0023    Charlesetta Shanks, MD 08/15/17 [phone]

## 2017-09-12 DIAGNOSIS — E1140 Type 2 diabetes mellitus with diabetic neuropathy, unspecified: Secondary | ICD-10-CM

## 2017-09-12 HISTORY — DX: Type 2 diabetes mellitus with diabetic neuropathy, unspecified: E11.40

## 2017-12-28 DIAGNOSIS — N950 Postmenopausal bleeding: Secondary | ICD-10-CM

## 2017-12-28 DIAGNOSIS — N943 Premenstrual tension syndrome: Secondary | ICD-10-CM

## 2017-12-28 HISTORY — DX: Postmenopausal bleeding: N95.0

## 2017-12-28 HISTORY — DX: Premenstrual tension syndrome: N94.3

## 2018-04-07 ENCOUNTER — Other Ambulatory Visit: Payer: Self-pay

## 2018-04-07 ENCOUNTER — Encounter (HOSPITAL_BASED_OUTPATIENT_CLINIC_OR_DEPARTMENT_OTHER): Payer: Self-pay | Admitting: *Deleted

## 2018-04-07 ENCOUNTER — Emergency Department (HOSPITAL_BASED_OUTPATIENT_CLINIC_OR_DEPARTMENT_OTHER)
Admission: EM | Admit: 2018-04-07 | Discharge: 2018-04-07 | Disposition: A | Discharge status: Home / Self Care | Payer: Medicare Other | Attending: Emergency Medicine | Admitting: Emergency Medicine

## 2018-04-07 DIAGNOSIS — G8929 Other chronic pain: Secondary | ICD-10-CM | POA: Insufficient documentation

## 2018-04-07 DIAGNOSIS — E039 Hypothyroidism, unspecified: Secondary | ICD-10-CM | POA: Insufficient documentation

## 2018-04-07 DIAGNOSIS — I10 Essential (primary) hypertension: Secondary | ICD-10-CM | POA: Insufficient documentation

## 2018-04-07 DIAGNOSIS — Z79899 Other long term (current) drug therapy: Secondary | ICD-10-CM | POA: Diagnosis not present

## 2018-04-07 DIAGNOSIS — R3129 Other microscopic hematuria: Secondary | ICD-10-CM | POA: Insufficient documentation

## 2018-04-07 DIAGNOSIS — R1031 Right lower quadrant pain: Secondary | ICD-10-CM | POA: Insufficient documentation

## 2018-04-07 DIAGNOSIS — R109 Unspecified abdominal pain: Secondary | ICD-10-CM

## 2018-04-07 DIAGNOSIS — K644 Residual hemorrhoidal skin tags: Secondary | ICD-10-CM | POA: Insufficient documentation

## 2018-04-07 DIAGNOSIS — E119 Type 2 diabetes mellitus without complications: Secondary | ICD-10-CM | POA: Insufficient documentation

## 2018-04-07 DIAGNOSIS — J45909 Unspecified asthma, uncomplicated: Secondary | ICD-10-CM | POA: Diagnosis not present

## 2018-04-07 DIAGNOSIS — Z794 Long term (current) use of insulin: Secondary | ICD-10-CM | POA: Insufficient documentation

## 2018-04-07 DIAGNOSIS — K439 Ventral hernia without obstruction or gangrene: Secondary | ICD-10-CM | POA: Diagnosis not present

## 2018-04-07 DIAGNOSIS — N938 Other specified abnormal uterine and vaginal bleeding: Secondary | ICD-10-CM | POA: Diagnosis present

## 2018-04-07 LAB — CBC WITH DIFFERENTIAL/PLATELET
Abs Immature Granulocytes: 0.02 10*3/uL (ref 0.00–0.07)
BASOS PCT: 0 %
Basophils Absolute: 0 10*3/uL (ref 0.0–0.1)
Eosinophils Absolute: 0.1 10*3/uL (ref 0.0–0.5)
Eosinophils Relative: 1 %
HCT: 41.1 % (ref 36.0–46.0)
HEMOGLOBIN: 12.6 g/dL (ref 12.0–15.0)
IMMATURE GRANULOCYTES: 0 %
LYMPHS PCT: 32 %
Lymphs Abs: 2.3 10*3/uL (ref 0.7–4.0)
MCH: 26.9 pg (ref 26.0–34.0)
MCHC: 30.7 g/dL (ref 30.0–36.0)
MCV: 87.6 fL (ref 80.0–100.0)
Monocytes Absolute: 0.4 10*3/uL (ref 0.1–1.0)
Monocytes Relative: 6 %
NEUTROS PCT: 61 %
Neutro Abs: 4.3 10*3/uL (ref 1.7–7.7)
PLATELETS: 198 10*3/uL (ref 150–400)
RBC: 4.69 MIL/uL (ref 3.87–5.11)
RDW: 13.7 % (ref 11.5–15.5)
WBC: 7.1 10*3/uL (ref 4.0–10.5)
nRBC: 0 % (ref 0.0–0.2)

## 2018-04-07 LAB — URINALYSIS, MICROSCOPIC (REFLEX)

## 2018-04-07 LAB — COMPREHENSIVE METABOLIC PANEL
ALK PHOS: 109 U/L (ref 38–126)
ALT: 18 U/L (ref 0–44)
ANION GAP: 8 (ref 5–15)
AST: 22 U/L (ref 15–41)
Albumin: 3.7 g/dL (ref 3.5–5.0)
BUN: 11 mg/dL (ref 6–20)
CALCIUM: 8.8 mg/dL — AB (ref 8.9–10.3)
CHLORIDE: 103 mmol/L (ref 98–111)
CO2: 25 mmol/L (ref 22–32)
Creatinine, Ser: 0.74 mg/dL (ref 0.44–1.00)
GFR calc non Af Amer: >60 mL/min (ref >60)
Glucose, Bld: 201 mg/dL — ABNORMAL HIGH (ref 70–99)
POTASSIUM: 3.7 mmol/L (ref 3.5–5.1)
SODIUM: 136 mmol/L (ref 135–145)
Total Bilirubin: 0.2 mg/dL — ABNORMAL LOW (ref 0.3–1.2)
Total Protein: 7.7 g/dL (ref 6.5–8.1)

## 2018-04-07 LAB — URINALYSIS, ROUTINE W REFLEX MICROSCOPIC
BILIRUBIN URINE: NEGATIVE
Glucose, UA: >=500 mg/dL — AB
Ketones, ur: NEGATIVE mg/dL
Leukocytes, UA: NEGATIVE
NITRITE: NEGATIVE
PROTEIN: 30 mg/dL — AB
Specific Gravity, Urine: 1.02 (ref 1.005–1.030)
pH: 6 (ref 5.0–8.0)

## 2018-04-07 LAB — WET PREP, GENITAL
Sperm: NONE SEEN
TRICH WET PREP: NONE SEEN
YEAST WET PREP: NONE SEEN

## 2018-04-07 LAB — LIPASE, BLOOD: Lipase: 20 U/L (ref 11–51)

## 2018-04-07 NOTE — ED Notes (Signed)
ED Provider at bedside. 

## 2018-04-07 NOTE — ED Notes (Addendum)
Pt c/o blood when she wipes. She then began having episodes of diarrhea. She states she is no longer bleeding at this time. Pt denies abdominal pain today, c/o back pain that she states she has had "for years" Denies fever, denies vomiting

## 2018-04-07 NOTE — ED Triage Notes (Signed)
Pt reports vaginal bleeding today (hx of hysterectomy) and flank pain since this am

## 2018-04-07 NOTE — Discharge Instructions (Signed)
If you develop worsening, continued, or recurrent abdominal pain, flank pain, uncontrolled vomiting, fever, chest or back pain, or any other new/concerning symptoms then return to the ER for evaluation.

## 2018-04-07 NOTE — ED Provider Notes (Signed)
[facility] EMERGENCY DEPARTMENT Provider Note   CSN: [id number] Arrival date & time: 04/07/18  1441     History   Chief Complaint Chief Complaint  Patient presents with  . Vaginal Bleeding  . Flank Pain    HPI Tonya Quinn is a 53 y.o. female.  HPI  53 year old female presents with concern of blood when she wipes.  She states that she woke up this morning with right flank pain.  She is been having this pain for about 1 year or so.  It seems to come and go.  It is in the exact same location and there is also some concomitant upper abdominal pain.  No vomiting or fevers.  She started to have diarrhea and had about 2 or 3 loose stools.  After wiping on the first couple there was small amounts of bright red blood.  She has not noticed any blood in her urine or blood in the stool.  She thinks it might be coming from her vagina and has previously had a hysterectomy.  However there is no blood leaking into her underwear.  Past Medical History:  Diagnosis Date  . Asthma   . Atrial fibrillation ([place])   . DDD (degenerative disc disease), lumbar   . Diabetes mellitus   . HLD (hyperlipidemia)   . Hypertension   . Obesity   . Sleep apnea     Patient Active Problem List   Diagnosis Date Noted  . Weakness 04/08/2015  . Diabetes mellitus without complication ([place]) 38/25/0539  . Anxiety 04/07/2015  . Left-sided weakness 04/07/2015  . Hypertension   . Asthma   . DDD (degenerative disc disease), lumbar   . Obesity   . HLD (hyperlipidemia)   . Hypothyroidism   . Essential hypertension   . Atrial fibrillation ([place])   . Fibroids 03/01/2012  . ALLERGIC RHINITIS 07/05/2006  . LOW BACK PAIN, CHRONIC 07/05/2006  . BACK PAIN 07/05/2006  . DYSURIA 07/05/2006    Past Surgical History:  Procedure Laterality Date  . ABDOMINAL HYSTERECTOMY    . CYSTOSCOPY  02/29/2012   Procedure: CYSTOSCOPY;  Surgeon: Cheri Fowler, MD;  Location: [facility];  Service: Gynecology;  Laterality:  N/A;  . LAPAROSCOPIC ASSISTED VAGINAL HYSTERECTOMY  02/29/2012   Procedure: LAPAROSCOPIC ASSISTED VAGINAL HYSTERECTOMY;  Surgeon: Cheri Fowler, MD;  Location: [facility];  Service: Gynecology;  Laterality: N/A;  . SALPINGOOPHORECTOMY  02/29/2012   Procedure: SALPINGO OOPHORECTOMY;  Surgeon: Cheri Fowler, MD;  Location: [facility];  Service: Gynecology;  Laterality: Bilateral;  . uterine ablation    . VEIN SURGERY       OB History   No obstetric history on file.      Home Medications    Prior to Admission medications   Medication Sig Start Date End Date Taking? Authorizing Provider  acetaminophen (TYLENOL) 500 MG tablet Take 2 tablets (1,000 mg total) by mouth every 8 (eight) hours as needed. 08/06/17   Law, Bea Graff, PA-C  albuterol (PROVENTIL HFA;VENTOLIN HFA) 108 (90 Base) MCG/ACT inhaler Inhale into the lungs.    [provider]  ALPRAZolam Duanne Moron) 0.25 MG tablet Take 0.25 mg by mouth at bedtime as needed. For anxiety    [provider]  amLODipine (NORVASC) 10 MG tablet Take 10 mg by mouth daily.    [provider]  atenolol (TENORMIN) 50 MG tablet Take 25 mg by mouth daily.    [provider]  azithromycin (ZITHROMAX Z-PAK) 250 MG tablet 2 po day one,  then 1 daily x 4 days 06/02/16   Charlesetta Shanks, MD  benzonatate (TESSALON) 100 MG capsule Take 1 capsule (100 mg total) by mouth every 8 (eight) hours. 06/02/16   Charlesetta Shanks, MD  cephALEXin (KEFLEX) 500 MG capsule Take 1 capsule (500 mg total) by mouth 4 (four) times daily. 08/06/17   Law, Bea Graff, PA-C  Cetirizine HCl (ZYRTEC ALLERGY) 10 MG CAPS Take 1 capsule (10 mg total) by mouth daily. 05/14/16   Blanchie Dessert, MD  Cholecalciferol (VITAMIN D PO) Take 1 tablet by mouth daily.    [provider]  diclofenac sodium (VOLTAREN) 1 % GEL Apply 4 g topically 4 (four) times daily. 07/15/15   Palumbo, April, MD  FLUoxetine HCl (PROZAC PO) Take by mouth.    [provider]    glipiZIDE (GLUCOTROL) 5 MG tablet Take 5 mg by mouth daily before breakfast.    [provider]  HYDROcodone-homatropine (HYCODAN) 5-1.5 MG/5ML syrup Take 5 mLs by mouth every 6 (six) hours as needed for cough. 06/02/16   Charlesetta Shanks, MD  Insulin Lispro (HUMALOG [place]) Inject into the skin.    [provider]  loratadine (CLARITIN) 10 MG tablet Take 10 mg by mouth daily as needed for allergies.    [provider]  losartan (COZAAR) 100 MG tablet Take 100 mg by mouth daily.    [provider]  metFORMIN (GLUCOPHAGE) 1000 MG tablet Take 1,000 mg by mouth daily with breakfast.    [provider]  omeprazole (PRILOSEC) 20 MG capsule Take 1 capsule (20 mg total) by mouth daily. 02/01/16   Charlesetta Shanks, MD  predniSONE (DELTASONE) 20 MG tablet 3 tabs po daily x 3 days, then 2 tabs x 3 days, then 1.5 tabs x 3 days, then 1 tab x 3 days, then 0.5 tabs x 3 days 06/10/16   Orpah Greek, MD  ranitidine (ZANTAC) 150 MG capsule Take 1 capsule (150 mg total) by mouth daily. 07/18/17   Fatima Blank, MD  thalidomide (THALOMID) 50 MG capsule Take 25 mg by mouth at bedtime. Take with water.    [provider]  tiZANidine (ZANAFLEX) 4 MG capsule Take 4 mg by mouth 3 (three) times daily.    [provider]    Family History Family History  Problem Relation Age of Onset  . Hypertension Brother   . Diabetes Brother     Social History Social History   Tobacco Use  . Smoking status: Never Smoker  . Smokeless tobacco: Never Used  Substance Use Topics  . Alcohol use: No  . Drug use: No     Allergies   Fluoxetine; Lisinopril; Sertraline; and Insulin glargine   Review of Systems Review of Systems  Constitutional: Negative for fever.  Gastrointestinal: Positive for abdominal pain and diarrhea. Negative for blood in stool and vomiting.  Genitourinary: Positive for flank pain and vaginal bleeding. Negative for hematuria.   All other systems reviewed and are negative.    Physical Exam Updated Vital Signs BP 137/88 (BP Location: Left Wrist)   Pulse 87   Temp 98 F (36.7 C) (Oral)   Resp 16   Ht 5\' 6"  (1.676 m)   Wt 134.3 kg   LMP 02/16/2012   SpO2 98%   BMI 47.78 kg/m   Physical Exam Vitals signs and nursing note reviewed. Exam conducted with a chaperone present.  Constitutional:      General: She is not in acute distress.    Appearance: She  is well-developed. She is obese. She is not ill-appearing or diaphoretic.  HENT:     Head: Normocephalic and atraumatic.     Right Ear: External ear normal.     Left Ear: External ear normal.     Nose: Nose normal.  Eyes:     General:        Right eye: No discharge.        Left eye: No discharge.  Cardiovascular:     Rate and Rhythm: Normal rate and regular rhythm.     Heart sounds: Normal heart sounds.  Pulmonary:     Effort: Pulmonary effort is normal.     Breath sounds: Normal breath sounds.  Abdominal:     Palpations: Abdomen is soft.     Tenderness: There is abdominal tenderness (mild) in the epigastric area. There is right CVA tenderness (mild).     Hernia: A hernia is present. Hernia is present in the ventral area.  Genitourinary:    Exam position: Lithotomy position.     Vagina: No vaginal discharge or bleeding.     Rectum: External hemorrhoid (small) present.     Comments: No gross blood on rectal exam No obvious bleeding in vaginal vault Skin:    General: Skin is warm and dry.  Neurological:     Mental Status: She is alert.  Psychiatric:        Mood and Affect: Mood is not anxious.      ED Treatments / Results  Labs (all labs ordered are listed, but only abnormal results are displayed) Labs Reviewed  WET PREP, GENITAL - Abnormal; Notable for the following components:      Result Value   Clue Cells Wet Prep HPF POC PRESENT (*)    WBC, Wet Prep HPF POC MANY (*)    All other components within normal limits  URINALYSIS,  ROUTINE W REFLEX MICROSCOPIC - Abnormal; Notable for the following components:   Glucose, UA >=500 (*)    Hgb urine dipstick SMALL (*)    Protein, ur 30 (*)    All other components within normal limits  COMPREHENSIVE METABOLIC PANEL - Abnormal; Notable for the following components:   Glucose, Bld 201 (*)    Calcium 8.8 (*)    Total Bilirubin 0.2 (*)    All other components within normal limits  URINALYSIS, MICROSCOPIC (REFLEX) - Abnormal; Notable for the following components:   Bacteria, UA FEW (*)    All other components within normal limits  LIPASE, BLOOD  CBC WITH DIFFERENTIAL/PLATELET  GC/CHLAMYDIA PROBE AMP ([place]) NOT AT [facility]    EKG None  Radiology No results found.  Procedures Procedures (including critical care time)  Medications Ordered in ED Medications - No data to display   Initial Impression / Assessment and Plan / ED Course  I have reviewed the triage vital signs and the nursing notes.  Pertinent labs & imaging results that were available during my care of the patient were reviewed by me and considered in my medical decision making (see chart for details).     I am unable to find an obvious source of bleeding on vaginal or rectal examination.  There is trace blood in her urinalysis, and it could be that this is where the bleeding she saw is coming from.  It does not appear to be a severe amount and her vital signs and lab work are overall reassuring.  The flank pain is probably not related today as this is been a chronic  problem for years.  I do not think repeat imaging is beneficial as she has had multiple negative CT scans in the past.  She is requesting referral to a urologist which I think is reasonable.  She is noted to have WBCs and clue cells on her wet prep but she states that she has not had any concern for STI or vaginal discharge.  This seems unlikely to be BV in this case and the gonorrhea/chlamydia will result in a few days.  If this is positive  she would obviously need treatment.  Final Clinical Impressions(s) / ED Diagnoses   Final diagnoses:  Chronic flank pain  Microscopic hematuria    ED Discharge Orders    None       Sherwood Gambler, MD 04/07/18 1705

## 2018-04-09 LAB — GC/CHLAMYDIA PROBE AMP (~~LOC~~) NOT AT ARMC
CHLAMYDIA, DNA PROBE: NEGATIVE
NEISSERIA GONORRHEA: NEGATIVE

## 2018-07-14 DIAGNOSIS — E1129 Type 2 diabetes mellitus with other diabetic kidney complication: Secondary | ICD-10-CM

## 2018-07-14 HISTORY — DX: Proteinuria, unspecified: E11.29

## 2018-08-08 ENCOUNTER — Emergency Department (HOSPITAL_BASED_OUTPATIENT_CLINIC_OR_DEPARTMENT_OTHER)
Admission: EM | Admit: 2018-08-08 | Discharge: 2018-08-08 | Disposition: A | Discharge status: Home / Self Care | Payer: Medicare Other | Attending: Emergency Medicine | Admitting: Emergency Medicine

## 2018-08-08 ENCOUNTER — Encounter (HOSPITAL_BASED_OUTPATIENT_CLINIC_OR_DEPARTMENT_OTHER): Payer: Self-pay | Admitting: Emergency Medicine

## 2018-08-08 ENCOUNTER — Other Ambulatory Visit: Payer: Self-pay

## 2018-08-08 DIAGNOSIS — W4904XA Ring or other jewelry causing external constriction, initial encounter: Secondary | ICD-10-CM | POA: Diagnosis not present

## 2018-08-08 DIAGNOSIS — R600 Localized edema: Secondary | ICD-10-CM | POA: Diagnosis not present

## 2018-08-08 DIAGNOSIS — J45909 Unspecified asthma, uncomplicated: Secondary | ICD-10-CM | POA: Insufficient documentation

## 2018-08-08 DIAGNOSIS — I10 Essential (primary) hypertension: Secondary | ICD-10-CM | POA: Diagnosis not present

## 2018-08-08 DIAGNOSIS — E039 Hypothyroidism, unspecified: Secondary | ICD-10-CM | POA: Insufficient documentation

## 2018-08-08 DIAGNOSIS — M79645 Pain in left finger(s): Secondary | ICD-10-CM | POA: Diagnosis not present

## 2018-08-08 DIAGNOSIS — E119 Type 2 diabetes mellitus without complications: Secondary | ICD-10-CM | POA: Diagnosis not present

## 2018-08-08 NOTE — ED Notes (Signed)
ED Provider at bedside. 

## 2018-08-08 NOTE — ED Provider Notes (Signed)
Emergency Department Provider Note   I have reviewed the triage vital signs and the nursing notes.   HISTORY  Chief Complaint Hand Problem   HPI Tonya Quinn is a 53 y.o. female for left ring finger swelling and pain after putting a ring on this morning.  Patient states she has not worn the ring for several years and thrown this morning.  She has had increased finger swelling throughout the day and cannot remove it this evening when she attempted.  She is having increasing pain in the left ring finger.  She is noticed some swelling as well.  Some pain radiating up into the arm.  Patient tried lubricating the finger and pulling the ring but could not get it off.  No bleeding.   Past Medical History:  Diagnosis Date  . Asthma   . Atrial fibrillation ([place])   . DDD (degenerative disc disease), lumbar   . Diabetes mellitus   . HLD (hyperlipidemia)   . Hypertension   . Obesity   . Sleep apnea     Patient Active Problem List   Diagnosis Date Noted  . Weakness 04/08/2015  . Diabetes mellitus without complication ([place]) 17/51/0258  . Anxiety 04/07/2015  . Left-sided weakness 04/07/2015  . Hypertension   . Asthma   . DDD (degenerative disc disease), lumbar   . Obesity   . HLD (hyperlipidemia)   . Hypothyroidism   . Essential hypertension   . Atrial fibrillation ([place])   . Fibroids 03/01/2012  . ALLERGIC RHINITIS 07/05/2006  . LOW BACK PAIN, CHRONIC 07/05/2006  . BACK PAIN 07/05/2006  . DYSURIA 07/05/2006    Past Surgical History:  Procedure Laterality Date  . ABDOMINAL HYSTERECTOMY    . CYSTOSCOPY  02/29/2012   Procedure: CYSTOSCOPY;  Surgeon: Cheri Fowler, MD;  Location: [facility];  Service: Gynecology;  Laterality: N/A;  . LAPAROSCOPIC ASSISTED VAGINAL HYSTERECTOMY  02/29/2012   Procedure: LAPAROSCOPIC ASSISTED VAGINAL HYSTERECTOMY;  Surgeon: Cheri Fowler, MD;  Location: [facility];  Service: Gynecology;  Laterality: N/A;  . SALPINGOOPHORECTOMY  02/29/2012   Procedure: SALPINGO OOPHORECTOMY;  Surgeon: Cheri Fowler, MD;  Location: [facility];  Service: Gynecology;  Laterality: Bilateral;  . uterine ablation    . VEIN SURGERY      Allergies Fluoxetine; Lisinopril; Sertraline; and Insulin glargine  Family History  Problem Relation Age of Onset  . Hypertension Brother   . Diabetes Brother     Social History Social History   Tobacco Use  . Smoking status: Never Smoker  . Smokeless tobacco: Never Used  Substance Use Topics  . Alcohol use: No  . Drug use: No    Review of Systems  Constitutional: No fever/chills Musculoskeletal: Left finger swelling and pain with ring stuck.  Skin: Negative for rash. Neurological: Negative for headaches, focal weakness or numbness.  10-point ROS otherwise negative.  ____________________________________________   PHYSICAL EXAM:  VITAL SIGNS: ED Triage Vitals  Enc Vitals Group     BP 08/08/18 2047 (!) 158/113     Pulse Rate 08/08/18 2047 93     Resp 08/08/18 2047 18     Temp 08/08/18 2047 98.7 F (37.1 C)     Temp src --      SpO2 08/08/18 2047 96 %     Weight 08/08/18 2044 (!) 303 lb (137.4 kg)     Height 08/08/18 2044 5\' 6"  (1.676 m)   Constitutional: Alert and oriented. Well appearing and in no acute distress. Eyes: Conjunctivae are normal.  Head: Atraumatic. Nose: No congestion/rhinnorhea. Mouth/Throat: Mucous membranes are moist. Neck: No stridor.  Cardiovascular: Good peripheral circulation. Respiratory: Normal respiratory effort.  Gastrointestinal:  No distention.  Musculoskeletal: Swelling of the left ring finger.  Neurologic:  Normal speech and language. Skin:  Skin is warm and dry. Finger with normal coloration but swelling noted.   ____________________________________________  RADIOLOGY  None  ____________________________________________   PROCEDURES  Procedure(s) performed:   .Foreign Body Removal Date/Time: 08/08/2018 10:56 PM Performed by: Margette Fast, MD  Authorized by: Margette Fast, MD  Consent: Verbal consent obtained. Consent given by: patient Patient identity confirmed: verbally with patient Intake: Left ring finger.  Sedation: Patient sedated: no  Patient restrained: no Complexity: simple 1 objects recovered. Objects recovered: ring Post-procedure assessment: foreign body removed Patient tolerance: Patient tolerated the procedure well with no immediate complications Comments: Manual ring cutter used to remove ring on finger. Removed without difficulty. No injury to skin or breakdown/bleeding. Normal coloration and improved swelling afterwards.      ____________________________________________   INITIAL IMPRESSION / ASSESSMENT AND PLAN / ED COURSE  Pertinent labs & imaging results that were available during my care of the patient were reviewed by me and considered in my medical decision making (see chart for details).   Patient presents to the emergency department for evaluation of left finger pain and swelling.  Swelling appears to be related to a ring which placed on the finger earlier today.  Attempted to remove this with lubricating the finger and dental floss to reduce swelling but this was not successful.  I obtained verbal consent to cut the ring which the patient provided.  This was cut as per procedure note above.  No injury to the skin or finger during removal.  Ring was returned to the patient.    ____________________________________________  FINAL CLINICAL IMPRESSION(S) / ED DIAGNOSES  Final diagnoses:  Ring or other jewelry causing external constriction, initial encounter    Note:  This document was prepared using Dragon voice recognition software and may include unintentional dictation errors.  Nanda Quinton, MD Emergency Medicine    , [facility], MD 08/08/18 2258

## 2018-08-08 NOTE — Discharge Instructions (Signed)
You can take Tylenol and/or Motrin for finger pain today.  Apply ice as needed.  Return to the emergency department with any new or worsening swelling.

## 2018-08-08 NOTE — ED Triage Notes (Signed)
Pt reports swelling to left hand and needs help getting rings off.

## 2018-10-16 DIAGNOSIS — Z794 Long term (current) use of insulin: Secondary | ICD-10-CM

## 2018-10-16 DIAGNOSIS — H43393 Other vitreous opacities, bilateral: Secondary | ICD-10-CM

## 2018-10-16 DIAGNOSIS — Z7984 Long term (current) use of oral hypoglycemic drugs: Secondary | ICD-10-CM

## 2018-10-16 DIAGNOSIS — H2513 Age-related nuclear cataract, bilateral: Secondary | ICD-10-CM

## 2018-10-16 DIAGNOSIS — H5203 Hypermetropia, bilateral: Secondary | ICD-10-CM

## 2018-10-16 HISTORY — DX: Hypermetropia, bilateral: H52.03

## 2018-10-16 HISTORY — DX: Age-related nuclear cataract, bilateral: H25.13

## 2018-10-16 HISTORY — DX: Long term (current) use of oral hypoglycemic drugs: Z79.84

## 2018-10-16 HISTORY — DX: Long term (current) use of insulin: Z79.4

## 2018-10-16 HISTORY — DX: Other vitreous opacities, bilateral: H43.393

## 2018-10-23 DIAGNOSIS — E1142 Type 2 diabetes mellitus with diabetic polyneuropathy: Secondary | ICD-10-CM

## 2018-10-23 HISTORY — DX: Type 2 diabetes mellitus with diabetic polyneuropathy: E11.42

## 2018-12-27 DIAGNOSIS — E1169 Type 2 diabetes mellitus with other specified complication: Secondary | ICD-10-CM | POA: Insufficient documentation

## 2018-12-27 DIAGNOSIS — E1140 Type 2 diabetes mellitus with diabetic neuropathy, unspecified: Secondary | ICD-10-CM

## 2018-12-27 HISTORY — DX: Type 2 diabetes mellitus with other specified complication: E11.69

## 2018-12-27 HISTORY — DX: Type 2 diabetes mellitus with diabetic neuropathy, unspecified: E11.40

## 2019-07-11 DIAGNOSIS — G8929 Other chronic pain: Secondary | ICD-10-CM

## 2019-07-11 DIAGNOSIS — M542 Cervicalgia: Secondary | ICD-10-CM | POA: Insufficient documentation

## 2019-07-11 DIAGNOSIS — M5134 Other intervertebral disc degeneration, thoracic region: Secondary | ICD-10-CM

## 2019-07-11 HISTORY — DX: Other chronic pain: G89.29

## 2019-07-11 HISTORY — DX: Cervicalgia: M54.2

## 2019-07-11 HISTORY — DX: Other intervertebral disc degeneration, thoracic region: M51.34

## 2019-09-30 DIAGNOSIS — Z90710 Acquired absence of both cervix and uterus: Secondary | ICD-10-CM

## 2019-09-30 HISTORY — DX: Acquired absence of both cervix and uterus: Z90.710

## 2019-12-10 ENCOUNTER — Other Ambulatory Visit: Payer: Self-pay | Admitting: Family Medicine

## 2019-12-10 DIAGNOSIS — Z1231 Encounter for screening mammogram for malignant neoplasm of breast: Secondary | ICD-10-CM

## 2019-12-24 ENCOUNTER — Other Ambulatory Visit: Payer: Self-pay

## 2019-12-24 ENCOUNTER — Ambulatory Visit: Payer: Medicare Other

## 2020-02-26 ENCOUNTER — Ambulatory Visit: Payer: Medicare Other | Attending: Family Medicine | Admitting: Physical Therapy

## 2020-02-26 ENCOUNTER — Encounter: Payer: Self-pay | Admitting: Physical Therapy

## 2020-02-26 ENCOUNTER — Other Ambulatory Visit: Payer: Self-pay

## 2020-02-26 DIAGNOSIS — R262 Difficulty in walking, not elsewhere classified: Secondary | ICD-10-CM | POA: Insufficient documentation

## 2020-02-26 DIAGNOSIS — M25552 Pain in left hip: Secondary | ICD-10-CM | POA: Insufficient documentation

## 2020-02-26 DIAGNOSIS — M25562 Pain in left knee: Secondary | ICD-10-CM | POA: Diagnosis present

## 2020-02-26 NOTE — Therapy (Signed)
[facility]. [place], [place], [address] Phone: [phone]   Fax:  [phone]  Physical Therapy Evaluation  Patient Details  Name: Tonya Quinn MRN: [id number] Date of Birth: September 29, 1965 Referring Provider (PT): Francesca Jewett Date: 02/26/2020   PT End of Session - 02/26/20 1012    Visit Number 1    Date for PT Re-Evaluation 04/28/20    Authorization Type [employer]    PT Start Time 0800    PT Stop Time 0850    PT Time Calculation (min) 50 min    Activity Tolerance Patient tolerated treatment well    Behavior During Therapy [facility] for tasks assessed/performed           Past Medical History:  Diagnosis Date  . Asthma   . Atrial fibrillation ([place])   . DDD (degenerative disc disease), lumbar   . Diabetes mellitus   . HLD (hyperlipidemia)   . Hypertension   . Obesity   . Sleep apnea     Past Surgical History:  Procedure Laterality Date  . ABDOMINAL HYSTERECTOMY    . CYSTOSCOPY  02/29/2012   Procedure: CYSTOSCOPY;  Surgeon: Cheri Fowler, MD;  Location: [facility];  Service: Gynecology;  Laterality: N/A;  . LAPAROSCOPIC ASSISTED VAGINAL HYSTERECTOMY  02/29/2012   Procedure: LAPAROSCOPIC ASSISTED VAGINAL HYSTERECTOMY;  Surgeon: Cheri Fowler, MD;  Location: [facility];  Service: Gynecology;  Laterality: N/A;  . SALPINGOOPHORECTOMY  02/29/2012   Procedure: SALPINGO OOPHORECTOMY;  Surgeon: Cheri Fowler, MD;  Location: [facility];  Service: Gynecology;  Laterality: Bilateral;  . uterine ablation    . VEIN SURGERY      There were no vitals filed for this visit.    Subjective Assessment - 02/26/20 0810    Subjective Patient reports that she woke up about 3 weeks ago   She is unsure of a cause, reports that she has neuropathy.  She had an injection in the left knee that helped for 3 days and then the pain returned, has pain in the left hip, left knee and the back.  Reports difficulty walking    Limitations  Lifting;Standing;Walking;House hold activities    Patient Stated Goals have less pain and walk better    Currently in Pain? Yes    Pain Score 3     Pain Location Knee   left hip and the left great toe   Pain Orientation Left    Pain Descriptors / Indicators Aching    Pain Type Acute pain    Pain Radiating Towards reports some numbness inthe feet    Pain Onset 1 to 4 weeks ago    Pain Frequency Constant    Aggravating Factors  lying down, walking, getting up from sitting, pain up to 10/10    Pain Relieving Factors rest, rub it, re position pain at best a 3/10    Effect of Pain on Daily Activities difficulty walking, shopping              [facility] PT Assessment - 02/26/20 0001      Assessment   Medical Diagnosis left hip and knee pain    Referring Provider (PT) Rip Harbour    Onset Date/Surgical Date 02/18/20    Prior Therapy no      Precautions   Precautions None      Balance Screen   Has the patient fallen in the past 6 months No    Has the patient had a decrease in activity level  because of a fear of falling?  No    Is the patient reluctant to leave their home because of a fear of falling?  No      Home Environment   Additional Comments no stairs, does housework      Prior Function   Level of Independence Independent    Vocation Unemployed    Leisure no exercise      Posture/Postural Control   Posture Comments fwd head, rounded shoulders      ROM / Strength   AROM / PROM / Strength AROM;Strength      AROM   Overall AROM Comments left hip flexion to 60 degrees, abduction 15, extension 0 degrees, knee AROM 0-100 degrees flexion,      Strength   Overall Strength Comments left hip 4-/5, left knee 3+/5      Flexibility   Soft Tissue Assessment /Muscle Length yes    Hamstrings tight with pain in the knee    ITB tight      Palpation   Palpation comment very tender in the left GT area, tender in the lateral knee and the patellar tendon area      Ambulation/Gait    Gait Comments gait is shuffling pattern, more foot shuffle on the left, very slow                      Objective measurements completed on examination: See above findings.               PT Education - 02/26/20 1012    Education Details HEP for Wms flexion    Person(s) Educated Patient    Methods Explanation;Demonstration    Comprehension Verbalized understanding            PT Short Term Goals - 02/26/20 1017      PT SHORT TERM GOAL #1   Title independent with initial HEP    Time 2    Period Weeks    Status New             PT Long Term Goals - 02/26/20 1017      PT LONG TERM GOAL #1   Title understand posture and body mechanics    Time 8    Status New      PT LONG TERM GOAL #2   Title decrease pain 50%    Time 8    Period Weeks    Status New      PT LONG TERM GOAL #3   Title walk wihtout limp    Time 8    Period Weeks    Status New                  Plan - 02/26/20 1013    Clinical Impression Statement Patient reports that she started having left knee and hip and foot pain in the past few months, she is unsure of any specific cause reports that the MD said she had arthritis.  She is tender in the left knee and the left hip into the left buttock, she has weakness in these areas due to pain.  She shuffles when she walks and reports a few stumbles.    Stability/Clinical Decision Making Stable/Uncomplicated    Clinical Decision Making Low    Rehab Potential Good    PT Frequency 2x / week    PT Duration 8 weeks    PT Treatment/Interventions ADLs/Self Care Home Management;Cryotherapy;Electrical Stimulation;Iontophoresis 4mg /ml Dexamethasone;Moist Heat;Gait training;Neuromuscular re-education;Balance training;Therapeutic  exercise;Therapeutic activities;Functional mobility training;Stair training;Patient/family education;Manual techniques    PT Next Visit Plan work on motions and movements    Consulted and Agree with Plan of Care  Patient           Patient will benefit from skilled therapeutic intervention in order to improve the following deficits and impairments:  Abnormal gait,Decreased range of motion,Difficulty walking,Decreased endurance,Decreased activity tolerance,Pain,Impaired flexibility,Improper body mechanics,Decreased mobility,Decreased strength,Increased muscle spasms  Visit Diagnosis: Pain in left hip - Plan: PT plan of care cert/re-cert  Acute pain of left knee - Plan: PT plan of care cert/re-cert  Difficulty in walking, not elsewhere classified - Plan: PT plan of care cert/re-cert     Problem List Patient Active Problem List   Diagnosis Date Noted  . Weakness 04/08/2015  . Diabetes mellitus without complication ([place]) 83/25/4982  . Anxiety 04/07/2015  . Left-sided weakness 04/07/2015  . Hypertension   . Asthma   . DDD (degenerative disc disease), lumbar   . Obesity   . HLD (hyperlipidemia)   . Hypothyroidism   . Essential hypertension   . Atrial fibrillation ([place])   . Fibroids 03/01/2012  . ALLERGIC RHINITIS 07/05/2006  . LOW BACK PAIN, CHRONIC 07/05/2006  . BACK PAIN 07/05/2006  . DYSURIA 07/05/2006    Sumner Boast., PT 02/26/2020, 11:49 AM  [place]. [place], [place], [address] Phone: [phone]   Fax:  [phone]  Name: Tonya Quinn MRN: [id number] Date of Birth: [birth year]

## 2020-03-02 ENCOUNTER — Ambulatory Visit: Payer: Medicare Other | Admitting: Physical Therapy

## 2020-03-02 ENCOUNTER — Encounter: Payer: Self-pay | Admitting: Physical Therapy

## 2020-03-02 ENCOUNTER — Other Ambulatory Visit: Payer: Self-pay

## 2020-03-02 DIAGNOSIS — M25562 Pain in left knee: Secondary | ICD-10-CM

## 2020-03-02 DIAGNOSIS — M25552 Pain in left hip: Secondary | ICD-10-CM | POA: Diagnosis not present

## 2020-03-02 DIAGNOSIS — R262 Difficulty in walking, not elsewhere classified: Secondary | ICD-10-CM

## 2020-03-02 NOTE — Therapy (Signed)
[facility]. [place], [place], [address] Phone: [phone]   Fax:  [phone]  Physical Therapy Treatment  Patient Details  Name: Tonya Quinn MRN: [id number] Date of Birth: Dec 23, 1965 Referring Provider (PT): Francesca Jewett Date: 03/02/2020   PT End of Session - 03/02/20 1007    Visit Number 2    Date for PT Re-Evaluation 04/28/20    Authorization Type [employer]    PT Start Time 0930    PT Stop Time 1012    PT Time Calculation (min) 42 min    Activity Tolerance Patient tolerated treatment well    Behavior During Therapy [facility] for tasks assessed/performed           Past Medical History:  Diagnosis Date  . Asthma   . Atrial fibrillation ([place])   . DDD (degenerative disc disease), lumbar   . Diabetes mellitus   . HLD (hyperlipidemia)   . Hypertension   . Obesity   . Sleep apnea     Past Surgical History:  Procedure Laterality Date  . ABDOMINAL HYSTERECTOMY    . CYSTOSCOPY  02/29/2012   Procedure: CYSTOSCOPY;  Surgeon: Cheri Fowler, MD;  Location: [facility];  Service: Gynecology;  Laterality: N/A;  . LAPAROSCOPIC ASSISTED VAGINAL HYSTERECTOMY  02/29/2012   Procedure: LAPAROSCOPIC ASSISTED VAGINAL HYSTERECTOMY;  Surgeon: Cheri Fowler, MD;  Location: [facility];  Service: Gynecology;  Laterality: N/A;  . SALPINGOOPHORECTOMY  02/29/2012   Procedure: SALPINGO OOPHORECTOMY;  Surgeon: Cheri Fowler, MD;  Location: [facility];  Service: Gynecology;  Laterality: Bilateral;  . uterine ablation    . VEIN SURGERY      There were no vitals filed for this visit.   Subjective Assessment - 03/02/20 0934    Subjective Almost went to the emergency room yesterday due to pain. Today she is better.    Currently in Pain? No/denies                             [facility] Adult PT Treatment/Exercise - 03/02/20 0001      Exercises   Exercises Knee/Hip      Knee/Hip Exercises: Aerobic   Nustep L2 x 5  min      Knee/Hip Exercises: Seated   Long Arc Quad Left;2 sets;10 reps    Long Arc Quad Weight 2 lbs.    Ball Squeeze 2x10 3 sec    Hamstring Curl Both;2 sets;10 reps    Hamstring Limitations red tband    Sit to Sand 2 sets;10 reps;without UE support                    PT Short Term Goals - 02/26/20 1017      PT SHORT TERM GOAL #1   Title independent with initial HEP    Time 2    Period Weeks    Status New             PT Long Term Goals - 02/26/20 1017      PT LONG TERM GOAL #1   Title understand posture and body mechanics    Time 8    Status New      PT LONG TERM GOAL #2   Title decrease pain 50%    Time 8    Period Weeks    Status New      PT LONG TERM GOAL #3   Title walk wihtout limp  Time 8    Period Weeks    Status New                 Plan - 03/02/20 1007    Clinical Impression Statement Pt able to complete today's interventions. She enters clinic reporting no pain, but has L knee pain yesterday. Pt has a decrease activity tolerance requiring multiple rest breaks. No issues with LAQ but reports L hip discomfort with HS curls. Cues for sequencing needed with sit to stands. One rest break needed to complete 6 min on NuStep    Stability/Clinical Decision Making Stable/Uncomplicated    Rehab Potential Good    PT Frequency 2x / week    PT Duration 8 weeks    PT Treatment/Interventions ADLs/Self Care Home Management;Cryotherapy;Electrical Stimulation;Iontophoresis 4mg /ml Dexamethasone;Moist Heat;Gait training;Neuromuscular re-education;Balance training;Therapeutic exercise;Therapeutic activities;Functional mobility training;Stair training;Patient/family education;Manual techniques    PT Next Visit Plan work on motions and movements           Patient will benefit from skilled therapeutic intervention in order to improve the following deficits and impairments:  Abnormal gait,Decreased range of motion,Difficulty walking,Decreased  endurance,Decreased activity tolerance,Pain,Impaired flexibility,Improper body mechanics,Decreased mobility,Decreased strength,Increased muscle spasms  Visit Diagnosis: Pain in left hip  Acute pain of left knee  Difficulty in walking, not elsewhere classified     Problem List Patient Active Problem List   Diagnosis Date Noted  . Weakness 04/08/2015  . Diabetes mellitus without complication ([place]) 05/69/7948  . Anxiety 04/07/2015  . Left-sided weakness 04/07/2015  . Hypertension   . Asthma   . DDD (degenerative disc disease), lumbar   . Obesity   . HLD (hyperlipidemia)   . Hypothyroidism   . Essential hypertension   . Atrial fibrillation ([place])   . Fibroids 03/01/2012  . ALLERGIC RHINITIS 07/05/2006  . LOW BACK PAIN, CHRONIC 07/05/2006  . BACK PAIN 07/05/2006  . DYSURIA 07/05/2006    Scot Jun, PTA 03/02/2020, 10:12 AM  [place]. [place], [place], [address] Phone: [phone]   Fax:  [phone]  Name: Keylie Beavers MRN: [id number] Date of Birth: [birth year]

## 2020-03-04 ENCOUNTER — Other Ambulatory Visit: Payer: Self-pay

## 2020-03-04 ENCOUNTER — Encounter: Payer: Self-pay | Admitting: Physical Therapy

## 2020-03-04 ENCOUNTER — Ambulatory Visit: Payer: Medicare Other | Admitting: Physical Therapy

## 2020-03-04 DIAGNOSIS — R262 Difficulty in walking, not elsewhere classified: Secondary | ICD-10-CM

## 2020-03-04 DIAGNOSIS — M25552 Pain in left hip: Secondary | ICD-10-CM

## 2020-03-04 DIAGNOSIS — M25562 Pain in left knee: Secondary | ICD-10-CM

## 2020-03-04 NOTE — Therapy (Signed)
[facility]. [place], [place], [address] Phone: [phone]   Fax:  [phone]  Physical Therapy Treatment  Patient Details  Name: Tonya Quinn MRN: [id number] Date of Birth: [birth year] Referring Provider (PT): Francesca Jewett Date: 03/04/2020   PT End of Session - 03/04/20 1001    Visit Number 3    Date for PT Re-Evaluation 04/28/20    Authorization Type [employer]    PT Start Time 0922    PT Stop Time 1010    PT Time Calculation (min) 48 min    Activity Tolerance Patient tolerated treatment well    Behavior During Therapy [facility] for tasks assessed/performed           Past Medical History:  Diagnosis Date  . Asthma   . Atrial fibrillation ([place])   . DDD (degenerative disc disease), lumbar   . Diabetes mellitus   . HLD (hyperlipidemia)   . Hypertension   . Obesity   . Sleep apnea     Past Surgical History:  Procedure Laterality Date  . ABDOMINAL HYSTERECTOMY    . CYSTOSCOPY  02/29/2012   Procedure: CYSTOSCOPY;  Surgeon: Cheri Fowler, MD;  Location: [facility];  Service: Gynecology;  Laterality: N/A;  . LAPAROSCOPIC ASSISTED VAGINAL HYSTERECTOMY  02/29/2012   Procedure: LAPAROSCOPIC ASSISTED VAGINAL HYSTERECTOMY;  Surgeon: Cheri Fowler, MD;  Location: [facility];  Service: Gynecology;  Laterality: N/A;  . SALPINGOOPHORECTOMY  02/29/2012   Procedure: SALPINGO OOPHORECTOMY;  Surgeon: Cheri Fowler, MD;  Location: [facility];  Service: Gynecology;  Laterality: Bilateral;  . uterine ablation    . VEIN SURGERY      There were no vitals filed for this visit.   Subjective Assessment - 03/04/20 0926    Subjective Patient reports maybe a little better, reports pain in the low back more today.    Currently in Pain? Yes    Pain Score 6     Pain Location Back    Pain Orientation Lower    Pain Descriptors / Indicators Aching;Sore    Aggravating Factors  activity  Reports that she does remember a fall in October  in the kitchen                             [name] Adult PT Treatment/Exercise - 03/04/20 0001      Knee/Hip Exercises: Aerobic   Nustep L2 x 6 min      Knee/Hip Exercises: Seated   Long Arc Quad Left;2 sets;10 reps    Long Arc Quad Weight 2 lbs.    Ball Squeeze 2x10 3 sec    Other Seated Knee/Hip Exercises PWR moves while sitting for lumbar ROM, red tband scap stab    Other Seated Knee/Hip Exercises on sit fit pelvic mobility and added some stability, ball in lap isometric abs    Hamstring Curl Both;2 sets;10 reps    Hamstring Limitations red tband                    PT Short Term Goals - 03/04/20 1003      PT SHORT TERM GOAL #1   Title independent with initial HEP    Status Partially Met             PT Long Term Goals - 02/26/20 1017      PT LONG TERM GOAL #1   Title understand posture and body mechanics  Time 8    Status New      PT LONG TERM GOAL #2   Title decrease pain 50%    Time 8    Period Weeks    Status New      PT LONG TERM GOAL #3   Title walk wihtout limp    Time 8    Period Weeks    Status New                 Plan - 03/04/20 1002    Clinical Impression Statement Patient very lethargic, cues to keep moving and to do the exercises, needed a lot of cues to do the core activation exercises correctly.  Continued to stop during activities for rest, really doing light activities.    PT Next Visit Plan work on motions and movements    Consulted and Agree with Plan of Care Patient           Patient will benefit from skilled therapeutic intervention in order to improve the following deficits and impairments:  Abnormal gait,Decreased range of motion,Difficulty walking,Decreased endurance,Decreased activity tolerance,Pain,Impaired flexibility,Improper body mechanics,Decreased mobility,Decreased strength,Increased muscle spasms  Visit Diagnosis: Pain in left hip  Acute pain of left knee  Difficulty in walking,  not elsewhere classified     Problem List Patient Active Problem List   Diagnosis Date Noted  . Weakness 04/08/2015  . Diabetes mellitus without complication ([place]) 32/20/2542  . Anxiety 04/07/2015  . Left-sided weakness 04/07/2015  . Hypertension   . Asthma   . DDD (degenerative disc disease), lumbar   . Obesity   . HLD (hyperlipidemia)   . Hypothyroidism   . Essential hypertension   . Atrial fibrillation ([place])   . Fibroids 03/01/2012  . ALLERGIC RHINITIS 07/05/2006  . LOW BACK PAIN, CHRONIC 07/05/2006  . BACK PAIN 07/05/2006  . DYSURIA 07/05/2006    Sumner Boast., PT 03/04/2020, 10:04 AM  [place]. [place], [place], [address] Phone: [phone]   Fax:  [phone]  Name: Tonya Quinn MRN: [id number] Date of Birth: 08-26-1965

## 2020-03-09 ENCOUNTER — Ambulatory Visit: Payer: Medicare Other | Admitting: Physical Therapy

## 2020-03-11 ENCOUNTER — Ambulatory Visit: Payer: Medicare Other | Admitting: Physical Therapy

## 2020-04-02 ENCOUNTER — Other Ambulatory Visit: Payer: Self-pay | Admitting: Family Medicine

## 2020-04-02 DIAGNOSIS — M25562 Pain in left knee: Secondary | ICD-10-CM

## 2020-04-06 ENCOUNTER — Emergency Department: Payer: Medicare Other

## 2020-04-06 ENCOUNTER — Other Ambulatory Visit: Payer: Self-pay

## 2020-04-06 ENCOUNTER — Emergency Department
Admission: EM | Admit: 2020-04-06 | Discharge: 2020-04-06 | Disposition: A | Discharge status: Home / Self Care | Payer: Medicare Other | Attending: Emergency Medicine | Admitting: Emergency Medicine

## 2020-04-06 DIAGNOSIS — Z79899 Other long term (current) drug therapy: Secondary | ICD-10-CM | POA: Diagnosis not present

## 2020-04-06 DIAGNOSIS — E785 Hyperlipidemia, unspecified: Secondary | ICD-10-CM | POA: Insufficient documentation

## 2020-04-06 DIAGNOSIS — Z7984 Long term (current) use of oral hypoglycemic drugs: Secondary | ICD-10-CM | POA: Diagnosis not present

## 2020-04-06 DIAGNOSIS — R519 Headache, unspecified: Secondary | ICD-10-CM | POA: Insufficient documentation

## 2020-04-06 DIAGNOSIS — J45909 Unspecified asthma, uncomplicated: Secondary | ICD-10-CM | POA: Insufficient documentation

## 2020-04-06 DIAGNOSIS — Y92410 Unspecified street and highway as the place of occurrence of the external cause: Secondary | ICD-10-CM | POA: Diagnosis not present

## 2020-04-06 DIAGNOSIS — E1169 Type 2 diabetes mellitus with other specified complication: Secondary | ICD-10-CM | POA: Diagnosis not present

## 2020-04-06 DIAGNOSIS — M542 Cervicalgia: Secondary | ICD-10-CM | POA: Insufficient documentation

## 2020-04-06 DIAGNOSIS — R079 Chest pain, unspecified: Secondary | ICD-10-CM | POA: Insufficient documentation

## 2020-04-06 DIAGNOSIS — I10 Essential (primary) hypertension: Secondary | ICD-10-CM | POA: Insufficient documentation

## 2020-04-06 DIAGNOSIS — S3991XA Unspecified injury of abdomen, initial encounter: Secondary | ICD-10-CM | POA: Insufficient documentation

## 2020-04-06 LAB — CBC
HCT: 40.4 % (ref 36.0–46.0)
Hemoglobin: 12.8 g/dL (ref 12.0–15.0)
MCH: 27.4 pg (ref 26.0–34.0)
MCHC: 31.7 g/dL (ref 30.0–36.0)
MCV: 86.5 fL (ref 80.0–100.0)
Platelets: 158 10*3/uL (ref 150–400)
RBC: 4.67 MIL/uL (ref 3.87–5.11)
RDW: 14.6 % (ref 11.5–15.5)
WBC: 8.4 10*3/uL (ref 4.0–10.5)
nRBC: 0 % (ref 0.0–0.2)

## 2020-04-06 LAB — BASIC METABOLIC PANEL
Anion gap: 11 (ref 5–15)
BUN: 17 mg/dL (ref 6–20)
CO2: 27 mmol/L (ref 22–32)
Calcium: 9.4 mg/dL (ref 8.9–10.3)
Chloride: 101 mmol/L (ref 98–111)
Creatinine, Ser: 0.84 mg/dL (ref 0.44–1.00)
GFR, Estimated: >60 mL/min (ref >60)
Glucose, Bld: 151 mg/dL — ABNORMAL HIGH (ref 70–99)
Potassium: 3.6 mmol/L (ref 3.5–5.1)
Sodium: 139 mmol/L (ref 135–145)

## 2020-04-06 LAB — TROPONIN I (HIGH SENSITIVITY): Troponin I (High Sensitivity): 2 ng/L (ref <18)

## 2020-04-06 MED ORDER — BACLOFEN 5 MG PO TABS: 5.0000 mg | ORAL_TABLET | Freq: Three times a day (TID) | ORAL | 0 refills | Status: DC | PRN

## 2020-04-06 MED ORDER — IOHEXOL 300 MG/ML  SOLN
125.0000 mL | Freq: Once | INTRAMUSCULAR | Status: AC | PRN
  Administered 2020-04-06: 125 mL via INTRAVENOUS
  Filled 2020-04-06: qty 125

## 2020-04-06 MED ORDER — ACETAMINOPHEN 500 MG PO TABS: 500.0000 mg | ORAL_TABLET | Freq: Four times a day (QID) | ORAL | 0 refills | Status: DC | PRN

## 2020-04-06 MED ORDER — IPRATROPIUM-ALBUTEROL 0.5-2.5 (3) MG/3ML IN SOLN
3.0000 mL | Freq: Once | RESPIRATORY_TRACT | Status: AC
  Administered 2020-04-06: 3 mL via RESPIRATORY_TRACT
  Filled 2020-04-06: qty 3
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
## 2020-04-06 NOTE — ED Notes (Signed)
Patient discharged to home per MD order. Patient in stable condition, and deemed medically cleared by ED provider for discharge. Discharge instructions reviewed with patient/family using "Teach Back"; verbalized understanding of medication education and administration, and information about follow-up care. Denies further concerns. ° °
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
## 2020-04-08 ENCOUNTER — Ambulatory Visit: Payer: Medicare Other

## 2020-04-21 ENCOUNTER — Ambulatory Visit
Admission: RE | Admit: 2020-04-21 | Discharge: 2020-04-21 | Disposition: A | Discharge status: Home / Self Care | Payer: Medicare Other | Source: Ambulatory Visit | Attending: Family Medicine | Admitting: Family Medicine

## 2020-04-21 ENCOUNTER — Other Ambulatory Visit: Payer: Self-pay

## 2020-04-21 DIAGNOSIS — M25562 Pain in left knee: Secondary | ICD-10-CM

## 2020-04-22 ENCOUNTER — Ambulatory Visit: Payer: Medicare Other | Attending: Family Medicine

## 2020-04-22 DIAGNOSIS — R262 Difficulty in walking, not elsewhere classified: Secondary | ICD-10-CM | POA: Diagnosis present

## 2020-04-22 DIAGNOSIS — M25552 Pain in left hip: Secondary | ICD-10-CM | POA: Diagnosis present

## 2020-04-22 DIAGNOSIS — M25562 Pain in left knee: Secondary | ICD-10-CM

## 2020-04-22 DIAGNOSIS — M62838 Other muscle spasm: Secondary | ICD-10-CM | POA: Diagnosis present

## 2020-04-22 DIAGNOSIS — M25571 Pain in right ankle and joints of right foot: Secondary | ICD-10-CM | POA: Diagnosis present

## 2020-04-22 DIAGNOSIS — M6281 Muscle weakness (generalized): Secondary | ICD-10-CM

## 2020-04-22 DIAGNOSIS — M25662 Stiffness of left knee, not elsewhere classified: Secondary | ICD-10-CM
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
## 2020-04-22 NOTE — Patient Instructions (Signed)
Access Code: [email or web address] URL: [email or web address] Date: 04/22/2020 Prepared by: Sherlynn Stalls  Exercises Seated Long Arc Quad - 1 x daily - 5 x weekly - 2 sets - 10 reps - 3 hold Seated Hip Abduction with Resistance - 1 x daily - 5 x weekly - 2 sets - 10 reps Seated March - 1 x daily - 5 x weekly - 3 sets - 5 reps Seated Hamstring Stretch - 1 x daily - 5 x weekly - 4 sets - 20-30 seconds hold Seated Hamstring Set - 1 x daily - 5 x weekly - 10 reps - 10 second hold

## 2020-04-29 ENCOUNTER — Ambulatory Visit: Payer: Medicare Other

## 2020-05-01 ENCOUNTER — Ambulatory Visit: Payer: Medicare Other

## 2020-05-01 ENCOUNTER — Other Ambulatory Visit: Payer: Self-pay

## 2020-05-01 DIAGNOSIS — M62838 Other muscle spasm: Secondary | ICD-10-CM

## 2020-05-01 DIAGNOSIS — M25562 Pain in left knee: Secondary | ICD-10-CM | POA: Diagnosis not present

## 2020-05-01 DIAGNOSIS — M6281 Muscle weakness (generalized): Secondary | ICD-10-CM

## 2020-05-01 DIAGNOSIS — M25662 Stiffness of left knee, not elsewhere classified: Secondary | ICD-10-CM

## 2020-05-01 DIAGNOSIS — R262 Difficulty in walking, not elsewhere classified: Secondary | ICD-10-CM

## 2020-05-01 DIAGNOSIS — M25552 Pain in left hip: Secondary | ICD-10-CM

## 2020-05-01 DIAGNOSIS — M25571 Pain in right ankle and joints of right foot: Secondary | ICD-10-CM

## 2020-05-01 NOTE — Therapy (Signed)
[place]. [place], [place], [address] Phone: [phone]   Fax:  [phone]  Physical Therapy Treatment  Patient Details  Name: Mahari Vankirk MRN: [id number] Date of Birth: [birth year] Referring Provider (PT): Rip Harbour   Encounter Date: 05/01/2020   PT End of Session - 05/01/20 0859    Visit Number 2    Number of Visits 17    Date for PT Re-Evaluation 06/17/20    Authorization Type UHC medicare    PT Start Time [phone]    PT Stop Time 0900    PT Time Calculation (min) 48 min    Activity Tolerance Patient tolerated treatment well;Patient limited by pain    Behavior During Therapy [facility] for tasks assessed/performed           Past Medical History:  Diagnosis Date  . Asthma   . Atrial fibrillation ([place])   . DDD (degenerative disc disease), lumbar   . Diabetes mellitus   . HLD (hyperlipidemia)   . Hypertension   . Obesity   . Sleep apnea     Past Surgical History:  Procedure Laterality Date  . ABDOMINAL HYSTERECTOMY    . CYSTOSCOPY  02/29/2012   Procedure: CYSTOSCOPY;  Surgeon: Cheri Fowler, MD;  Location: [facility];  Service: Gynecology;  Laterality: N/A;  . LAPAROSCOPIC ASSISTED VAGINAL HYSTERECTOMY  02/29/2012   Procedure: LAPAROSCOPIC ASSISTED VAGINAL HYSTERECTOMY;  Surgeon: Cheri Fowler, MD;  Location: [facility];  Service: Gynecology;  Laterality: N/A;  . SALPINGOOPHORECTOMY  02/29/2012   Procedure: SALPINGO OOPHORECTOMY;  Surgeon: Cheri Fowler, MD;  Location: [facility];  Service: Gynecology;  Laterality: Bilateral;  . uterine ablation    . VEIN SURGERY      There were no vitals filed for this visit.   Subjective Assessment - 05/01/20 0816    Subjective Pt reports she had fluid removed from the left knee last thursday or friday, and says she was given injection in the left knee. "Didnt do much of the exercises because I was weak" having blood sugar issues    Pertinent History uncontrolled T2DM,  HTN, history of Afib, DJD, Asthma treated by inhaler. MRI L Knee 04/21/20: No meniscal or ligamentous injury of the left knee. Partial-thickness cartilage loss of the medial trochlea and medial femorotibial compartment.  Moderate joint effusion.  Small leaking Baker's cyst.    Limitations Lifting;Standing;Walking;House hold activities    How long can you sit comfortably? depends on how sitting - needs to be able to stretch out leg, no longer than 5 minutes with knee bent.    How long can you stand comfortably? <1 minute before exacerbation of pain feels like it gives out.    How long can you walk comfortably? < 1 minute, able to walk with increased pain and cane.    Diagnostic tests MRI 04/21/20    Patient Stated Goals have less pain and walk better    Currently in Pain? Yes    Pain Score 8     Pain Location Knee    Pain Orientation Left;Medial    Pain Descriptors / Indicators Sharp;Sore    Pain Type Acute pain    Pain Score 7    Pain Location Ankle    Pain Orientation Right;Lateral                             [facility] Adult PT Treatment/Exercise - 05/01/20 0001  Exercises   Exercises Knee/Hip;Ankle      Knee/Hip Exercises: Aerobic   Nustep L1 x 4 min, L3 x 2 min   3 minute rest in between     Knee/Hip Exercises: Seated   Long Arc Quad Left;2 sets;10 reps    Heel Slides Both;2 sets;10 reps    Ball Squeeze 2x10 3 sec    Clamshell with TheraBand Red   10 x 2   Marching AROM;Strengthening;Both;3 sets;5 reps    Hamstring Curl --      Modalities   Modalities Cryotherapy;Electrical Stimulation      Cryotherapy   Number Minutes Cryotherapy 10 Minutes    Cryotherapy Location --   L knee, R ankle   Type of Cryotherapy Ice pack      Electrical Stimulation   Electrical Stimulation Location R lateral ankle    Electrical Stimulation Action Premod    Electrical Stimulation Goals Pain      Ankle Exercises: Seated   Towel Inversion/Eversion --   10 reps   Heel Raises  10 reps;Right   2 sets   Toe Raise 10 reps   2 sets                   PT Short Term Goals - 04/22/20 1550      PT SHORT TERM GOAL #1   Title independent with initial HEP    Time 2    Period Weeks    Status New    Target Date 05/06/20             PT Long Term Goals - 04/22/20 1550      PT LONG TERM GOAL #1   Title Pt will have improved L Knee flexion ROM to at least 125 deg and </= 3/10 pain for improved functional mobility with gait and transfers    Time 8    Period Weeks    Status New    Target Date 06/17/20      PT LONG TERM GOAL #2   Title decrease pain to </= 3/10 with ADLs    Time 8    Period Weeks    Status New    Target Date 06/17/20      PT LONG TERM GOAL #3   Title Pt will be able to walk without limping and with improved symmetry of steps and stance, </= 3/10 pain.    Time 8    Period Weeks    Status New    Target Date 06/17/20      PT LONG TERM GOAL #4   Title BLE strength to at least 4/5    Time 8    Period Weeks    Status New    Target Date 06/17/20                 Plan - 05/01/20 0902    Clinical Impression Statement Pt tolerated treatment fairly today. Most pain in right ankle today. Pt reported poor carryover of home exercises so exercises were reviewed today and pt was educated on ways to pace exercises as needed throughout the week, and about the importance of consistency with the exercises for improvements in strength. Session concluded with ice and estim with improvement in pain.    Personal Factors and Comorbidities Comorbidity 3+;Past/Current Experience    Comorbidities uncontrolled T2DM, HTN, history of Afib, DJD, Asthma treated by inhaler.    Rehab Potential Good    PT Frequency 2x / week    PT  Duration 8 weeks    PT Treatment/Interventions ADLs/Self Care Home Management;Cryotherapy;Electrical Stimulation;Iontophoresis 4mg /ml Dexamethasone;Moist Heat;Gait training;Neuromuscular re-education;Balance training;Therapeutic  exercise;Therapeutic activities;Functional mobility training;Stair training;Patient/family education;Manual techniques    PT Next Visit Plan Continue to assess HEP. manual and modalities as needed. progress TE for LE strengthening, knee stability activities. Pain and instability  with weightbearing so progress any standing exercises based on tolerance.    PT Home Exercise Plan Access Code: [email or web address]. Seated LE strengthening exercises and HS stretch    Consulted and Agree with Plan of Care Patient           Patient will benefit from skilled therapeutic intervention in order to improve the following deficits and impairments:  Abnormal gait,Decreased range of motion,Difficulty walking,Decreased endurance,Decreased activity tolerance,Pain,Impaired flexibility,Improper body mechanics,Decreased mobility,Decreased strength,Increased muscle spasms,Decreased balance  Visit Diagnosis: Acute pain of left knee  Pain in left hip  Difficulty in walking, not elsewhere classified  Pain in right ankle and joints of right foot  Muscle weakness (generalized)  Other muscle spasm  Stiffness of left knee, not elsewhere classified     Problem List Patient Active Problem List   Diagnosis Date Noted  . Weakness 04/08/2015  . Diabetes mellitus without complication ([place]) 88/32/5498  . Anxiety 04/07/2015  . Left-sided weakness 04/07/2015  . Hypertension   . Asthma   . DDD (degenerative disc disease), lumbar   . Obesity   . HLD (hyperlipidemia)   . Hypothyroidism   . Essential hypertension   . Atrial fibrillation ([place])   . Fibroids 03/01/2012  . ALLERGIC RHINITIS 07/05/2006  . LOW BACK PAIN, CHRONIC 07/05/2006  . BACK PAIN 07/05/2006  . DYSURIA 07/05/2006    Hall Busing, PT, DPT 05/01/2020, 9:20 AM  [place]. [place], [place], [address] Phone: [phone]   Fax:  [phone]  Name: Barney Gertsch MRN:  [id number] Date of Birth: 19-Jul-1965

## 2020-05-04 ENCOUNTER — Ambulatory Visit: Payer: Medicare Other

## 2020-05-04 ENCOUNTER — Other Ambulatory Visit: Payer: Self-pay

## 2020-05-04 DIAGNOSIS — M25562 Pain in left knee: Secondary | ICD-10-CM

## 2020-05-04 DIAGNOSIS — R262 Difficulty in walking, not elsewhere classified: Secondary | ICD-10-CM

## 2020-05-04 DIAGNOSIS — M62838 Other muscle spasm: Secondary | ICD-10-CM

## 2020-05-04 DIAGNOSIS — M25662 Stiffness of left knee, not elsewhere classified: Secondary | ICD-10-CM

## 2020-05-04 DIAGNOSIS — M25571 Pain in right ankle and joints of right foot: Secondary | ICD-10-CM

## 2020-05-04 DIAGNOSIS — M6281 Muscle weakness (generalized): Secondary | ICD-10-CM

## 2020-05-04 DIAGNOSIS — M25552 Pain in left hip: Secondary | ICD-10-CM

## 2020-05-04 NOTE — Therapy (Signed)
[place]. [place], [place], [address] Phone: [phone]   Fax:  [phone]  Physical Therapy Treatment  Patient Details  Name: Pacey Willadsen MRN: [id number] Date of Birth: Mar 10, 1966 Referring Provider (PT): Rip Harbour   Encounter Date: 05/04/2020   PT End of Session - 05/04/20 0951    Visit Number 3    Number of Visits 17    Date for PT Re-Evaluation 06/17/20    Authorization Type UHC medicare    PT Start Time 0800    PT Stop Time 0845    PT Time Calculation (min) 45 min    Activity Tolerance Patient tolerated treatment well;Patient limited by pain    Behavior During Therapy [facility] for tasks assessed/performed           Past Medical History:  Diagnosis Date  . Asthma   . Atrial fibrillation ([place])   . DDD (degenerative disc disease), lumbar   . Diabetes mellitus   . HLD (hyperlipidemia)   . Hypertension   . Obesity   . Sleep apnea     Past Surgical History:  Procedure Laterality Date  . ABDOMINAL HYSTERECTOMY    . CYSTOSCOPY  02/29/2012   Procedure: CYSTOSCOPY;  Surgeon: Cheri Fowler, MD;  Location: [facility];  Service: Gynecology;  Laterality: N/A;  . LAPAROSCOPIC ASSISTED VAGINAL HYSTERECTOMY  02/29/2012   Procedure: LAPAROSCOPIC ASSISTED VAGINAL HYSTERECTOMY;  Surgeon: Cheri Fowler, MD;  Location: [facility];  Service: Gynecology;  Laterality: N/A;  . SALPINGOOPHORECTOMY  02/29/2012   Procedure: SALPINGO OOPHORECTOMY;  Surgeon: Cheri Fowler, MD;  Location: [facility];  Service: Gynecology;  Laterality: Bilateral;  . uterine ablation    . VEIN SURGERY      There were no vitals filed for this visit.   Subjective Assessment - 05/04/20 0803    Subjective Doing alright today. Started a new med giving insomnia - up all night (reports starting it the 15th). Pt also reports having gout in the past, wondering if that came back and is adding to pain    Pertinent History uncontrolled T2DM, HTN, history of  Afib, DJD, Asthma treated by inhaler. MRI L Knee 04/21/20: No meniscal or ligamentous injury of the left knee. Partial-thickness cartilage loss of the medial trochlea and medial femorotibial compartment.  Moderate joint effusion.  Small leaking Baker's cyst.    Limitations Lifting;Standing;Walking;House hold activities    How long can you sit comfortably? depends on how sitting - needs to be able to stretch out leg, no longer than 5 minutes with knee bent.    How long can you stand comfortably? <1 minute before exacerbation of pain feels like it gives out.    How long can you walk comfortably? < 1 minute, able to walk with increased pain and cane.    Diagnostic tests MRI 04/21/20    Patient Stated Goals have less pain and walk better    Currently in Pain? Yes    Pain Score 5     Pain Location Knee    Pain Orientation Left;Medial    Pain Descriptors / Indicators Sore;Sharp    Pain Type Acute pain    Pain Onset 1 to 4 weeks ago    Pain Score 5    Pain Location Ankle    Pain Orientation Right;Lateral    Pain Descriptors / Indicators Sharp;Shooting;Sore    Pain Type Acute pain  [facility] Adult PT Treatment/Exercise - 05/04/20 0805      Knee/Hip Exercises: Aerobic   Nustep L1 x 6 min      Knee/Hip Exercises: Seated   Long Arc Quad Both;2 sets;10 reps    Ball Squeeze 2x10 3 sec    Clamshell with TheraBand Red   x10   Other Seated Knee/Hip Exercises Left: Quad Sets x 10, HS sets x 5. 5" holds    Marching Both;2 sets;10 reps      Manual Therapy   Manual Therapy Taping;Soft tissue mobilization    Manual therapy comments KTape LEFT anterior  knee stability taping    Soft tissue mobilization manual PROM Right ankle, gastrosol stretching, gentle ankle distraction with DF      Ankle Exercises: Seated   Towel Inversion/Eversion --   Right, x 10   Heel Raises 10 reps;Right    Toe Raise 10 reps                  PT Education - 05/04/20 0806     Education Details Reviewed HEP again. Educated on supported sleep positions using pillows to improve comfort. Educated on indications for application of Ktape and on  indications and methods for safe removal of KTape   Person(s) Educated Patient    Methods Explanation;Demonstration    Comprehension Verbalized understanding;Returned demonstration            PT Short Term Goals - 04/22/20 1550      PT SHORT TERM GOAL #1   Title independent with initial HEP    Time 2    Period Weeks    Status New    Target Date 05/06/20             PT Long Term Goals - 04/22/20 1550      PT LONG TERM GOAL #1   Title Pt will have improved L Knee flexion ROM to at least 125 deg and </= 3/10 pain for improved functional mobility with gait and transfers    Time 8    Period Weeks    Status New    Target Date 06/17/20      PT LONG TERM GOAL #2   Title decrease pain to </= 3/10 with ADLs    Time 8    Period Weeks    Status New    Target Date 06/17/20      PT LONG TERM GOAL #3   Title Pt will be able to walk without limping and with improved symmetry of steps and stance, </= 3/10 pain.    Time 8    Period Weeks    Status New    Target Date 06/17/20      PT LONG TERM GOAL #4   Title BLE strength to at least 4/5    Time 8    Period Weeks    Status New    Target Date 06/17/20                 Plan - 05/04/20 0808    Clinical Impression Statement Pt very lethargic throughout session today, taking multiple rest breaks during and between exercises. She expressed difficulty with sleep, insomnia with having a hard time finding comfortable position and she was educated in use of pillows, folded blankets to use as support when sleeping on her side (preferred position). Continued with focus on seated strengthening and stability exercises, adding quad ans HS isometrics today with good performance but only able to tolerate limited reps today.  Left knee pain and instability continues and  disussed with pt potential benefit of kinesiotaping as a manual technique used to help provide some neuromuscular feedback to assist with stability. Pt agreeable to trial of ktape today reporting o history of allergy or sensitivity to [employer]. Skin intact and WNL pre during and post application. Pt educated extensively in monitoring for skin tolerance and educated in use of an oil medium to saturate tape to remove if needed. Pt able to repeat indications and method for safe tape removal    Personal Factors and Comorbidities Comorbidity 3+;Past/Current Experience    Comorbidities uncontrolled T2DM, HTN, history of Afib, DJD, Asthma treated by inhaler.    Rehab Potential Good    PT Frequency 2x / week    PT Duration 8 weeks    PT Treatment/Interventions ADLs/Self Care Home Management;Cryotherapy;Electrical Stimulation;Iontophoresis 4mg /ml Dexamethasone;Moist Heat;Gait training;Neuromuscular re-education;Balance training;Therapeutic exercise;Therapeutic activities;Functional mobility training;Stair training;Patient/family education;Manual techniques    PT Next Visit Plan Continue to assess HEP, Assess tolerance to ktape next visit.  manual and modalities as needed. progress TE for LE strengthening, knee stability activities. Pain and instability  with weightbearing so progress any standing exercises based on tolerance.    PT Home Exercise Plan Access Code: [email or web address]. Seated LE strengthening exercises and HS stretch    Consulted and Agree with Plan of Care Patient           Patient will benefit from skilled therapeutic intervention in order to improve the following deficits and impairments:  Abnormal gait,Decreased range of motion,Difficulty walking,Decreased endurance,Decreased activity tolerance,Pain,Impaired flexibility,Improper body mechanics,Decreased mobility,Decreased strength,Increased muscle spasms,Decreased balance  Visit Diagnosis: Acute pain of left knee  Pain in left hip  Difficulty  in walking, not elsewhere classified  Pain in right ankle and joints of right foot  Muscle weakness (generalized)  Other muscle spasm  Stiffness of left knee, not elsewhere classified     Problem List Patient Active Problem List   Diagnosis Date Noted  . Weakness 04/08/2015  . Diabetes mellitus without complication ([place]) 86/76/7209  . Anxiety 04/07/2015  . Left-sided weakness 04/07/2015  . Hypertension   . Asthma   . DDD (degenerative disc disease), lumbar   . Obesity   . HLD (hyperlipidemia)   . Hypothyroidism   . Essential hypertension   . Atrial fibrillation ([place])   . Fibroids 03/01/2012  . ALLERGIC RHINITIS 07/05/2006  . LOW BACK PAIN, CHRONIC 07/05/2006  . BACK PAIN 07/05/2006  . DYSURIA 07/05/2006    Hall Busing, PT, DPT 05/04/2020, 9:59 AM  [place]. [place], [place], [address] Phone: [phone]   Fax:  [phone]  Name: Zooey Schreurs MRN: [id number] Date of Birth: 1965/07/02

## 2020-05-06 ENCOUNTER — Ambulatory Visit: Payer: Medicare Other

## 2020-05-11 ENCOUNTER — Ambulatory Visit: Payer: Medicare Other

## 2020-05-11 ENCOUNTER — Other Ambulatory Visit: Payer: Self-pay

## 2020-05-11 DIAGNOSIS — M25562 Pain in left knee: Secondary | ICD-10-CM

## 2020-05-11 DIAGNOSIS — M6281 Muscle weakness (generalized): Secondary | ICD-10-CM

## 2020-05-11 DIAGNOSIS — R262 Difficulty in walking, not elsewhere classified: Secondary | ICD-10-CM

## 2020-05-11 DIAGNOSIS — M25552 Pain in left hip: Secondary | ICD-10-CM

## 2020-05-11 DIAGNOSIS — M25571 Pain in right ankle and joints of right foot: Secondary | ICD-10-CM

## 2020-05-11 DIAGNOSIS — M62838 Other muscle spasm: Secondary | ICD-10-CM

## 2020-05-11 DIAGNOSIS — M25662 Stiffness of left knee, not elsewhere classified: Secondary | ICD-10-CM

## 2020-05-11 NOTE — Therapy (Signed)
[place]. [place], [place], [address] Phone: [phone]   Fax:  [phone]  Physical Therapy Treatment  Patient Details  Name: [place] MRN: [id number] Date of Birth: 05/15/1965 Referring Provider (PT): Rip Harbour   Encounter Date: 05/11/2020   PT End of Session - 05/11/20 0814    Visit Number 4    Number of Visits 17    Date for PT Re-Evaluation 06/17/20    Authorization Type UHC medicare    PT Start Time 0800    PT Stop Time 0840    PT Time Calculation (min) 40 min    Activity Tolerance Patient tolerated treatment well;Patient limited by pain    Behavior During Therapy [facility] for tasks assessed/performed           Past Medical History:  Diagnosis Date  . Asthma   . Atrial fibrillation ([place])   . DDD (degenerative disc disease), lumbar   . Diabetes mellitus   . HLD (hyperlipidemia)   . Hypertension   . Obesity   . Sleep apnea     Past Surgical History:  Procedure Laterality Date  . ABDOMINAL HYSTERECTOMY    . CYSTOSCOPY  02/29/2012   Procedure: CYSTOSCOPY;  Surgeon: Cheri Fowler, MD;  Location: [facility];  Service: Gynecology;  Laterality: N/A;  . LAPAROSCOPIC ASSISTED VAGINAL HYSTERECTOMY  02/29/2012   Procedure: LAPAROSCOPIC ASSISTED VAGINAL HYSTERECTOMY;  Surgeon: Cheri Fowler, MD;  Location: [facility];  Service: Gynecology;  Laterality: N/A;  . SALPINGOOPHORECTOMY  02/29/2012   Procedure: SALPINGO OOPHORECTOMY;  Surgeon: Cheri Fowler, MD;  Location: [facility];  Service: Gynecology;  Laterality: Bilateral;  . uterine ablation    . VEIN SURGERY      There were no vitals filed for this visit.   Subjective Assessment - 05/11/20 0802    Subjective Feeling pretty good today, not as much pain. Tape did not stay on too long after last session but did feel a little better. L knee and R ankle pain 3/10 today. Reports having some difficulty with blood pressure    Pertinent History uncontrolled  T2DM, HTN, history of Afib, DJD, Asthma treated by inhaler. MRI L Knee 04/21/20: No meniscal or ligamentous injury of the left knee. Partial-thickness cartilage loss of the medial trochlea and medial femorotibial compartment.  Moderate joint effusion.  Small leaking Baker's cyst.    Limitations Lifting;Standing;Walking;House hold activities    How long can you sit comfortably? depends on how sitting - needs to be able to stretch out leg, no longer than 5 minutes with knee bent.    How long can you stand comfortably? <1 minute before exacerbation of pain feels like it gives out.    How long can you walk comfortably? < 1 minute, able to walk with increased pain and cane.    Diagnostic tests MRI 04/21/20    Patient Stated Goals have less pain and walk better    Currently in Pain? Yes    Pain Score 3                              [facility] Adult PT Treatment/Exercise - 05/11/20 0805      Knee/Hip Exercises: Seated   Long Arc Quad Both;2 sets;10 reps    Long Arc Quad Weight 2 lbs.    Ball Squeeze 3x10 3 sec    Clamshell with TheraBand Red   10 x 2   Other Seated  Knee/Hip Exercises Quad Sets x 10, HS sets x 10. 5" holds      Knee/Hip Exercises: Supine   Short Arc Quad Sets Both;2 sets;5 reps;Strengthening      Manual Therapy   Manual Therapy Taping;Soft tissue mobilization    Manual therapy comments KTape LEFT anterior  knee stability with patellar tendon relief/inhibition, posterior lateral HS inhibition/relief taping    Soft tissue mobilization manual PROM Right ankle, gastrosol stretching, gentle ankle distraction with DF      Ankle Exercises: Seated   Towel Inversion/Eversion --   x10   Heel Raises 20 reps;Right    Toe Raise 20 reps                    PT Short Term Goals - 04/22/20 1550      PT SHORT TERM GOAL #1   Title independent with initial HEP    Time 2    Period Weeks    Status New    Target Date 05/06/20             PT Long Term Goals -  04/22/20 1550      PT LONG TERM GOAL #1   Title Pt will have improved L Knee flexion ROM to at least 125 deg and </= 3/10 pain for improved functional mobility with gait and transfers    Time 8    Period Weeks    Status New    Target Date 06/17/20      PT LONG TERM GOAL #2   Title decrease pain to </= 3/10 with ADLs    Time 8    Period Weeks    Status New    Target Date 06/17/20      PT LONG TERM GOAL #3   Title Pt will be able to walk without limping and with improved symmetry of steps and stance, </= 3/10 pain.    Time 8    Period Weeks    Status New    Target Date 06/17/20      PT LONG TERM GOAL #4   Title BLE strength to at least 4/5    Time 8    Period Weeks    Status New    Target Date 06/17/20                 Plan - 05/11/20 0845    Clinical Impression Statement Pt reports pain overall better at ankle and knee, however L knee pain exacerbated with knee straightening exercises today. Unable to tolerate standing TKE, able to tolerate more SAQs but continued pain exacerbated. Tolerated tape from last session well. tape reapplied today for anterior stability and posterior lateral thigh/HS relief to decrease back of knee pain with good relief of pain reported post. Skin intact pre, during and post application.    Personal Factors and Comorbidities Comorbidity 3+;Past/Current Experience    Rehab Potential Good    PT Frequency 2x / week    PT Duration 8 weeks    PT Treatment/Interventions ADLs/Self Care Home Management;Cryotherapy;Electrical Stimulation;Iontophoresis 4mg /ml Dexamethasone;Moist Heat;Gait training;Neuromuscular re-education;Balance training;Therapeutic exercise;Therapeutic activities;Functional mobility training;Stair training;Patient/family education;Manual techniques    PT Next Visit Plan Continue to assess HEP, Assess tolerance to ktape next visit.  manual and modalities as needed. progress TE for LE strengthening, knee stability activities. Pain and  instability  with weightbearing so progress any standing exercises based on tolerance.    PT Home Exercise Plan Access Code: [email or web address]. Seated LE strengthening exercises and HS stretch  Consulted and Agree with Plan of Care Patient           Patient will benefit from skilled therapeutic intervention in order to improve the following deficits and impairments:  Abnormal gait,Decreased range of motion,Difficulty walking,Decreased endurance,Decreased activity tolerance,Pain,Impaired flexibility,Improper body mechanics,Decreased mobility,Decreased strength,Increased muscle spasms,Decreased balance  Visit Diagnosis: Acute pain of left knee  Pain in left hip  Difficulty in walking, not elsewhere classified  Pain in right ankle and joints of right foot  Muscle weakness (generalized)  Other muscle spasm  Stiffness of left knee, not elsewhere classified     Problem List Patient Active Problem List   Diagnosis Date Noted  . Weakness 04/08/2015  . Diabetes mellitus without complication ([place]) 32/02/2481  . Anxiety 04/07/2015  . Left-sided weakness 04/07/2015  . Hypertension   . Asthma   . DDD (degenerative disc disease), lumbar   . Obesity   . HLD (hyperlipidemia)   . Hypothyroidism   . Essential hypertension   . Atrial fibrillation ([place])   . Fibroids 03/01/2012  . ALLERGIC RHINITIS 07/05/2006  . LOW BACK PAIN, CHRONIC 07/05/2006  . BACK PAIN 07/05/2006  . DYSURIA 07/05/2006    Hall Busing, PT DPT 05/11/2020, 12:27 PM  [place]. [place], [place], [address] Phone: [phone]   Fax:  [phone]  Name: Montoya Watkin MRN: [id number] Date of Birth: 1965/03/27

## 2020-05-14 ENCOUNTER — Other Ambulatory Visit: Payer: Self-pay

## 2020-05-14 ENCOUNTER — Ambulatory Visit: Payer: Medicare Other | Attending: Family Medicine

## 2020-05-14 DIAGNOSIS — M25552 Pain in left hip: Secondary | ICD-10-CM | POA: Diagnosis present

## 2020-05-14 DIAGNOSIS — R262 Difficulty in walking, not elsewhere classified: Secondary | ICD-10-CM | POA: Diagnosis present

## 2020-05-14 DIAGNOSIS — M25562 Pain in left knee: Secondary | ICD-10-CM

## 2020-05-14 DIAGNOSIS — M62838 Other muscle spasm: Secondary | ICD-10-CM | POA: Diagnosis present

## 2020-05-14 DIAGNOSIS — M25571 Pain in right ankle and joints of right foot: Secondary | ICD-10-CM | POA: Diagnosis present

## 2020-05-14 DIAGNOSIS — M6281 Muscle weakness (generalized): Secondary | ICD-10-CM

## 2020-05-14 DIAGNOSIS — M25662 Stiffness of left knee, not elsewhere classified: Secondary | ICD-10-CM | POA: Diagnosis present

## 2020-05-14 NOTE — Therapy (Signed)
[place]. [place], [place], [address] Phone: [phone]   Fax:  [phone]  Physical Therapy Treatment  Patient Details  Name: [place] Quinn MRN: [id number] Date of Birth: 1965-09-14 Referring Provider (PT): Rip Harbour   Encounter Date: 05/14/2020   PT End of Session - 05/14/20 0903    Visit Number 5    Number of Visits 17    Date for PT Re-Evaluation 06/17/20    Authorization Type UHC medicare    PT Start Time 0848    PT Stop Time 0930    PT Time Calculation (min) 42 min    Activity Tolerance Patient tolerated treatment well;Patient limited by pain    Behavior During Therapy [facility] for tasks assessed/performed           Past Medical History:  Diagnosis Date  . Asthma   . Atrial fibrillation ([place])   . DDD (degenerative disc disease), lumbar   . Diabetes mellitus   . HLD (hyperlipidemia)   . Hypertension   . Obesity   . Sleep apnea     Past Surgical History:  Procedure Laterality Date  . ABDOMINAL HYSTERECTOMY    . CYSTOSCOPY  02/29/2012   Procedure: CYSTOSCOPY;  Surgeon: Cheri Fowler, MD;  Location: [facility];  Service: Gynecology;  Laterality: N/A;  . LAPAROSCOPIC ASSISTED VAGINAL HYSTERECTOMY  02/29/2012   Procedure: LAPAROSCOPIC ASSISTED VAGINAL HYSTERECTOMY;  Surgeon: Cheri Fowler, MD;  Location: [facility];  Service: Gynecology;  Laterality: N/A;  . SALPINGOOPHORECTOMY  02/29/2012   Procedure: SALPINGO OOPHORECTOMY;  Surgeon: Cheri Fowler, MD;  Location: [facility];  Service: Gynecology;  Laterality: Bilateral;  . uterine ablation    . VEIN SURGERY      There were no vitals filed for this visit.   Subjective Assessment - 05/14/20 0852    Subjective Feeling pretty good today, not as much pain. Felt a bit better with taping last session. L knee 2/10 and R ankle pain 0/10 today.    Pertinent History uncontrolled T2DM, HTN, history of Afib, DJD, Asthma treated by inhaler. MRI L Knee 04/21/20: No  meniscal or ligamentous injury of the left knee. Partial-thickness cartilage loss of the medial trochlea and medial femorotibial compartment.  Moderate joint effusion.  Small leaking Baker's cyst.                             [facility] Adult PT Treatment/Exercise - 05/14/20 0001      Exercises   Exercises Knee/Hip;Ankle      Knee/Hip Exercises: Aerobic   Nustep L2 x 4 minutes      Knee/Hip Exercises: Seated   Long Arc Quad Both;2 sets;10 reps    Long Arc Quad Weight 2 lbs.    Ball Squeeze 2x10 3 sec    Other Seated Knee/Hip Exercises Quad Sets x 10, HS sets x 10. 5" holds    Marching Both;2 sets;10 reps    Marching Weights 2 lbs.      Knee/Hip Exercises: Supine   Heel Slides AROM;Strengthening;Left;10 reps    Straight Leg Raises AAROM;Strengthening;Left;5 reps   Manual Assist by PT   Patellar Mobs medial/lateral glides      Ankle Exercises: Seated   Heel Raises 20 reps;Right    Toe Raise 20 reps          Cybex knee flexion from 60 deg flexion 20# x 10, from 45 deg 15# x 10  PT Short Term Goals - 04/22/20 1550      PT SHORT TERM GOAL #1   Title independent with initial HEP    Time 2    Period Weeks    Status New    Target Date 05/06/20             PT Long Term Goals - 04/22/20 1550      PT LONG TERM GOAL #1   Title Pt will have improved L Knee flexion ROM to at least 125 deg and </= 3/10 pain for improved functional mobility with gait and transfers    Time 8    Period Weeks    Status New    Target Date 06/17/20      PT LONG TERM GOAL #2   Title decrease pain to </= 3/10 with ADLs    Time 8    Period Weeks    Status New    Target Date 06/17/20      PT LONG TERM GOAL #3   Title Pt will be able to walk without limping and with improved symmetry of steps and stance, </= 3/10 pain.    Time 8    Period Weeks    Status New    Target Date 06/17/20      PT LONG TERM GOAL #4   Title BLE strength to at least 4/5    Time 8     Period Weeks    Status New    Target Date 06/17/20                 Plan - 05/14/20 0904    Clinical Impression Statement Pt with continued improvement in knee and ankle pain. Less pain exacerbated with knee straightening. Was able to tolerated seated knee flexion and extension exercises with resitance well today. Most limited by back pain and discomfort today but otherwise did nicely.    Personal Factors and Comorbidities Comorbidity 3+;Past/Current Experience    Comorbidities uncontrolled T2DM, HTN, history of Afib, DJD, Asthma treated by inhaler.    PT Treatment/Interventions ADLs/Self Care Home Management;Cryotherapy;Electrical Stimulation;Iontophoresis 4mg /ml Dexamethasone;Moist Heat;Gait training;Neuromuscular re-education;Balance training;Therapeutic exercise;Therapeutic activities;Functional mobility training;Stair training;Patient/family education;Manual techniques    PT Next Visit Plan Continue to assess HEP,   manual and modalities as needed. progress TE for LE strengthening, knee stability activities. Pain and instability  with weightbearing so progress any standing exercises based on tolerance.    PT Home Exercise Plan Access Code: [email or web address]. Seated LE strengthening exercises and HS stretch           Patient will benefit from skilled therapeutic intervention in order to improve the following deficits and impairments:  Abnormal gait,Decreased range of motion,Difficulty walking,Decreased endurance,Decreased activity tolerance,Pain,Impaired flexibility,Improper body mechanics,Decreased mobility,Decreased strength,Increased muscle spasms,Decreased balance  Visit Diagnosis: Acute pain of left knee  Pain in left hip  Difficulty in walking, not elsewhere classified  Pain in right ankle and joints of right foot  Muscle weakness (generalized)  Other muscle spasm  Stiffness of left knee, not elsewhere classified     Problem List Patient Active Problem List   Diagnosis  Date Noted  . Weakness 04/08/2015  . Diabetes mellitus without complication ([place]) 44/96/7591  . Anxiety 04/07/2015  . Left-sided weakness 04/07/2015  . Hypertension   . Asthma   . DDD (degenerative disc disease), lumbar   . Obesity   . HLD (hyperlipidemia)   . Hypothyroidism   . Essential hypertension   . Atrial fibrillation ([place])   . Fibroids  03/01/2012  . ALLERGIC RHINITIS 07/05/2006  . LOW BACK PAIN, CHRONIC 07/05/2006  . BACK PAIN 07/05/2006  . DYSURIA 07/05/2006    Hall Busing, PT, DPT 05/14/2020, 9:30 AM  [place]. [place], [place], [address] Phone: [phone]   Fax:  [phone]  Name: [place] Quinn MRN: [id number] Date of Birth: 04-25-1965

## 2020-05-18 ENCOUNTER — Ambulatory Visit: Payer: Medicare Other

## 2020-05-20 ENCOUNTER — Ambulatory Visit: Payer: Medicare Other

## 2020-05-25 ENCOUNTER — Other Ambulatory Visit: Payer: Self-pay

## 2020-05-25 ENCOUNTER — Ambulatory Visit: Payer: Medicare Other

## 2020-05-25 DIAGNOSIS — M25562 Pain in left knee: Secondary | ICD-10-CM

## 2020-05-25 DIAGNOSIS — M6281 Muscle weakness (generalized): Secondary | ICD-10-CM

## 2020-05-25 DIAGNOSIS — M25552 Pain in left hip: Secondary | ICD-10-CM

## 2020-05-25 DIAGNOSIS — M25662 Stiffness of left knee, not elsewhere classified: Secondary | ICD-10-CM

## 2020-05-25 DIAGNOSIS — M25571 Pain in right ankle and joints of right foot: Secondary | ICD-10-CM

## 2020-05-25 DIAGNOSIS — M62838 Other muscle spasm: Secondary | ICD-10-CM

## 2020-05-25 DIAGNOSIS — R262 Difficulty in walking, not elsewhere classified: Secondary | ICD-10-CM

## 2020-05-25 NOTE — Therapy (Signed)
[place] [place], [place], [address] Phone: [phone]   Fax:  [phone]  Physical Therapy Treatment  Patient Details  Name: Tonya Quinn MRN: [id number] Date of Birth: May 19, 1965 Referring Provider (PT): Rip Harbour   Encounter Date: 05/25/2020   PT End of Session - 05/25/20 0852    Visit Number 6    Number of Visits 17    Date for PT Re-Evaluation 06/17/20    Authorization Type UHC medicare    PT Start Time 0846    PT Stop Time 0926    PT Time Calculation (min) 40 min    Activity Tolerance Patient tolerated treatment well;Patient limited by pain    Behavior During Therapy [facility] for tasks assessed/performed           Past Medical History:  Diagnosis Date  . Asthma   . Atrial fibrillation ([place])   . DDD (degenerative disc disease), lumbar   . Diabetes mellitus   . HLD (hyperlipidemia)   . Hypertension   . Obesity   . Sleep apnea     Past Surgical History:  Procedure Laterality Date  . ABDOMINAL HYSTERECTOMY    . CYSTOSCOPY  02/29/2012   Procedure: CYSTOSCOPY;  Surgeon: Cheri Fowler, MD;  Location: [facility];  Service: Gynecology;  Laterality: N/A;  . LAPAROSCOPIC ASSISTED VAGINAL HYSTERECTOMY  02/29/2012   Procedure: LAPAROSCOPIC ASSISTED VAGINAL HYSTERECTOMY;  Surgeon: Cheri Fowler, MD;  Location: [facility];  Service: Gynecology;  Laterality: N/A;  . SALPINGOOPHORECTOMY  02/29/2012   Procedure: SALPINGO OOPHORECTOMY;  Surgeon: Cheri Fowler, MD;  Location: [facility];  Service: Gynecology;  Laterality: Bilateral;  . uterine ablation    . VEIN SURGERY      There were no vitals filed for this visit.   Subjective Assessment - 05/25/20 0848    Subjective pain a bit worse last few days. L knee 3/10 and R ankle pain 3/10 today. Rested all of saturday. "having trouble with neck this week"    Pertinent History uncontrolled T2DM, HTN, history of Afib, DJD, Asthma treated by inhaler. MRI L Knee 04/21/20: No  meniscal or ligamentous injury of the left knee. Partial-thickness cartilage loss of the medial trochlea and medial femorotibial compartment.  Moderate joint effusion.  Small leaking Baker's cyst.    Limitations Lifting;Standing;Walking;House hold activities    How long can you sit comfortably? depends on how sitting - needs to be able to stretch out leg, no longer than 5 minutes with knee bent.    How long can you stand comfortably? <1 minute before exacerbation of pain feels like it gives out.    How long can you walk comfortably? < 1 minute, able to walk with increased pain and cane.    Diagnostic tests MRI 04/21/20    Patient Stated Goals have less pain and walk better    Currently in Pain? Yes    Pain Score 3     Pain Location Knee    Pain Orientation Left;Medial    Pain Descriptors / Indicators Sore;Sharp    Pain Score 3    Pain Location Ankle                             [facility] Adult PT Treatment/Exercise - 05/25/20 0854      Knee/Hip Exercises: Aerobic   Nustep L3 x 5 minutes      Knee/Hip Exercises: Seated   Long Arc Quad Both;2 sets;10  reps    Long Arc Quad Weight 2 lbs.    Ball Squeeze 2 x 15    Other Seated Knee/Hip Exercises Quad Sets x 10, HS sets x 10. 5" holds    Marching Both;2 sets;10 reps    Marching Weights 2 lbs.    Hamstring Curl Both;2 sets;10 reps    Hamstring Limitations red tband      Knee/Hip Exercises: Supine   Patellar Mobs medial/lateral glides      Manual Therapy   Manual Therapy Taping;Soft tissue mobilization    Manual therapy comments KTape LEFT anterior  knee stability with patellar tendon relief/inhibition, posterior lateral HS inhibition/relief taping    Soft tissue mobilization manual PROM Right ankle, gastrosol stretching, gentle ankle distraction with DF                    PT Short Term Goals - 04/22/20 1550      PT SHORT TERM GOAL #1   Title independent with initial HEP    Time 2    Period Weeks    Status  New    Target Date 05/06/20             PT Long Term Goals - 04/22/20 1550      PT LONG TERM GOAL #1   Title Pt will have improved L Knee flexion ROM to at least 125 deg and </= 3/10 pain for improved functional mobility with gait and transfers    Time 8    Period Weeks    Status New    Target Date 06/17/20      PT LONG TERM GOAL #2   Title decrease pain to </= 3/10 with ADLs    Time 8    Period Weeks    Status New    Target Date 06/17/20      PT LONG TERM GOAL #3   Title Pt will be able to walk without limping and with improved symmetry of steps and stance, </= 3/10 pain.    Time 8    Period Weeks    Status New    Target Date 06/17/20      PT LONG TERM GOAL #4   Title BLE strength to at least 4/5    Time 8    Period Weeks    Status New    Target Date 06/17/20                 Plan - 05/25/20 0852    Clinical Impression Statement Pt with increased pain today. Required alot of cues for pacing on stepper and keeping up with exercises today, very  lethargic. Educated extensively on importance of keeping up with exercises at home for strengthening supporting muscles of knees/ankles. KTape reapplied today for pain mgmt and to assist in stability at the left knee with ADLs, exercses for home    Personal Factors and Comorbidities Comorbidity 3+;Past/Current Experience    Comorbidities uncontrolled T2DM, HTN, history of Afib, DJD, Asthma treated by inhaler.    Rehab Potential Good    PT Frequency 2x / week    PT Duration 8 weeks    PT Treatment/Interventions ADLs/Self Care Home Management;Cryotherapy;Electrical Stimulation;Iontophoresis 4mg /ml Dexamethasone;Moist Heat;Gait training;Neuromuscular re-education;Balance training;Therapeutic exercise;Therapeutic activities;Functional mobility training;Stair training;Patient/family education;Manual techniques    PT Next Visit Plan Continue to assess HEP,   manual and modalities as needed. progress TE for LE strengthening, knee  stability activities. Pain and instability  with weightbearing so progress any standing exercises based  on tolerance.    Consulted and Agree with Plan of Care Patient           Patient will benefit from skilled therapeutic intervention in order to improve the following deficits and impairments:  Abnormal gait,Decreased range of motion,Difficulty walking,Decreased endurance,Decreased activity tolerance,Pain,Impaired flexibility,Improper body mechanics,Decreased mobility,Decreased strength,Increased muscle spasms,Decreased balance  Visit Diagnosis: Acute pain of left knee  Pain in left hip  Difficulty in walking, not elsewhere classified  Pain in right ankle and joints of right foot  Muscle weakness (generalized)  Other muscle spasm  Stiffness of left knee, not elsewhere classified     Problem List Patient Active Problem List   Diagnosis Date Noted  . Weakness 04/08/2015  . Diabetes mellitus without complication ([place]) 01/05/8526  . Anxiety 04/07/2015  . Left-sided weakness 04/07/2015  . Hypertension   . Asthma   . DDD (degenerative disc disease), lumbar   . Obesity   . HLD (hyperlipidemia)   . Hypothyroidism   . Essential hypertension   . Atrial fibrillation ([place])   . Fibroids 03/01/2012  . ALLERGIC RHINITIS 07/05/2006  . LOW BACK PAIN, CHRONIC 07/05/2006  . BACK PAIN 07/05/2006  . DYSURIA 07/05/2006    Memory Heinrichs L Shuntel Fishburn 05/25/2020, 9:27 AM  [place]. [place], [place], [address] Phone: [phone]   Fax:  [phone]  Name: Tonya Quinn MRN: [id number] Date of Birth: [birth year]

## 2020-05-27 ENCOUNTER — Other Ambulatory Visit: Payer: Self-pay

## 2020-05-27 ENCOUNTER — Ambulatory Visit: Payer: Medicare Other

## 2020-05-27 DIAGNOSIS — M25562 Pain in left knee: Secondary | ICD-10-CM

## 2020-05-27 DIAGNOSIS — R262 Difficulty in walking, not elsewhere classified: Secondary | ICD-10-CM

## 2020-05-27 DIAGNOSIS — M25571 Pain in right ankle and joints of right foot: Secondary | ICD-10-CM

## 2020-05-27 DIAGNOSIS — M25662 Stiffness of left knee, not elsewhere classified: Secondary | ICD-10-CM

## 2020-05-27 DIAGNOSIS — M62838 Other muscle spasm: Secondary | ICD-10-CM

## 2020-05-27 DIAGNOSIS — M25552 Pain in left hip: Secondary | ICD-10-CM

## 2020-05-27 DIAGNOSIS — M6281 Muscle weakness (generalized): Secondary | ICD-10-CM

## 2020-05-27 NOTE — Therapy (Signed)
[place]. [place], [place], [address] Phone: [phone]   Fax:  [phone]  Physical Therapy Treatment  Patient Details  Name: Tonya Quinn MRN: [id number] Date of Birth: September 05, 1965 Referring Provider (PT): Rip Harbour   Encounter Date: 05/27/2020   PT End of Session - 05/27/20 0859    Visit Number 7    Number of Visits 17    Date for PT Re-Evaluation 06/17/20    Authorization Type UHC medicare    PT Start Time 0850    PT Stop Time 0928    PT Time Calculation (min) 38 min    Activity Tolerance Patient tolerated treatment well;Patient limited by pain    Behavior During Therapy [facility] for tasks assessed/performed           Past Medical History:  Diagnosis Date  . Asthma   . Atrial fibrillation ([place])   . DDD (degenerative disc disease), lumbar   . Diabetes mellitus   . HLD (hyperlipidemia)   . Hypertension   . Obesity   . Sleep apnea     Past Surgical History:  Procedure Laterality Date  . ABDOMINAL HYSTERECTOMY    . CYSTOSCOPY  02/29/2012   Procedure: CYSTOSCOPY;  Surgeon: Cheri Fowler, MD;  Location: [facility];  Service: Gynecology;  Laterality: N/A;  . LAPAROSCOPIC ASSISTED VAGINAL HYSTERECTOMY  02/29/2012   Procedure: LAPAROSCOPIC ASSISTED VAGINAL HYSTERECTOMY;  Surgeon: Cheri Fowler, MD;  Location: [facility];  Service: Gynecology;  Laterality: N/A;  . SALPINGOOPHORECTOMY  02/29/2012   Procedure: SALPINGO OOPHORECTOMY;  Surgeon: Cheri Fowler, MD;  Location: [facility];  Service: Gynecology;  Laterality: Bilateral;  . uterine ablation    . VEIN SURGERY      There were no vitals filed for this visit.   Subjective Assessment - 05/27/20 0857    Subjective No L knee pain today, R ankle 3/10. Took a ms relaxer yesterday                             [facility] Adult PT Treatment/Exercise - 05/27/20 0855      Knee/Hip Exercises: Stretches   Active Hamstring Stretch Both;1 rep;20 seconds     Other Knee/Hip Stretches calf stretch with strap B 20 "      Knee/Hip Exercises: Aerobic   Nustep L3 x 5 minutes      Knee/Hip Exercises: Standing   Other Standing Knee Exercises Hip ABD x 10 B, marches 10 x 2 B, minisquats x 10      Knee/Hip Exercises: Seated   Long Arc Quad Both;2 sets;10 reps    Long Arc Quad Weight --   2.5   Ball Squeeze 2 x 15    Other Seated Knee/Hip Exercises Quad Sets x 10, HS sets x 10. 10" holds    Marching Both;2 sets;10 reps    Marching Weights --   2.5   Hamstring Curl Both;2 sets;10 reps    Hamstring Limitations red tband      Ankle Exercises: Seated   Other Seated Ankle Exercises Seated ankle 4 way with yellow TB 10 x 2                    PT Short Term Goals - 04/22/20 1550      PT SHORT TERM GOAL #1   Title independent with initial HEP    Time 2    Period Weeks    Status New  Target Date 05/06/20             PT Long Term Goals - 04/22/20 1550      PT LONG TERM GOAL #1   Title Pt will have improved L Knee flexion ROM to at least 125 deg and </= 3/10 pain for improved functional mobility with gait and transfers    Time 8    Period Weeks    Status New    Target Date 06/17/20      PT LONG TERM GOAL #2   Title decrease pain to </= 3/10 with ADLs    Time 8    Period Weeks    Status New    Target Date 06/17/20      PT LONG TERM GOAL #3   Title Pt will be able to walk without limping and with improved symmetry of steps and stance, </= 3/10 pain.    Time 8    Period Weeks    Status New    Target Date 06/17/20      PT LONG TERM GOAL #4   Title BLE strength to at least 4/5    Time 8    Period Weeks    Status New    Target Date 06/17/20                 Plan - 05/27/20 0900    Clinical Impression Statement Decreased kne epain but continued r ankle pain today. Alot of cues given for pacing with exercises. Tolerated progression of resistance exercises nicely today with more standing strength and balance -  difficulty noted with balance using ski poles for support initially, changed to hands on stable elevated mat for stability instead due to decr balance confidence today.    Personal Factors and Comorbidities Comorbidity 3+;Past/Current Experience    Comorbidities uncontrolled T2DM, HTN, history of Afib, DJD, Asthma treated by inhaler.    Rehab Potential Good    PT Frequency 2x / week    PT Duration 8 weeks    PT Treatment/Interventions ADLs/Self Care Home Management;Cryotherapy;Electrical Stimulation;Iontophoresis 4mg /ml Dexamethasone;Moist Heat;Gait training;Neuromuscular re-education;Balance training;Therapeutic exercise;Therapeutic activities;Functional mobility training;Stair training;Patient/family education;Manual techniques    PT Next Visit Plan Continue to assess HEP,   manual and modalities as needed. progress TE for LE strengthening, knee stability activities. Pain and instability  with weightbearing so progress any standing exercises based on tolerance. If good standing tolerance do more standing strength and balance    Consulted and Agree with Plan of Care Patient           Patient will benefit from skilled therapeutic intervention in order to improve the following deficits and impairments:  Abnormal gait,Decreased range of motion,Difficulty walking,Decreased endurance,Decreased activity tolerance,Pain,Impaired flexibility,Improper body mechanics,Decreased mobility,Decreased strength,Increased muscle spasms,Decreased balance  Visit Diagnosis: Acute pain of left knee  Pain in left hip  Difficulty in walking, not elsewhere classified  Pain in right ankle and joints of right foot  Muscle weakness (generalized)  Other muscle spasm  Stiffness of left knee, not elsewhere classified     Problem List Patient Active Problem List   Diagnosis Date Noted  . Weakness 04/08/2015  . Diabetes mellitus without complication ([place]) 00/17/4944  . Anxiety 04/07/2015  . Left-sided  weakness 04/07/2015  . Hypertension   . Asthma   . DDD (degenerative disc disease), lumbar   . Obesity   . HLD (hyperlipidemia)   . Hypothyroidism   . Essential hypertension   . Atrial fibrillation ([place])   . Fibroids 03/01/2012  .  ALLERGIC RHINITIS 07/05/2006  . LOW BACK PAIN, CHRONIC 07/05/2006  . BACK PAIN 07/05/2006  . DYSURIA 07/05/2006    Hall Busing, PT, DPT 05/27/2020, 9:30 AM  [place]. [place], [place], [address] Phone: [phone]   Fax:  [phone]  Name: Tonya Quinn MRN: [id number] Date of Birth: 1965/12/08

## 2020-06-05 ENCOUNTER — Other Ambulatory Visit: Payer: Self-pay

## 2020-06-05 ENCOUNTER — Ambulatory Visit: Payer: Medicare Other

## 2020-06-05 DIAGNOSIS — M25562 Pain in left knee: Secondary | ICD-10-CM

## 2020-06-05 DIAGNOSIS — M25571 Pain in right ankle and joints of right foot: Secondary | ICD-10-CM

## 2020-06-05 DIAGNOSIS — M6281 Muscle weakness (generalized): Secondary | ICD-10-CM

## 2020-06-05 DIAGNOSIS — M25552 Pain in left hip: Secondary | ICD-10-CM

## 2020-06-05 DIAGNOSIS — M62838 Other muscle spasm: Secondary | ICD-10-CM

## 2020-06-05 DIAGNOSIS — R262 Difficulty in walking, not elsewhere classified: Secondary | ICD-10-CM

## 2020-06-05 DIAGNOSIS — M25662 Stiffness of left knee, not elsewhere classified: Secondary | ICD-10-CM

## 2020-06-05 NOTE — Therapy (Signed)
[place]. [place], [place], [address] Phone: [phone]   Fax:  [phone]  Physical Therapy Treatment  Patient Details  Name: Tonya Quinn MRN: [id number] Date of Birth: 12-Aug-1965 Referring Provider (PT): Rip Harbour   Encounter Date: 06/05/2020   PT End of Session - 06/05/20 1122    Visit Number 8    Number of Visits 17    Date for PT Re-Evaluation 06/17/20    Authorization Type UHC medicare    PT Start Time 1100    PT Stop Time 1149    PT Time Calculation (min) 49 min    Activity Tolerance Patient tolerated treatment well;Patient limited by pain    Behavior During Therapy [facility] for tasks assessed/performed           Past Medical History:  Diagnosis Date  . Asthma   . Atrial fibrillation ([place])   . DDD (degenerative disc disease), lumbar   . Diabetes mellitus   . HLD (hyperlipidemia)   . Hypertension   . Obesity   . Sleep apnea     Past Surgical History:  Procedure Laterality Date  . ABDOMINAL HYSTERECTOMY    . CYSTOSCOPY  02/29/2012   Procedure: CYSTOSCOPY;  Surgeon: Cheri Fowler, MD;  Location: [facility];  Service: Gynecology;  Laterality: N/A;  . LAPAROSCOPIC ASSISTED VAGINAL HYSTERECTOMY  02/29/2012   Procedure: LAPAROSCOPIC ASSISTED VAGINAL HYSTERECTOMY;  Surgeon: Cheri Fowler, MD;  Location: [facility];  Service: Gynecology;  Laterality: N/A;  . SALPINGOOPHORECTOMY  02/29/2012   Procedure: SALPINGO OOPHORECTOMY;  Surgeon: Cheri Fowler, MD;  Location: [facility];  Service: Gynecology;  Laterality: Bilateral;  . uterine ablation    . VEIN SURGERY      There were no vitals filed for this visit.   Subjective Assessment - 06/05/20 1103    Subjective R ankle seems to be getting better but left knee with alot of pain, seems to be going back or locking out. Left knee pain intense at night    Pertinent History uncontrolled T2DM, HTN, history of Afib, DJD, Asthma treated by inhaler. MRI L Knee  04/21/20: No meniscal or ligamentous injury of the left knee. Partial-thickness cartilage loss of the medial trochlea and medial femorotibial compartment.  Moderate joint effusion.  Small leaking Baker's cyst.    Limitations Lifting;Standing;Walking;House hold activities    How long can you sit comfortably? depends on how sitting - needs to be able to stretch out leg, no longer than 5 minutes with knee bent.    Patient Stated Goals have less pain and walk better    Currently in Pain? Yes    Pain Score 8     Pain Location Knee    Pain Orientation Left;Medial;Lateral;Mid    Pain Descriptors / Indicators Sore;Aching               [facility] Adult PT Treatment/Exercise - 06/05/20 0001      Knee/Hip Exercises: Standing   Knee Flexion Strengthening;Left;2 sets;10 reps    Other Standing Knee Exercises [place] TKE with yellow TB 2 x 10 B in  bars      Knee/Hip Exercises: Seated   Ball Squeeze 2 x 15    Marching Both;2 sets;15 reps      Modalities   Modalities Moist Heat;Electrical Stimulation      Moist Heat Therapy   Number Minutes Moist Heat 10 Minutes    Moist Heat Location --   left knee     Electrical Stimulation  Electrical Stimulation Location Left knee    Electrical Stimulation Action IFC - 15 minutes    Electrical Stimulation Goals Pain;Tone      Manual Therapy   Manual therapy comments muscle roller to thigh, quad, HS, gastroc                    PT Short Term Goals - 04/22/20 1550      PT SHORT TERM GOAL #1   Title independent with initial HEP    Time 2    Period Weeks    Status New    Target Date 05/06/20             PT Long Term Goals - 04/22/20 1550      PT LONG TERM GOAL #1   Title Pt will have improved L Knee flexion ROM to at least 125 deg and </= 3/10 pain for improved functional mobility with gait and transfers    Time 8    Period Weeks    Status New    Target Date 06/17/20      PT LONG TERM GOAL #2   Title decrease pain to </= 3/10  with ADLs    Time 8    Period Weeks    Status New    Target Date 06/17/20      PT LONG TERM GOAL #3   Title Pt will be able to walk without limping and with improved symmetry of steps and stance, </= 3/10 pain.    Time 8    Period Weeks    Status New    Target Date 06/17/20      PT LONG TERM GOAL #4   Title BLE strength to at least 4/5    Time 8    Period Weeks    Status New    Target Date 06/17/20                 Plan - 06/05/20 1123    Clinical Impression Statement Decreased R ankle pain but increased left knee pain today. Able to tolerate standing TKE in bars with emphasis on controlled TKE without locking out. Tolerated standing knee flexion on the left well today too.  Iniitated estim with heat end of session for pain mgmt on the left knee today with good tolerance.    Personal Factors and Comorbidities Comorbidity 3+;Past/Current Experience    Comorbidities uncontrolled T2DM, HTN, history of Afib, DJD, Asthma treated by inhaler.    PT Frequency 2x / week    PT Duration 8 weeks    PT Treatment/Interventions ADLs/Self Care Home Management;Cryotherapy;Electrical Stimulation;Iontophoresis 4mg /ml Dexamethasone;Moist Heat;Gait training;Neuromuscular re-education;Balance training;Therapeutic exercise;Therapeutic activities;Functional mobility training;Stair training;Patient/family education;Manual techniques    PT Next Visit Plan Continue to assess HEP,   manual and modalities as needed. progress TE for LE strengthening, knee stability activities. Pain and instability  with weightbearing so progress any standing exercises based on tolerance. If good standing tolerance do more standing strength and balance    PT Home Exercise Plan Discussed using rolling pin/ms roller for leg spasms/tender points at home, continue HEP, add stanfing knee bends    Consulted and Agree with Plan of Care Patient           Patient will benefit from skilled therapeutic intervention in order to  improve the following deficits and impairments:  Abnormal gait,Decreased range of motion,Difficulty walking,Decreased endurance,Decreased activity tolerance,Pain,Impaired flexibility,Improper body mechanics,Decreased mobility,Decreased strength,Increased muscle spasms,Decreased balance  Visit Diagnosis: Pain in left hip  Difficulty  in walking, not elsewhere classified  Acute pain of left knee  Pain in right ankle and joints of right foot  Muscle weakness (generalized)  Other muscle spasm  Stiffness of left knee, not elsewhere classified     Problem List Patient Active Problem List   Diagnosis Date Noted  . Weakness 04/08/2015  . Diabetes mellitus without complication ([place]) 61/51/8343  . Anxiety 04/07/2015  . Left-sided weakness 04/07/2015  . Hypertension   . Asthma   . DDD (degenerative disc disease), lumbar   . Obesity   . HLD (hyperlipidemia)   . Hypothyroidism   . Essential hypertension   . Atrial fibrillation ([place])   . Fibroids 03/01/2012  . ALLERGIC RHINITIS 07/05/2006  . LOW BACK PAIN, CHRONIC 07/05/2006  . BACK PAIN 07/05/2006  . DYSURIA 07/05/2006    Hall Busing, PT, DPT 06/05/2020, 11:39 AM  [place]. [place], [place], [address] Phone: [phone]   Fax:  [phone]  Name: Tonya Quinn MRN: [id number] Date of Birth: 1965/11/21

## 2020-06-08 ENCOUNTER — Ambulatory Visit: Payer: Medicare Other

## 2020-06-08 ENCOUNTER — Other Ambulatory Visit: Payer: Self-pay

## 2020-06-08 DIAGNOSIS — M25662 Stiffness of left knee, not elsewhere classified: Secondary | ICD-10-CM

## 2020-06-08 DIAGNOSIS — M6281 Muscle weakness (generalized): Secondary | ICD-10-CM

## 2020-06-08 DIAGNOSIS — M25552 Pain in left hip: Secondary | ICD-10-CM

## 2020-06-08 DIAGNOSIS — M62838 Other muscle spasm: Secondary | ICD-10-CM

## 2020-06-08 DIAGNOSIS — R262 Difficulty in walking, not elsewhere classified: Secondary | ICD-10-CM

## 2020-06-08 DIAGNOSIS — M25562 Pain in left knee: Secondary | ICD-10-CM | POA: Diagnosis not present

## 2020-06-08 DIAGNOSIS — M25571 Pain in right ankle and joints of right foot: Secondary | ICD-10-CM

## 2020-06-08 NOTE — Therapy (Signed)
[place]. [place], [place], [address] Phone: [phone]   Fax:  [phone]  Physical Therapy Treatment  Patient Details  Name: Tonya Quinn MRN: [id number] Date of Birth: 05/16/1965 Referring Provider (PT): Rip Harbour   Encounter Date: 06/08/2020   PT End of Session - 06/08/20 0810    Visit Number 9    Number of Visits 17    Date for PT Re-Evaluation 06/17/20    Authorization Type UHC medicare    PT Start Time 0806    PT Stop Time 0844    PT Time Calculation (min) 38 min    Activity Tolerance Patient tolerated treatment well;Patient limited by pain    Behavior During Therapy [facility] for tasks assessed/performed           Past Medical History:  Diagnosis Date  . Asthma   . Atrial fibrillation ([place])   . DDD (degenerative disc disease), lumbar   . Diabetes mellitus   . HLD (hyperlipidemia)   . Hypertension   . Obesity   . Sleep apnea     Past Surgical History:  Procedure Laterality Date  . ABDOMINAL HYSTERECTOMY    . CYSTOSCOPY  02/29/2012   Procedure: CYSTOSCOPY;  Surgeon: Cheri Fowler, MD;  Location: [facility];  Service: Gynecology;  Laterality: N/A;  . LAPAROSCOPIC ASSISTED VAGINAL HYSTERECTOMY  02/29/2012   Procedure: LAPAROSCOPIC ASSISTED VAGINAL HYSTERECTOMY;  Surgeon: Cheri Fowler, MD;  Location: [facility];  Service: Gynecology;  Laterality: N/A;  . SALPINGOOPHORECTOMY  02/29/2012   Procedure: SALPINGO OOPHORECTOMY;  Surgeon: Cheri Fowler, MD;  Location: [facility];  Service: Gynecology;  Laterality: Bilateral;  . uterine ablation    . VEIN SURGERY      There were no vitals filed for this visit.   Subjective Assessment - 06/08/20 0808    Subjective Left knee pain was pretty bad over the weekend and did not go down until sunday afternoon. thinks she overdid it last week    Pertinent History uncontrolled T2DM, HTN, history of Afib, DJD, Asthma treated by inhaler. MRI L Knee 04/21/20: No meniscal  or ligamentous injury of the left knee. Partial-thickness cartilage loss of the medial trochlea and medial femorotibial compartment.  Moderate joint effusion.  Small leaking Baker's cyst.    Limitations Lifting;Standing;Walking;House hold activities    How long can you sit comfortably? depends on how sitting - needs to be able to stretch out leg, no longer than 5 minutes with knee bent.    Patient Stated Goals have less pain and walk better    Currently in Pain? Yes    Pain Score 3     Pain Orientation Left;Medial;Lateral;Mid                             [facility] Adult PT Treatment/Exercise - 06/08/20 0001      Knee/Hip Exercises: Seated   Long Arc Quad Both;2 sets;10 reps    Ball Squeeze 2 x 15    Other Seated Knee/Hip Exercises Quad Sets x 10,   5-10" holds    Marching Both;2 sets;15 reps    Hamstring Curl Both;2 sets;10 reps    Hamstring Limitations red tband      Ankle Exercises: Seated   Heel Raises 20 reps;Both    Toe Raise 20 reps    Other Seated Ankle Exercises Seated ankle 4 way with red TB 10 x 2   bilateral  PT Short Term Goals - 04/22/20 1550      PT SHORT TERM GOAL #1   Title independent with initial HEP    Time 2    Period Weeks    Status New    Target Date 05/06/20             PT Long Term Goals - 04/22/20 1550      PT LONG TERM GOAL #1   Title Pt will have improved L Knee flexion ROM to at least 125 deg and </= 3/10 pain for improved functional mobility with gait and transfers    Time 8    Period Weeks    Status New    Target Date 06/17/20      PT LONG TERM GOAL #2   Title decrease pain to </= 3/10 with ADLs    Time 8    Period Weeks    Status New    Target Date 06/17/20      PT LONG TERM GOAL #3   Title Pt will be able to walk without limping and with improved symmetry of steps and stance, </= 3/10 pain.    Time 8    Period Weeks    Status New    Target Date 06/17/20      PT LONG TERM GOAL #4    Title BLE strength to at least 4/5    Time 8    Period Weeks    Status New    Target Date 06/17/20                 Plan - 06/08/20 0811    Clinical Impression Statement Avari reports she had increased left knee pain and some swelling over the weekend that went down on sunday. Exercises limited to sitting today, not as much standing due to pain exacerbation/ hesitance to do more standing. With stting exercises she had some exacerbation of knee pain with ankle inversion on the left at end ranges. MRI  of left knee from 04/2020 did not show any meniscal tears. Discussed use of external support like compression brace or ace wrap, reviewed ace wrapping technique for stability. Plan to reassess progress in next few sessions.    Personal Factors and Comorbidities Comorbidity 3+;Past/Current Experience    Comorbidities uncontrolled T2DM, HTN, history of Afib, DJD, Asthma treated by inhaler.    Rehab Potential Good    PT Frequency 2x / week    PT Duration 8 weeks    PT Treatment/Interventions ADLs/Self Care Home Management;Cryotherapy;Electrical Stimulation;Iontophoresis 4mg /ml Dexamethasone;Moist Heat;Gait training;Neuromuscular re-education;Balance training;Therapeutic exercise;Therapeutic activities;Functional mobility training;Stair training;Patient/family education;Manual techniques    PT Next Visit Plan 10th visit note next visit. Reassess progress/Recert next week.    manual and modalities as needed. progress TE for LE strengthening, knee stability activities. Pain and instability  with weightbearing so progress any standing exercises based on tolerance. If good standing tolerance do more standing strength and balance    Consulted and Agree with Plan of Care Patient           Patient will benefit from skilled therapeutic intervention in order to improve the following deficits and impairments:  Abnormal gait,Decreased range of motion,Difficulty walking,Decreased endurance,Decreased  activity tolerance,Pain,Impaired flexibility,Improper body mechanics,Decreased mobility,Decreased strength,Increased muscle spasms,Decreased balance  Visit Diagnosis: Pain in left hip  Difficulty in walking, not elsewhere classified  Acute pain of left knee  Muscle weakness (generalized)  Pain in right ankle and joints of right foot  Other muscle spasm  Stiffness of  left knee, not elsewhere classified     Problem List Patient Active Problem List   Diagnosis Date Noted  . Weakness 04/08/2015  . Diabetes mellitus without complication ([place]) 44/69/5072  . Anxiety 04/07/2015  . Left-sided weakness 04/07/2015  . Hypertension   . Asthma   . DDD (degenerative disc disease), lumbar   . Obesity   . HLD (hyperlipidemia)   . Hypothyroidism   . Essential hypertension   . Atrial fibrillation ([place])   . Fibroids 03/01/2012  . ALLERGIC RHINITIS 07/05/2006  . LOW BACK PAIN, CHRONIC 07/05/2006  . BACK PAIN 07/05/2006  . DYSURIA 07/05/2006    Hall Busing , PT, DPT 06/08/2020, 8:46 AM  [facility]. [place], [place], [address] Phone: [phone]   Fax:  [phone]  Name: Lauralynn Loeb MRN: [id number] Date of Birth: 1965-11-06

## 2020-06-10 ENCOUNTER — Ambulatory Visit: Payer: Medicare Other

## 2020-06-15 ENCOUNTER — Ambulatory Visit: Payer: Medicare Other | Attending: Family Medicine

## 2020-06-15 ENCOUNTER — Other Ambulatory Visit: Payer: Self-pay

## 2020-06-15 DIAGNOSIS — M79672 Pain in left foot: Secondary | ICD-10-CM | POA: Diagnosis present

## 2020-06-15 DIAGNOSIS — M25671 Stiffness of right ankle, not elsewhere classified: Secondary | ICD-10-CM | POA: Diagnosis present

## 2020-06-15 DIAGNOSIS — M25662 Stiffness of left knee, not elsewhere classified: Secondary | ICD-10-CM | POA: Diagnosis present

## 2020-06-15 DIAGNOSIS — M6281 Muscle weakness (generalized): Secondary | ICD-10-CM | POA: Insufficient documentation

## 2020-06-15 DIAGNOSIS — M25552 Pain in left hip: Secondary | ICD-10-CM | POA: Diagnosis present

## 2020-06-15 DIAGNOSIS — M25675 Stiffness of left foot, not elsewhere classified: Secondary | ICD-10-CM | POA: Insufficient documentation

## 2020-06-15 DIAGNOSIS — M25562 Pain in left knee: Secondary | ICD-10-CM | POA: Insufficient documentation

## 2020-06-15 DIAGNOSIS — M25571 Pain in right ankle and joints of right foot: Secondary | ICD-10-CM | POA: Diagnosis present

## 2020-06-15 DIAGNOSIS — R262 Difficulty in walking, not elsewhere classified: Secondary | ICD-10-CM | POA: Diagnosis present

## 2020-06-15 DIAGNOSIS — M62838 Other muscle spasm: Secondary | ICD-10-CM | POA: Insufficient documentation

## 2020-06-15 NOTE — Therapy (Signed)
[place]. [place], [place], [address] Phone: [phone]   Fax:  [phone]  Physical Therapy Treatment/Recertification/Progress Note Reporting Period 04/22/2020 to 06/15/2020  See note below for Objective Data and Assessment of Progress/Goals.       Patient Details  Name: Tonya Quinn MRN: [id number] Date of Birth: [birth year] Referring Provider (PT): Rip Harbour   Encounter Date: 06/15/2020   PT End of Session - 06/15/20 0820    Visit Number 10    Number of Visits --    Date for PT Re-Evaluation 07/17/20    Authorization Type UHC medicare    PT Start Time 0800    PT Stop Time 0845    PT Time Calculation (min) 45 min    Activity Tolerance Patient tolerated treatment well;Patient limited by pain    Behavior During Therapy [facility] for tasks assessed/performed           Past Medical History:  Diagnosis Date  . Asthma   . Atrial fibrillation ([place])   . DDD (degenerative disc disease), lumbar   . Diabetes mellitus   . HLD (hyperlipidemia)   . Hypertension   . Obesity   . Sleep apnea     Past Surgical History:  Procedure Laterality Date  . ABDOMINAL HYSTERECTOMY    . CYSTOSCOPY  02/29/2012   Procedure: CYSTOSCOPY;  Surgeon: Cheri Fowler, MD;  Location: [facility];  Service: Gynecology;  Laterality: N/A;  . LAPAROSCOPIC ASSISTED VAGINAL HYSTERECTOMY  02/29/2012   Procedure: LAPAROSCOPIC ASSISTED VAGINAL HYSTERECTOMY;  Surgeon: Cheri Fowler, MD;  Location: [facility];  Service: Gynecology;  Laterality: N/A;  . SALPINGOOPHORECTOMY  02/29/2012   Procedure: SALPINGO OOPHORECTOMY;  Surgeon: Cheri Fowler, MD;  Location: [facility];  Service: Gynecology;  Laterality: Bilateral;  . uterine ablation    . VEIN SURGERY      There were no vitals filed for this visit.   Subjective Assessment - 06/15/20 0843    Subjective Left knee pain is a little better but right ankle has been swelling up more. Brought ace bandage that  she has been using for knee and ankle compression sleeve that she has been using. Pain continues to be limiting.    Pertinent History uncontrolled T2DM, HTN, history of Afib, DJD, Asthma treated by inhaler. MRI L Knee 04/21/20: No meniscal or ligamentous injury of the left knee. Partial-thickness cartilage loss of the medial trochlea and medial femorotibial compartment.  Moderate joint effusion.  Small leaking Baker's cyst.    Limitations Lifting;Standing;Walking;House hold activities    How long can you sit comfortably? depends on how sitting - needs to be able to stretch out leg, no longer than 5 minutes with knee bent.    Patient Stated Goals have less pain and walk better    Currently in Pain? Yes    Pain Score 2     Pain Location Knee    Pain Orientation Left;Medial    Pain Descriptors / Indicators Aching;Sore    Multiple Pain Sites Yes    Pain Score 3    Pain Location Ankle    Pain Orientation Right;Lateral    Pain Descriptors / Indicators Aching;Sore              [facility] PT Assessment - 06/15/20 0001      AROM   Overall AROM Comments knee AROM 0-105 degrees flexion,      Strength   Overall Strength Comments left hip 3/5 with pain, left knee flexion 4-/5 with  diffiuclty and extension 4-/5 with pain. Right hip 4/5. Right knee ext 4+/5. Right knee flex 4/5. R DF 4/5, L DF4-/5 no pain              [facility] Adult PT Treatment/Exercise - 06/15/20 0001      Knee/Hip Exercises: Stretches   Other Knee/Hip Stretches low back forward flexion stretch in stiitn after some spasms when getting up from laying down      Knee/Hip Exercises: Seated   Long Arc Quad Both;2 sets;10 reps    [facility] 2 x 15    Marching Both;2 sets;15 reps   max cues for increasing height of march/leg raise     Knee/Hip Exercises: Supine   Other Supine Knee/Hip Exercises heel slides with strap 5 x 2 + pain          Therapeutic activities:  Ace wrap donning techniques for knee stability on the  left  Educated regarding compression garment use,elevation and exercises for right ankle swelling - significant dorsal edema today  Retrograde massage for the right ankle for general soft tissue mobility and to facilitate edema clearance.         PT Education - 06/15/20 0844    Education Details Reviewed compression bandage application techniques for knee stability/support. Educated regarding looking into compression sock that covers more of dorsum of right foot to prevent pocketing of edema near toes.    Person(s) Educated Patient    Methods Explanation;Demonstration    Comprehension Verbalized understanding;Returned demonstration            PT Short Term Goals - 04/22/20 1550      PT SHORT TERM GOAL #1   Title independent with initial HEP    Time 2    Period Weeks    Status New    Target Date 05/06/20             PT Long Term Goals - 06/15/20 0802      PT LONG TERM GOAL #1   Title Pt will have improved L Knee flexion ROM to at least 125 deg and </= 3/10 pain for improved functional mobility with gait and transfers    Time 8    Period Weeks    Status On-going   0-105 with 5/10 kne epain     PT LONG TERM GOAL #2   Title decrease pain to </= 3/10 with ADLs    Time 8    Period Weeks    Status On-going   ranging 3-6/10 when on feet for a long time, right ankle pressure/pain and left knee instability     PT LONG TERM GOAL #3   Title Pt will be able to walk without limping and with improved symmetry of steps and stance, </= 3/10 pain.    Time 8    Period Weeks    Status On-going   continued asymmetry of gait, antalgic     PT LONG TERM GOAL #4   Title BLE strength to at least 4/5    Time 8    Period Weeks    Status On-going   making gradual progress. Slight improvement in left knee/ankle and R ankle strength since initial eval                Plan - 06/15/20 0822    Clinical Impression Statement Kellyn is making very gradual progress towards goals for  mobility, strength and function. She reports feeling some improvement in left hip, knee and right ankle but  continues to have pain and be limited especially when staying on feet for long periods of time. She reports Left knee pain is slightly better, but Right ankle with increased swelling today. She came in with a compression sleeve for the right ankle that stopped about 1/3rd of the way distal of ankle leaving most of the dorsum of foot exposed with observable/palpable swelling pooling in that area. Educated on use of compression stocking (or alternate use of ace bandage if she continues to have a hard time finding something that fits) to encapture that area of swelling with compression. Also educated on proper ace wrap application for external support/stability of the left knee with good tolerance today. Advised her to follow up with MD if the edma/increase in pain persists. She would continue to benefit from skilled PT to work on improving strength and mobility, decreasing pain.    Personal Factors and Comorbidities Comorbidity 3+;Past/Current Experience    Comorbidities uncontrolled T2DM, HTN, history of Afib, DJD, Asthma treated by inhaler.    Rehab Potential Good    PT Frequency 2x / week    PT Duration 4 weeks    PT Treatment/Interventions ADLs/Self Care Home Management;Cryotherapy;Electrical Stimulation;Iontophoresis 4mg /ml Dexamethasone;Moist Heat;Gait training;Neuromuscular re-education;Balance training;Therapeutic exercise;Therapeutic activities;Functional mobility training;Stair training;Patient/family education;Manual techniques    PT Next Visit Plan manual and modalities as needed. progress TE for LE strengthening, knee stability activities. Pain and instability  with weightbearing so progress any standing exercises based on tolerance. If good standing tolerance do more standing strength and balance    Consulted and Agree with Plan of Care Patient           Patient will benefit from  skilled therapeutic intervention in order to improve the following deficits and impairments:  Abnormal gait,Decreased range of motion,Difficulty walking,Decreased endurance,Decreased activity tolerance,Pain,Impaired flexibility,Improper body mechanics,Decreased mobility,Decreased strength,Increased muscle spasms,Decreased balance  Visit Diagnosis: Pain in left hip  Difficulty in walking, not elsewhere classified  Acute pain of left knee  Pain in right ankle and joints of right foot  Other muscle spasm  Stiffness of left knee, not elsewhere classified  Muscle weakness (generalized)     Problem List Patient Active Problem List   Diagnosis Date Noted  . Weakness 04/08/2015  . Diabetes mellitus without complication ([place]) 77/41/2878  . Anxiety 04/07/2015  . Left-sided weakness 04/07/2015  . Hypertension   . Asthma   . DDD (degenerative disc disease), lumbar   . Obesity   . HLD (hyperlipidemia)   . Hypothyroidism   . Essential hypertension   . Atrial fibrillation ([place])   . Fibroids 03/01/2012  . ALLERGIC RHINITIS 07/05/2006  . LOW BACK PAIN, CHRONIC 07/05/2006  . BACK PAIN 07/05/2006  . DYSURIA 07/05/2006    Hall Busing, PT, DPT 06/15/2020, 12:33 PM  [facility]. [place], [place], [address] Phone: [phone]   Fax:  [phone]  Name: Lazariah Savard MRN: [id number] Date of Birth: 08/23/1965

## 2020-06-17 ENCOUNTER — Other Ambulatory Visit: Payer: Self-pay

## 2020-06-17 ENCOUNTER — Ambulatory Visit: Payer: Medicare Other

## 2020-06-17 DIAGNOSIS — M25552 Pain in left hip: Secondary | ICD-10-CM | POA: Diagnosis not present

## 2020-06-17 DIAGNOSIS — M25571 Pain in right ankle and joints of right foot: Secondary | ICD-10-CM

## 2020-06-17 DIAGNOSIS — M6281 Muscle weakness (generalized): Secondary | ICD-10-CM

## 2020-06-17 DIAGNOSIS — R262 Difficulty in walking, not elsewhere classified: Secondary | ICD-10-CM

## 2020-06-17 DIAGNOSIS — M25562 Pain in left knee: Secondary | ICD-10-CM

## 2020-06-17 DIAGNOSIS — M25662 Stiffness of left knee, not elsewhere classified: Secondary | ICD-10-CM

## 2020-06-17 DIAGNOSIS — M62838 Other muscle spasm: Secondary | ICD-10-CM

## 2020-06-17 NOTE — Therapy (Signed)
[place]. [place], [place], [address] Phone: [phone]   Fax:  [phone]  Physical Therapy Treatment  Patient Details  Name: Tonya Quinn MRN: [id number] Date of Birth: April 13, 1965 Referring Provider (PT): Francesca Jewett Date: 06/17/2020   PT End of Session - 06/17/20 0811    Visit Number 11    Date for PT Re-Evaluation 07/17/20    Authorization Type [employer] medicare    PT Start Time 0800    PT Stop Time 0845    PT Time Calculation (min) 45 min    Activity Tolerance Patient tolerated treatment well;Patient limited by pain    Behavior During Therapy [facility] for tasks assessed/performed           Past Medical History:  Diagnosis Date  . Asthma   . Atrial fibrillation ([place])   . DDD (degenerative disc disease), lumbar   . Diabetes mellitus   . HLD (hyperlipidemia)   . Hypertension   . Obesity   . Sleep apnea     Past Surgical History:  Procedure Laterality Date  . ABDOMINAL HYSTERECTOMY    . CYSTOSCOPY  02/29/2012   Procedure: CYSTOSCOPY;  Surgeon: Cheri Fowler, MD;  Location: [facility];  Service: Gynecology;  Laterality: N/A;  . LAPAROSCOPIC ASSISTED VAGINAL HYSTERECTOMY  02/29/2012   Procedure: LAPAROSCOPIC ASSISTED VAGINAL HYSTERECTOMY;  Surgeon: Cheri Fowler, MD;  Location: [facility];  Service: Gynecology;  Laterality: N/A;  . SALPINGOOPHORECTOMY  02/29/2012   Procedure: SALPINGO OOPHORECTOMY;  Surgeon: Cheri Fowler, MD;  Location: [facility];  Service: Gynecology;  Laterality: Bilateral;  . uterine ablation    . VEIN SURGERY      There were no vitals filed for this visit.   Subjective Assessment - 06/17/20 0805    Subjective feeling a little better today. Wearing compression stocking for the right foot and thinks it is helping.    Pertinent History uncontrolled T2DM, HTN, history of Afib, DJD, Asthma treated by inhaler. MRI L Knee 04/21/20: No meniscal or ligamentous injury of the left knee.  Partial-thickness cartilage loss of the medial trochlea and medial femorotibial compartment.  Moderate joint effusion.  Small leaking Baker's cyst.    Limitations Lifting;Standing;Walking;House hold activities    How long can you sit comfortably? depends on how sitting - needs to be able to stretch out leg, no longer than 5 minutes with knee bent.    Patient Stated Goals have less pain and walk better    Currently in Pain? Yes    Pain Score 2     Pain Location Knee    Pain Orientation Left;Medial    Pain Descriptors / Indicators Aching;Sore    Pain Score 2    Pain Location Ankle    Pain Orientation Right;Lateral              [facility] PT Assessment - 06/17/20 0001      Observation/Other Assessments   Focus on Therapeutic Outcomes (FOTO)  43%            [facility] Adult PT Treatment/Exercise - 06/17/20 0001      Ambulation/Gait   Pre-Gait Activities ML weight shifts on airex pad      High Level Balance   High Level Balance Activities Side stepping   along edge of mat table     Exercises   Exercises Knee/Hip;Ankle      Knee/Hip Exercises: Stretches   Other Knee/Hip Stretches physioball rollouts 3 way for management of LBP  after standing exercise      Knee/Hip Exercises: Seated   Long Arc Quad Both;2 sets;15 reps    Long Arc Quad Weight 1 lbs.    Ball Squeeze 2 x 20    Other Seated Knee/Hip Exercises seated SLR forward x 2 B - not well tolerated on left knee.    Marching Both;2 sets;15 reps    Marching Weights 1 lbs.    Hamstring Curl Both;2 sets;15 reps    Hamstring Limitations red tb    Abduction/Adduction  Strengthening;Both;2 sets;15 reps    Abd/Adduction Limitations green tb                  PT Education - 06/17/20 0847    Education Details Updated initial HEP. [id number]    Person(s) Educated Patient    Methods Explanation;Demonstration    Comprehension Verbalized understanding;Returned demonstration;Need further instruction            PT Short Term  Goals - 04/22/20 1550      PT SHORT TERM GOAL #1   Title independent with initial HEP    Time 2    Period Weeks    Status New    Target Date 05/06/20             PT Long Term Goals - 06/15/20 0802      PT LONG TERM GOAL #1   Title Pt will have improved L Knee flexion ROM to at least 125 deg and </= 3/10 pain for improved functional mobility with gait and transfers    Time 8    Period Weeks    Status On-going   0-105 with 5/10 kne epain     PT LONG TERM GOAL #2   Title decrease pain to </= 3/10 with ADLs    Time 8    Period Weeks    Status On-going   ranging 3-6/10 when on feet for a long time, right ankle pressure/pain and left knee instability     PT LONG TERM GOAL #3   Title Pt will be able to walk without limping and with improved symmetry of steps and stance, </= 3/10 pain.    Time 8    Period Weeks    Status On-going   continued asymmetry of gait, antalgic     PT LONG TERM GOAL #4   Title BLE strength to at least 4/5    Time 8    Period Weeks    Status On-going   making gradual progress. Slight improvement in left knee/ankle and R ankle strength since initial eval                Plan - 06/17/20 0812    Clinical Impression Statement Tolerated all exercises well today. Putting full weight on right ankle or prolonged weight on left knee still making pain worse. Able to tolerate seated exercise progressions well but continues to be limited with standing tolerance. We were able to do fitter leg presses and some ML weight shifts in standing with fair tolerance today    Personal Factors and Comorbidities Comorbidity 3+;Past/Current Experience    Comorbidities uncontrolled T2DM, HTN, history of Afib, DJD, Asthma treated by inhaler.    Rehab Potential Good    PT Frequency 2x / week    PT Duration 4 weeks    PT Treatment/Interventions ADLs/Self Care Home Management;Cryotherapy;Electrical Stimulation;Iontophoresis 4mg /ml Dexamethasone;Moist Heat;Gait  training;Neuromuscular re-education;Balance training;Therapeutic exercise;Therapeutic activities;Functional mobility training;Stair training;Patient/family education;Manual techniques    PT Next Visit Plan manual  and modalities as needed. progress TE for LE strengthening, knee stability activities. Pain and instability  with weightbearing so progress any standing exercises based on tolerance. If good standing tolerance do more standing strength and balance    Consulted and Agree with Plan of Care Patient           Patient will benefit from skilled therapeutic intervention in order to improve the following deficits and impairments:  Abnormal gait,Decreased range of motion,Difficulty walking,Decreased endurance,Decreased activity tolerance,Pain,Impaired flexibility,Improper body mechanics,Decreased mobility,Decreased strength,Increased muscle spasms,Decreased balance  Visit Diagnosis: Pain in left hip  Difficulty in walking, not elsewhere classified  Acute pain of left knee  Pain in right ankle and joints of right foot  Other muscle spasm  Stiffness of left knee, not elsewhere classified  Muscle weakness (generalized)     Problem List Patient Active Problem List   Diagnosis Date Noted  . Weakness 04/08/2015  . Diabetes mellitus without complication ([place]) 03/02/7587  . Anxiety 04/07/2015  . Left-sided weakness 04/07/2015  . Hypertension   . Asthma   . DDD (degenerative disc disease), lumbar   . Obesity   . HLD (hyperlipidemia)   . Hypothyroidism   . Essential hypertension   . Atrial fibrillation ([place])   . Fibroids 03/01/2012  . ALLERGIC RHINITIS 07/05/2006  . LOW BACK PAIN, CHRONIC 07/05/2006  . BACK PAIN 07/05/2006  . DYSURIA 07/05/2006    Hall Busing, PT, DPT 06/17/2020, 8:48 AM  [facility]. [place], [place], [address] Phone: [phone]   Fax:  [phone]  Name: Jamyiah Labella MRN:  [id number] Date of Birth: 18-Aug-1965

## 2020-07-01 ENCOUNTER — Other Ambulatory Visit: Payer: Self-pay

## 2020-07-01 ENCOUNTER — Ambulatory Visit: Payer: Medicare Other

## 2020-07-01 DIAGNOSIS — M25662 Stiffness of left knee, not elsewhere classified: Secondary | ICD-10-CM

## 2020-07-01 DIAGNOSIS — M25552 Pain in left hip: Secondary | ICD-10-CM | POA: Diagnosis not present

## 2020-07-01 DIAGNOSIS — R262 Difficulty in walking, not elsewhere classified: Secondary | ICD-10-CM

## 2020-07-01 DIAGNOSIS — M25675 Stiffness of left foot, not elsewhere classified: Secondary | ICD-10-CM

## 2020-07-01 DIAGNOSIS — M79672 Pain in left foot: Secondary | ICD-10-CM

## 2020-07-01 DIAGNOSIS — M25562 Pain in left knee: Secondary | ICD-10-CM

## 2020-07-01 DIAGNOSIS — M25571 Pain in right ankle and joints of right foot: Secondary | ICD-10-CM

## 2020-07-01 DIAGNOSIS — M25671 Stiffness of right ankle, not elsewhere classified: Secondary | ICD-10-CM

## 2020-07-01 DIAGNOSIS — M6281 Muscle weakness (generalized): Secondary | ICD-10-CM

## 2020-07-01 NOTE — Therapy (Signed)
[facility]. [place], [place], [address] Phone: [phone]   Fax:  [phone]  Physical Therapy Treatment/Re-evaluation  Patient Details  Name: Tonya Quinn MRN: [id number] Date of Birth: Aug 31, 1965 Referring Provider (PT): Rip Harbour   Encounter Date: 07/01/2020   PT End of Session - 07/01/20 0922    Visit Number 12    Date for PT Re-Evaluation 07/31/20    Authorization Type [employer] medicare    PT Start Time 0845    PT Stop Time 0923    PT Time Calculation (min) 38 min    Activity Tolerance Patient tolerated treatment well;Patient limited by pain    Behavior During Therapy [place] for tasks assessed/performed           Past Medical History:  Diagnosis Date  . Asthma   . Atrial fibrillation ([place])   . DDD (degenerative disc disease), lumbar   . Diabetes mellitus   . HLD (hyperlipidemia)   . Hypertension   . Obesity   . Sleep apnea     Past Surgical History:  Procedure Laterality Date  . ABDOMINAL HYSTERECTOMY    . CYSTOSCOPY  02/29/2012   Procedure: CYSTOSCOPY;  Surgeon: Cheri Fowler, MD;  Location: [facility];  Service: Gynecology;  Laterality: N/A;  . LAPAROSCOPIC ASSISTED VAGINAL HYSTERECTOMY  02/29/2012   Procedure: LAPAROSCOPIC ASSISTED VAGINAL HYSTERECTOMY;  Surgeon: Cheri Fowler, MD;  Location: [facility];  Service: Gynecology;  Laterality: N/A;  . SALPINGOOPHORECTOMY  02/29/2012   Procedure: SALPINGO OOPHORECTOMY;  Surgeon: Cheri Fowler, MD;  Location: [facility];  Service: Gynecology;  Laterality: Bilateral;  . uterine ablation    . VEIN SURGERY      There were no vitals filed for this visit.   Subjective Assessment - 07/01/20 0847    Subjective Saw Dr Ronnald Ramp and Rip Harbour on Monday. Was given a little brace for the right ankle and ice recomennded for  the feet. Over past 3 weeks bottom of left foot started to hurt, as well as the right but not as much.  Was given new Rx for plantar fasciitis. left  knee still bothers but not as much as the feet. Reports she was told she has bone spurs at her heels    Pertinent History uncontrolled T2DM, HTN, history of Afib, DJD, Asthma treated by inhaler. MRI L Knee 04/21/20: No meniscal or ligamentous injury of the left knee. Partial-thickness cartilage loss of the medial trochlea and medial femorotibial compartment.  Moderate joint effusion.  Small leaking Baker's cyst.    Patient Stated Goals have less pain and walk better    Currently in Pain? Yes    Pain Score 8     Pain Location Ankle    Pain Orientation Right;Lateral    Pain Descriptors / Indicators Sharp;Aching;Sore    Pain Type Acute pain    Pain Score 8    Pain Location Heel    Pain Orientation Left    Pain Descriptors / Indicators Sharp;Dull    Aggravating Factors  Worse when first wakng up              [facility] PT Assessment - 07/01/20 0001      Assessment   Medical Diagnosis L plantar fasciitis, Right ankle pain    Referring Provider (PT) Rip Harbour    Onset Date/Surgical Date 06/10/20      AROM   Right/Left Ankle Right;Left    Right Ankle Dorsiflexion -5    Right Ankle Plantar Flexion 35  from -5   Left Ankle Dorsiflexion 5    Left Ankle Plantar Flexion 45      Strength   Overall Strength Comments left hip 3/5 with pain, left knee flexion 4-/5 with diffiuclty and extension 4-/5 with pain. Right hip 4/5. Right knee ext 4+/5. Right knee flex 4/5. R DF 4/5, L DF4-/5 no pain      Palpation   Palpation comment Spasms at sole of feet after DF/PF.      Special Tests   Other special tests + windlass test for PF pain on the left.                [facility] PT Treatment/Exercise - 07/01/20 0001      Ambulation/Gait   Pre-Gait Activities gait train with quad cane right - mod cues and dmeo for sequencing      Modalities   Modalities Cryotherapy      Cryotherapy   Number Minutes Cryotherapy 5 Minutes    Cryotherapy Location Ankle   foot   Type of Cryotherapy --   Ice  probe     Ankle Exercises: Stretches   Soleus Stretch 2 reps;30 seconds   bilateral, seated with strap     Ankle Exercises: Seated   ABC's 1 rep   Bilateral   Ankle Circles/Pumps AROM;Both;20 reps    Other Seated Ankle Exercises Ankle Inv/Ev AROM x 20 B                  PT Education - 07/01/20 0924    Education Details New HEP with emphasis on ankle/foot mobility. [email or web address]. Recommended frzen waterbottle rolls for plantar foot pain    Person(s) Educated Patient    Methods Explanation;Demonstration;Handout    Comprehension Verbalized understanding;Returned demonstration;Need further instruction            PT Short Term Goals - 07/01/20 0930      PT SHORT TERM GOAL #1   Title independent with updated HEP    Time 2    Period Weeks    Status New    Target Date 07/15/20             PT Long Term Goals - 07/01/20 0930      PT LONG TERM GOAL #1   Title Pt will have improved L Knee flexion ROM to at least 125 deg and </= 3/10 pain for improved functional mobility with gait and transfers    Time 4    Period Weeks    Status On-going   0-105 with 5/10 kne epain     PT LONG TERM GOAL #2   Title decrease pain to </= 3/10 with ADLs    Time 4    Period Weeks    Status On-going   ranging 3-6/10 when on feet for a long time, right ankle pressure/pain and left knee instability     PT LONG TERM GOAL #3   Title Pt will be able to walk without limping and with improved symmetry of steps and stance, </= 3/10 pain.    Time 4    Period Weeks    Status On-going   continued asymmetry of gait, antalgic     PT LONG TERM GOAL #4   Title BLE strength to at least 4/5    Time 4    Period Weeks    Status On-going   making gradual progress. Slight improvement in left knee/ankle and R ankle strength since initial eval     PT LONG  TERM GOAL #5   Title B ankle ROM to at least 10 deg DF to 50 deg PF without pain    Time 4    Period Weeks    Status New                 Plan  - 07/01/20 0926    Clinical Impression Statement [name] returns to PT today with new script  for primary c/o left plantar fasciitis and right ankle pain. She reports Left knee pain has diminished and feet are most limiting. Getting first step pain that is worst in the mornings. Still has limited tolerance to being on feet for long periods d/t pain in ankle/feet. She presents with ms weakness, Decreased ankle ROM and Strength B, decreased medial arches of feet and tenderness along plantar fascia and heels B (L>R). As a result she has limited tolerance to WB and upright functional mobility, using a quad cane for support when walking as needed. She tolerated ROM and stretching exercises well today and was instructed in updated  HEP in addition to use of frozen waterbottle for feet, pt VU. She will benefit from continued skilled physical therapy to work on improving these impairments and facilitating painfree functional mobility.   Personal Factors and Comorbidities Comorbidity 3+;Past/Current Experience    Comorbidities uncontrolled T2DM, HTN, history of Afib, DJD, Asthma treated by inhaler.    Rehab Potential Good    PT Frequency 2x / week    PT Duration 4 weeks    PT Treatment/Interventions ADLs/Self Care Home Management;Cryotherapy;Electrical Stimulation;Iontophoresis 4mg /ml Dexamethasone;Moist Heat;Gait training;Neuromuscular re-education;Balance training;Therapeutic exercise;Therapeutic activities;Functional mobility training;Stair training;Patient/family education;Manual techniques;Taping    PT Next Visit Plan manual and modalities as needed. progress TE for LE strengthening, ankle/foot mobility. Pain and instability  with weightbearing so progress any standing exercises based on tolerance. If good standing tolerance do more standing strength and balance    Consulted and Agree with Plan of Care Patient           Patient will benefit from skilled therapeutic intervention in order to improve the  following deficits and impairments:  Abnormal gait,Decreased range of motion,Difficulty walking,Decreased endurance,Decreased activity tolerance,Pain,Impaired flexibility,Improper body mechanics,Decreased mobility,Decreased strength,Increased muscle spasms,Decreased balance  Visit Diagnosis: Difficulty in walking, not elsewhere classified  Acute pain of left knee  Pain in right ankle and joints of right foot  Stiffness of left knee, not elsewhere classified  Muscle weakness (generalized)  Pain in left foot  Stiffness of left foot, not elsewhere classified     Problem List Patient Active Problem List   Diagnosis Date Noted  . Weakness 04/08/2015  . Diabetes mellitus without complication ([place]) 11/94/1740  . Anxiety 04/07/2015  . Left-sided weakness 04/07/2015  . Hypertension   . Asthma   . DDD (degenerative disc disease), lumbar   . Obesity   . HLD (hyperlipidemia)   . Hypothyroidism   . Essential hypertension   . Atrial fibrillation ([place])   . Fibroids 03/01/2012  . ALLERGIC RHINITIS 07/05/2006  . LOW BACK PAIN, CHRONIC 07/05/2006  . BACK PAIN 07/05/2006  . DYSURIA 07/05/2006    Hall Busing, PT, DPT 07/01/2020, 12:08 PM  [facility]. [employer], [place], [address] Phone: [phone]   Fax:  [phone]  Name: Tonya Quinn MRN: [id number] Date of Birth: 1965-08-09

## 2020-07-01 NOTE — Patient Instructions (Signed)
Access Code: [email or web address] URL: [email or web address] Date: 07/01/2020 Prepared by: Sherlynn Stalls  Exercises Seated Heel Toe Raises - 1 x daily - 7 x weekly - 3 sets - 10 reps Seated Ankle Inversion Eversion AROM - 1 x daily - 7 x weekly - 3 sets - 10 reps Seated Ankle Alphabet - 1 x daily - 7 x weekly - 2 sets Seated Calf Stretch with Strap - 1 x daily - 7 x weekly - 5 sets - 30 seconds hold Foot Roller Plantar Massage - 1 x daily - 7 x weekly - 5-10 minutes hold

## 2020-07-03 ENCOUNTER — Ambulatory Visit: Payer: Medicare Other

## 2020-07-03 ENCOUNTER — Other Ambulatory Visit: Payer: Self-pay

## 2020-07-03 ENCOUNTER — Encounter (HOSPITAL_BASED_OUTPATIENT_CLINIC_OR_DEPARTMENT_OTHER): Payer: Self-pay | Admitting: Emergency Medicine

## 2020-07-03 ENCOUNTER — Emergency Department (HOSPITAL_BASED_OUTPATIENT_CLINIC_OR_DEPARTMENT_OTHER)
Admission: EM | Admit: 2020-07-03 | Discharge: 2020-07-03 | Disposition: A | Discharge status: Home / Self Care | Payer: Medicare Other | Attending: Emergency Medicine | Admitting: Emergency Medicine

## 2020-07-03 ENCOUNTER — Emergency Department (HOSPITAL_BASED_OUTPATIENT_CLINIC_OR_DEPARTMENT_OTHER): Payer: Medicare Other

## 2020-07-03 VITALS — BP 149/100

## 2020-07-03 DIAGNOSIS — Z79899 Other long term (current) drug therapy: Secondary | ICD-10-CM | POA: Insufficient documentation

## 2020-07-03 DIAGNOSIS — J45909 Unspecified asthma, uncomplicated: Secondary | ICD-10-CM | POA: Diagnosis not present

## 2020-07-03 DIAGNOSIS — M6281 Muscle weakness (generalized): Secondary | ICD-10-CM

## 2020-07-03 DIAGNOSIS — Z7984 Long term (current) use of oral hypoglycemic drugs: Secondary | ICD-10-CM | POA: Diagnosis not present

## 2020-07-03 DIAGNOSIS — R531 Weakness: Secondary | ICD-10-CM | POA: Insufficient documentation

## 2020-07-03 DIAGNOSIS — R609 Edema, unspecified: Secondary | ICD-10-CM | POA: Diagnosis not present

## 2020-07-03 DIAGNOSIS — R110 Nausea: Secondary | ICD-10-CM | POA: Insufficient documentation

## 2020-07-03 DIAGNOSIS — E785 Hyperlipidemia, unspecified: Secondary | ICD-10-CM | POA: Diagnosis not present

## 2020-07-03 DIAGNOSIS — M25671 Stiffness of right ankle, not elsewhere classified: Secondary | ICD-10-CM

## 2020-07-03 DIAGNOSIS — I10 Essential (primary) hypertension: Secondary | ICD-10-CM | POA: Diagnosis not present

## 2020-07-03 DIAGNOSIS — M25552 Pain in left hip: Secondary | ICD-10-CM

## 2020-07-03 DIAGNOSIS — M7918 Myalgia, other site: Secondary | ICD-10-CM | POA: Diagnosis not present

## 2020-07-03 DIAGNOSIS — M25675 Stiffness of left foot, not elsewhere classified: Secondary | ICD-10-CM

## 2020-07-03 DIAGNOSIS — M25662 Stiffness of left knee, not elsewhere classified: Secondary | ICD-10-CM

## 2020-07-03 DIAGNOSIS — M25562 Pain in left knee: Secondary | ICD-10-CM

## 2020-07-03 DIAGNOSIS — R079 Chest pain, unspecified: Secondary | ICD-10-CM | POA: Diagnosis not present

## 2020-07-03 DIAGNOSIS — R0602 Shortness of breath: Secondary | ICD-10-CM | POA: Diagnosis not present

## 2020-07-03 DIAGNOSIS — Z794 Long term (current) use of insulin: Secondary | ICD-10-CM | POA: Insufficient documentation

## 2020-07-03 DIAGNOSIS — R262 Difficulty in walking, not elsewhere classified: Secondary | ICD-10-CM

## 2020-07-03 DIAGNOSIS — M62838 Other muscle spasm: Secondary | ICD-10-CM

## 2020-07-03 DIAGNOSIS — M79672 Pain in left foot: Secondary | ICD-10-CM

## 2020-07-03 DIAGNOSIS — R252 Cramp and spasm: Secondary | ICD-10-CM

## 2020-07-03 DIAGNOSIS — E1169 Type 2 diabetes mellitus with other specified complication: Secondary | ICD-10-CM | POA: Diagnosis not present

## 2020-07-03 DIAGNOSIS — M25571 Pain in right ankle and joints of right foot: Secondary | ICD-10-CM

## 2020-07-03 LAB — BASIC METABOLIC PANEL
Anion gap: 8 (ref 5–15)
BUN: 13 mg/dL (ref 6–20)
CO2: 27 mmol/L (ref 22–32)
Calcium: 8.8 mg/dL — ABNORMAL LOW (ref 8.9–10.3)
Chloride: 102 mmol/L (ref 98–111)
Creatinine, Ser: 0.67 mg/dL (ref 0.44–1.00)
GFR, Estimated: >60 mL/min (ref >60)
Glucose, Bld: 143 mg/dL — ABNORMAL HIGH (ref 70–99)
Potassium: 4.1 mmol/L (ref 3.5–5.1)
Sodium: 137 mmol/L (ref 135–145)

## 2020-07-03 LAB — CBC WITH DIFFERENTIAL/PLATELET
Abs Immature Granulocytes: 0.01 10*3/uL (ref 0.00–0.07)
Basophils Absolute: 0 10*3/uL (ref 0.0–0.1)
Basophils Relative: 0 %
Eosinophils Absolute: 0.1 10*3/uL (ref 0.0–0.5)
Eosinophils Relative: 1 %
HCT: 40.7 % (ref 36.0–46.0)
Hemoglobin: 12.6 g/dL (ref 12.0–15.0)
Immature Granulocytes: 0 %
Lymphocytes Relative: 31 %
Lymphs Abs: 1.7 10*3/uL (ref 0.7–4.0)
MCH: 27.2 pg (ref 26.0–34.0)
MCHC: 31 g/dL (ref 30.0–36.0)
MCV: 87.9 fL (ref 80.0–100.0)
Monocytes Absolute: 0.4 10*3/uL (ref 0.1–1.0)
Monocytes Relative: 8 %
Neutro Abs: 3.2 10*3/uL (ref 1.7–7.7)
Neutrophils Relative %: 60 %
Platelets: 157 10*3/uL (ref 150–400)
RBC: 4.63 MIL/uL (ref 3.87–5.11)
RDW: 14.6 % (ref 11.5–15.5)
WBC: 5.4 10*3/uL (ref 4.0–10.5)
nRBC: 0 % (ref 0.0–0.2)

## 2020-07-03 LAB — TROPONIN I (HIGH SENSITIVITY)
Troponin I (High Sensitivity): <2 ng/L (ref <18)
Troponin I (High Sensitivity): <2 ng/L (ref <18)

## 2020-07-03 LAB — CBG MONITORING, ED: Glucose-Capillary: 139 mg/dL — ABNORMAL HIGH (ref 70–99)

## 2020-07-03 LAB — BRAIN NATRIURETIC PEPTIDE: B Natriuretic Peptide: 8.8 pg/mL (ref 0.0–100.0)

## 2020-07-03 MED ORDER — METHOCARBAMOL 500 MG PO TABS: 500.0000 mg | ORAL_TABLET | Freq: Two times a day (BID) | ORAL | 0 refills | Status: DC

## 2020-07-03 NOTE — ED Provider Notes (Signed)
[facility] EMERGENCY DEPARTMENT Provider Note   CSN: [id number] Arrival date & time: 07/03/20  0944     History Chief Complaint  Patient presents with  . Chest Pain    Tonya Quinn is a 55 y.o. female with a past medical history significant for asthma, atrial fibrillation, diabetes, hyperlipidemia, and hypertension who presents to the ED due to "spasms" to left upper and lower extremities that occurred during physical therapy today.  Patient states she was sitting at physical therapy doing ankle exercises when she felt "muscle cramps" from her neck down her left upper extremity and her left calf. She states her left calf felt like a "charlie horse was about to start". Patient notes cramps have been intermittent for the past 2 to 3 weeks. LUE cramp associated with subjective weakness today. Denies speech and visual changes. No dizziness. Patient also endorses central chest heaviness that began when she reported to the ED.  Patient notes shortness of breath and nausea that occurred during cramps. No previous MI or CVA. Denies tobacco, drug, and alcohol use. Denies history of blood clots, recent surgeries, recent long immobilizations, and hormonal treatments.  No lower extremity edema. She is currently chest pain free. No treatment prior to arrival. She has not taken anything for her muscle cramps/spasms over the past few weeks.   History obtained from patient and past medical records. No interpreter used during encounter.   HPI: A 55 year old patient with a history of treated diabetes, hypertension, hypercholesterolemia and obesity presents for evaluation of chest pain. Initial onset of pain was less than one hour ago. The patient's chest pain is described as heaviness/pressure/tightness and is not worse with exertion. The patient complains of nausea. The patient's chest pain is middle- or left-sided, is not well-localized, is not sharp and does radiate to the arms/jaw/neck. The  patient denies diaphoresis. The patient has no history of stroke, has no history of peripheral artery disease, has not smoked in the past 90 days and has no relevant family history of coronary artery disease (first degree relative at less than age 3).   Past Medical History:  Diagnosis Date  . Asthma   . Atrial fibrillation ([place])   . DDD (degenerative disc disease), lumbar   . Diabetes mellitus   . HLD (hyperlipidemia)   . Hypertension   . Obesity   . Sleep apnea     Patient Active Problem List   Diagnosis Date Noted  . Weakness 04/08/2015  . Diabetes mellitus without complication ([place]) [id number]  . Anxiety 04/07/2015  . Left-sided weakness 04/07/2015  . Hypertension   . Asthma   . DDD (degenerative disc disease), lumbar   . Obesity   . HLD (hyperlipidemia)   . Hypothyroidism   . Essential hypertension   . Atrial fibrillation ([place])   . Fibroids 03/01/2012  . ALLERGIC RHINITIS 07/05/2006  . LOW BACK PAIN, CHRONIC 07/05/2006  . BACK PAIN 07/05/2006  . DYSURIA 07/05/2006    Past Surgical History:  Procedure Laterality Date  . ABDOMINAL HYSTERECTOMY    . CYSTOSCOPY  02/29/2012   Procedure: CYSTOSCOPY;  Surgeon: Cheri Fowler, MD;  Location: [facility];  Service: Gynecology;  Laterality: N/A;  . LAPAROSCOPIC ASSISTED VAGINAL HYSTERECTOMY  02/29/2012   Procedure: LAPAROSCOPIC ASSISTED VAGINAL HYSTERECTOMY;  Surgeon: Cheri Fowler, MD;  Location: [facility];  Service: Gynecology;  Laterality: N/A;  . SALPINGOOPHORECTOMY  02/29/2012   Procedure: SALPINGO OOPHORECTOMY;  Surgeon: Cheri Fowler, MD;  Location: [facility];  Service: Gynecology;  Laterality:  Bilateral;  . uterine ablation    . VEIN SURGERY       OB History   No obstetric history on file.     Family History  Problem Relation Age of Onset  . Hypertension Brother   . Diabetes Brother     Social History   Tobacco Use  . Smoking status: Never Smoker  . Smokeless tobacco: Never Used  Vaping Use  . Vaping Use:  Never used  Substance Use Topics  . Alcohol use: No  . Drug use: No    Home Medications Prior to Admission medications   Medication Sig Start Date End Date Taking? Authorizing Provider  methocarbamol (ROBAXIN) 500 MG tablet Take 1 tablet (500 mg total) by mouth 2 (two) times daily. 07/03/20  Yes Yesmin Mutch, Druscilla Brownie, PA-C  acetaminophen (TYLENOL) 500 MG tablet Take 1 tablet (500 mg total) by mouth every 6 (six) hours as needed. 04/06/20   Laban Emperor, PA-C  albuterol (PROVENTIL HFA;VENTOLIN HFA) 108 (90 Base) MCG/ACT inhaler Inhale into the lungs.    [provider]  ALPRAZolam Duanne Moron) 0.25 MG tablet Take 0.25 mg by mouth at bedtime as needed. For anxiety    [provider]  amLODipine (NORVASC) 10 MG tablet Take 10 mg by mouth daily.    [provider]  atenolol (TENORMIN) 50 MG tablet Take 25 mg by mouth daily.    [provider]  azithromycin (ZITHROMAX Z-PAK) 250 MG tablet 2 po day one, then 1 daily x 4 days 06/02/16   Charlesetta Shanks, MD  Baclofen 5 MG TABS Take 5 mg by mouth 3 (three) times daily as needed. 04/06/20   Laban Emperor, PA-C  benzonatate (TESSALON) 100 MG capsule Take 1 capsule (100 mg total) by mouth every 8 (eight) hours. 06/02/16   Charlesetta Shanks, MD  cephALEXin (KEFLEX) 500 MG capsule Take 1 capsule (500 mg total) by mouth 4 (four) times daily. 08/06/17   Law, Bea Graff, PA-C  Cetirizine HCl (ZYRTEC ALLERGY) 10 MG CAPS Take 1 capsule (10 mg total) by mouth daily. 05/14/16   Blanchie Dessert, MD  Cholecalciferol (VITAMIN D PO) Take 1 tablet by mouth daily.    [provider]  diclofenac sodium (VOLTAREN) 1 % GEL Apply 4 g topically 4 (four) times daily. 07/15/15   Palumbo, April, MD  FLUoxetine HCl (PROZAC PO) Take by mouth.    [provider]  glipiZIDE (GLUCOTROL) 5 MG tablet Take 5 mg by mouth daily before breakfast.    [provider]  HYDROcodone-homatropine (HYCODAN) 5-1.5 MG/5ML syrup Take 5 mLs by mouth  every 6 (six) hours as needed for cough. 06/02/16   Charlesetta Shanks, MD  Insulin Lispro (HUMALOG [place]) Inject into the skin.    [provider]  loratadine (CLARITIN) 10 MG tablet Take 10 mg by mouth daily as needed for allergies.    [provider]  losartan (COZAAR) 100 MG tablet Take 100 mg by mouth daily.    [provider]  metFORMIN (GLUCOPHAGE) 1000 MG tablet Take 1,000 mg by mouth daily with breakfast.    [provider]  omeprazole (PRILOSEC) 20 MG capsule Take 1 capsule (20 mg total) by mouth daily. 02/01/16   Charlesetta Shanks, MD  predniSONE (DELTASONE) 20 MG tablet 3 tabs po daily x 3 days, then 2 tabs x 3 days, then 1.5 tabs x 3 days, then 1 tab x 3 days, then 0.5 tabs x 3 days 06/10/16   Orpah Greek, MD  ranitidine (  ZANTAC) 150 MG capsule Take 1 capsule (150 mg total) by mouth daily. 07/18/17   Fatima Blank, MD  thalidomide (THALOMID) 50 MG capsule Take 25 mg by mouth at bedtime. Take with water.    [provider]  tiZANidine (ZANAFLEX) 4 MG capsule Take 4 mg by mouth 3 (three) times daily.    [provider]    Allergies    Fluoxetine, Lisinopril, Sertraline, and Insulin glargine  Review of Systems   Review of Systems  Constitutional: Negative for chills and fever.  Eyes: Negative for visual disturbance.  Respiratory: Positive for shortness of breath (resolved). Negative for cough.   Cardiovascular: Positive for chest pain (resolved). Negative for leg swelling.  Gastrointestinal: Positive for nausea (resolved). Negative for abdominal pain.  Neurological: Positive for weakness. Negative for dizziness, facial asymmetry and speech difficulty.  All other systems reviewed and are negative.   Physical Exam Updated Vital Signs BP 125/84   Pulse 79   Temp 98.5 F (36.9 C) (Oral)   Resp 14   LMP 02/16/2012   SpO2 100%   Physical Exam Vitals and nursing note reviewed.  Constitutional:      General: She is  not in acute distress.    Appearance: She is not ill-appearing.  HENT:     Head: Normocephalic.  Eyes:     Pupils: Pupils are equal, round, and reactive to light.  Cardiovascular:     Rate and Rhythm: Normal rate and regular rhythm.     Pulses: Normal pulses.     Heart sounds: Normal heart sounds. No murmur heard. No friction rub. No gallop.   Pulmonary:     Effort: Pulmonary effort is normal.     Breath sounds: Normal breath sounds.  Abdominal:     General: Abdomen is flat. There is no distension.     Palpations: Abdomen is soft.     Tenderness: There is no abdominal tenderness. There is no guarding or rebound.  Musculoskeletal:        General: Normal range of motion.     Cervical back: Neck supple.     Comments: 1+ pitting edema bilaterally. Negative homan sign bilaterally. No calf tenderness.   Skin:    General: Skin is warm and dry.  Neurological:     General: No focal deficit present.     Mental Status: She is alert.     Comments: Speech is clear, able to follow commands CN III-XII intact Normal strength in upper and lower extremities bilaterally including dorsiflexion and plantar flexion, strong and equal grip strength Sensation grossly intact throughout Moves extremities without ataxia, coordination intact No pronator drift   Psychiatric:        Mood and Affect: Mood normal.        Behavior: Behavior normal.     ED Results / Procedures / Treatments   Labs (all labs ordered are listed, but only abnormal results are displayed) Labs Reviewed  BASIC METABOLIC PANEL - Abnormal; Notable for the following components:      Result Value   Glucose, Bld 143 (*)    Calcium 8.8 (*)    All other components within normal limits  CBG MONITORING, ED - Abnormal; Notable for the following components:   Glucose-Capillary 139 (*)    All other components within normal limits  CBC WITH DIFFERENTIAL/PLATELET  BRAIN NATRIURETIC PEPTIDE  TROPONIN I (HIGH SENSITIVITY)  TROPONIN I  (HIGH SENSITIVITY)    EKG EKG Interpretation  Date/Time:  Friday July 03 2020  10:43:41 EDT Ventricular Rate:  73 PR Interval:  151 QRS Duration: 97 QT Interval:  435 QTC Calculation: 480 R Axis:   -16 Text Interpretation: Sinus rhythm Left ventricular hypertrophy Inferior infarct, old Lateral leads are also involved inferior t wave changes more pronounced compared to last tracing Confirmed by Dorie Rank [phone]) on 07/03/2020 11:02:56 AM   Radiology DG Chest Portable 1 View  Result Date: 07/03/2020 CLINICAL DATA:  Chest pain EXAM: PORTABLE CHEST 1 VIEW COMPARISON:  04/06/2020 FINDINGS: Heart size mildly enlarged. Negative for heart failure. Lungs clear without infiltrate or effusion. IMPRESSION: No active disease. Electronically Signed   By: Franchot Gallo M.D.   On: 07/03/2020 11:21    Procedures Procedures   Medications Ordered in ED Medications - No data to display  ED Course  I have reviewed the triage vital signs and the nursing notes.  Pertinent labs & imaging results that were available during my care of the patient were reviewed by me and considered in my medical decision making (see chart for details).  Clinical Course as of 07/03/20 1401  Fri Jul 03, 2020  1122 Troponin I (High Sensitivity): <2 [CA]    Clinical Course User Index [CA] Suzy Bouchard, PA-C   MDM Rules/Calculators/A&P HEAR Score: 39                       55 year old female presents to the ED due to "muscle spasms" to her left upper and lower extremities that have been intermittent for the past 2 to 3 weeks however, worsened today while at physical therapy.  Patient also experienced central chest tightness upon arrival to the ED.  No previous MI or CVA.  She is currently chest pain-free.  Denies history of blood clots, recent surgeries, recent long immobilizations, and hormonal treatments.  Upon arrival, vitals all within normal limits.  Patient no acute distress and non-ill-appearing.  Physical  exam reassuring.  Lungs clear to auscultation bilaterally.  1+ pitting edema bilaterally.  Negative Homans' sign bilaterally.  No calf tenderness.  We will obtain routine labs to rule out electrolyte abnormalities given muscle spasms.  Cardiac labs ordered to rule out cardiac etiology.  CBC unremarkable no leukocytosis and normal hemoglobin.  Delta troponin flat.  Doubt ACS.  BMP significant for hyperglycemia 143 with no anion gap.  Normal renal function.  No major electrolyte derangements.  BNP normal.  Chest x-ray personally reviewed which is negative for signs of pneumonia, pneumothorax or widened mediastinum.  EKG personally reviewed which demonstrates normal sinus rhythm with nonspecific T wave abnormalities. No signs of DVT on exam. No tachycardia or hypoxia. Low suspicion for PE/DVT.  Dissection considered but thought to be less likely given presentation.  Patient has remained chest pain-free during her entire ED stay.  Patient discharged with muscle relaxer due to muscle spasms.  Advised patient to follow-up with PCP within the next week for further evaluation. Strict ED precautions discussed with patient. Patient states understanding and agrees to plan. Patient discharged home in no acute distress and stable vitals  Final Clinical Impression(s) / ED Diagnoses Final diagnoses:  Nonspecific chest pain  Muscle cramps    Rx / DC Orders ED Discharge Orders         Ordered    methocarbamol (ROBAXIN) 500 MG tablet  2 times daily        07/03/20 1357           Suzy Bouchard, [place] 07/03/20 1401  Dorie Rank, MD 07/04/20 0700

## 2020-07-03 NOTE — ED Triage Notes (Signed)
Pt was at physical therapy for her ankles , felt left chest heaviness, pressure radiating to left arm and spasm to left leg. Reports lethargy.

## 2020-07-03 NOTE — Therapy (Signed)
[facility]. [place], [place], [address] Phone: [phone]   Fax:  [phone]  Physical Therapy Treatment  Patient Details  Name: Tonya Quinn MRN: [id number] Date of Birth: 1965/11/05 Referring Provider (PT): Rip Harbour   Encounter Date: 07/03/2020   PT End of Session - 07/03/20 0806    Visit Number 13    Date for PT Re-Evaluation 07/31/20    Authorization Type UHC medicare    PT Start Time 0800    PT Stop Time 0840    PT Time Calculation (min) 40 min    Activity Tolerance Patient tolerated treatment well;Patient limited by pain    Behavior During Therapy [facility] for tasks assessed/performed           Past Medical History:  Diagnosis Date  . Asthma   . Atrial fibrillation ([place])   . DDD (degenerative disc disease), lumbar   . Diabetes mellitus   . HLD (hyperlipidemia)   . Hypertension   . Obesity   . Sleep apnea     Past Surgical History:  Procedure Laterality Date  . ABDOMINAL HYSTERECTOMY    . CYSTOSCOPY  02/29/2012   Procedure: CYSTOSCOPY;  Surgeon: Cheri Fowler, MD;  Location: [facility];  Service: Gynecology;  Laterality: N/A;  . LAPAROSCOPIC ASSISTED VAGINAL HYSTERECTOMY  02/29/2012   Procedure: LAPAROSCOPIC ASSISTED VAGINAL HYSTERECTOMY;  Surgeon: Cheri Fowler, MD;  Location: [facility];  Service: Gynecology;  Laterality: N/A;  . SALPINGOOPHORECTOMY  02/29/2012   Procedure: SALPINGO OOPHORECTOMY;  Surgeon: Cheri Fowler, MD;  Location: [facility];  Service: Gynecology;  Laterality: Bilateral;  . uterine ablation    . VEIN SURGERY      Vitals:   07/03/20 0811  BP: (!) 149/100     Subjective Assessment - 07/03/20 0803    Subjective Not too bad today. The ice relaly help. Slept a little better last night, may be because used frozen water bottle on feet last night.    Pertinent History uncontrolled T2DM, HTN, history of Afib, DJD, Asthma treated by inhaler. MRI L Knee 04/21/20: No meniscal or  ligamentous injury of the left knee. Partial-thickness cartilage loss of the medial trochlea and medial femorotibial compartment.  Moderate joint effusion.  Small leaking Baker's cyst.    Patient Stated Goals have less pain and walk better    Currently in Pain? Yes    Pain Score 5     Pain Location Heel   and ankle R   Pain Orientation Right;Left    Pain Descriptors / Indicators Aching;Sharp;Sore                             [facility] Adult PT Treatment/Exercise - 07/03/20 0001      Ankle Exercises: Seated   Ankle Circles/Pumps AROM;Both   2 x 10 reps   Other Seated Ankle Exercises Ankle Inv/Ev AROM x 10 B - red TB                    PT Short Term Goals - 07/01/20 0930      PT SHORT TERM GOAL #1   Title independent with updated HEP    Time 2    Period Weeks    Status New    Target Date 07/15/20             PT Long Term Goals - 07/01/20 0930      PT LONG TERM GOAL #1  Title Pt will have improved L Knee flexion ROM to at least 125 deg and </= 3/10 pain for improved functional mobility with gait and transfers    Time 4    Period Weeks    Status On-going   0-105 with 5/10 kne epain     PT LONG TERM GOAL #2   Title decrease pain to </= 3/10 with ADLs    Time 4    Period Weeks    Status On-going   ranging 3-6/10 when on feet for a long time, right ankle pressure/pain and left knee instability     PT LONG TERM GOAL #3   Title Pt will be able to walk without limping and with improved symmetry of steps and stance, </= 3/10 pain.    Time 4    Period Weeks    Status On-going   continued asymmetry of gait, antalgic     PT LONG TERM GOAL #4   Title BLE strength to at least 4/5    Time 4    Period Weeks    Status On-going   making gradual progress. Slight improvement in left knee/ankle and R ankle strength since initial eval     PT LONG TERM GOAL #5   Title B ankle ROM to at least 10 deg DF to 50 deg PF without pain    Time 4    Period Weeks     Status New                 Plan - 07/03/20 1610    Clinical Impression Statement After starting gentle ankle band exercise she reports left shoulder spasms/pain that had  started when she woke  this morning, BP assessed and elevated (reports not taking BP meds this morning, decided to go out to car to get BP meds). Decided to end session early d/t not feeling well, also c/o feeling weaker although functionally moving symmetrically R vs L.  Given high blood pressure and symptoms it was recommended that patient go to urgent care or ED and she contacted her son to pick her up. I spoke to her son and informed him of her presentaiton and findings and recommendation for ED/Urgent care.    Personal Factors and Comorbidities Comorbidity 3+;Past/Current Experience    Comorbidities uncontrolled T2DM, HTN, history of Afib, DJD, Asthma treated by inhaler.    Rehab Potential --    PT Frequency --    PT Duration --    PT Treatment/Interventions ADLs/Self Care Home Management;Cryotherapy;Electrical Stimulation;Iontophoresis 4mg /ml Dexamethasone;Moist Heat;Gait training;Neuromuscular re-education;Balance training;Therapeutic exercise;Therapeutic activities;Functional mobility training;Stair training;Patient/family education;Manual techniques;Taping    PT Next Visit Plan Follow up regarding BP and medical follow up after today. manual and modalities as needed. progress TE for LE strengthening, ankle/foot mobility. Pain and instability  with weightbearing so progress any standing exercises based on tolerance. If good standing tolerance do more standing strength and balance    Consulted and Agree with Plan of Care Patient;Other (Comment)   Spoke to Son on phone  regarding pt presentation          Patient will benefit from skilled therapeutic intervention in order to improve the following deficits and impairments:  Abnormal gait,Decreased range of motion,Difficulty walking,Decreased endurance,Decreased  activity tolerance,Pain,Impaired flexibility,Improper body mechanics,Decreased mobility,Decreased strength,Increased muscle spasms,Decreased balance  Visit Diagnosis: Muscle weakness (generalized)  Difficulty in walking, not elsewhere classified  Pain in right ankle and joints of right foot  Stiffness of right ankle, not elsewhere classified  Pain in left  foot  Stiffness of left foot, not elsewhere classified  Stiffness of left knee, not elsewhere classified  Acute pain of left knee  Pain in left hip  Other muscle spasm     Problem List Patient Active Problem List   Diagnosis Date Noted  . Weakness 04/08/2015  . Diabetes mellitus without complication ([place]) 08/67/6195  . Anxiety 04/07/2015  . Left-sided weakness 04/07/2015  . Hypertension   . Asthma   . DDD (degenerative disc disease), lumbar   . Obesity   . HLD (hyperlipidemia)   . Hypothyroidism   . Essential hypertension   . Atrial fibrillation ([place])   . Fibroids 03/01/2012  . ALLERGIC RHINITIS 07/05/2006  . LOW BACK PAIN, CHRONIC 07/05/2006  . BACK PAIN 07/05/2006  . DYSURIA 07/05/2006    Hall Busing, PT, DPT 07/03/2020, 11:50 AM  [facility]. [place], [place], [address] Phone: [phone]   Fax:  [phone]  Name: Tonya Quinn MRN: [id number] Date of Birth: 1965-05-12

## 2020-07-03 NOTE — Discharge Instructions (Addendum)
As discussed, all of your labs were reassuring today.  Your cardiac markers were normal.  I am sending you home with a muscle relaxer.  Take as needed.  Muscle relaxer can cause drowsiness so do not drive or operate machinery while on the medication.  Please follow-up with your PCP within the next week for further evaluation.  Return to the ER for new or worsening symptoms.

## 2020-07-06 ENCOUNTER — Ambulatory Visit: Payer: Medicare Other

## 2020-07-08 ENCOUNTER — Other Ambulatory Visit: Payer: Self-pay

## 2020-07-08 ENCOUNTER — Ambulatory Visit: Payer: Medicare Other

## 2020-07-08 DIAGNOSIS — M25675 Stiffness of left foot, not elsewhere classified: Secondary | ICD-10-CM

## 2020-07-08 DIAGNOSIS — M25571 Pain in right ankle and joints of right foot: Secondary | ICD-10-CM

## 2020-07-08 DIAGNOSIS — M25671 Stiffness of right ankle, not elsewhere classified: Secondary | ICD-10-CM

## 2020-07-08 DIAGNOSIS — M79672 Pain in left foot: Secondary | ICD-10-CM

## 2020-07-08 DIAGNOSIS — M6281 Muscle weakness (generalized): Secondary | ICD-10-CM

## 2020-07-08 DIAGNOSIS — M25662 Stiffness of left knee, not elsewhere classified: Secondary | ICD-10-CM

## 2020-07-08 DIAGNOSIS — M25552 Pain in left hip: Secondary | ICD-10-CM | POA: Diagnosis not present

## 2020-07-08 DIAGNOSIS — M62838 Other muscle spasm: Secondary | ICD-10-CM

## 2020-07-08 DIAGNOSIS — R262 Difficulty in walking, not elsewhere classified: Secondary | ICD-10-CM

## 2020-07-08 DIAGNOSIS — M25562 Pain in left knee: Secondary | ICD-10-CM

## 2020-07-08 NOTE — Therapy (Signed)
[address]. [place], [place], [address] Phone: [phone]   Fax:  [phone]  Physical Therapy Treatment  Patient Details  Name: Tonya Quinn MRN: [id number] Date of Birth: 1965/04/06 Referring Provider (PT): Tonya Quinn   Encounter Date: 07/08/2020   PT End of Session - 07/08/20 0819    Visit Number 14    Number of Visits 17    Date for PT Re-Evaluation 07/31/20    Authorization Type UHC medicare    PT Start Time 0825    PT Stop Time 0907    PT Time Calculation (min) 42 min    Activity Tolerance Patient tolerated treatment well;Patient limited by pain    Behavior During Therapy [facility] for tasks assessed/performed           Past Medical History:  Diagnosis Date  . Asthma   . Atrial fibrillation ([place]a Quinn)   . DDD (degenerative disc disease), lumbar   . Diabetes mellitus   . HLD (hyperlipidemia)   . Hypertension   . Obesity   . Sleep apnea     Past Surgical History:  Procedure Laterality Date  . ABDOMINAL HYSTERECTOMY    . CYSTOSCOPY  02/29/2012   Procedure: CYSTOSCOPY;  Surgeon: Tonya Fowler, MD;  Location: [facility];  Service: Gynecology;  Laterality: N/A;  . LAPAROSCOPIC ASSISTED VAGINAL HYSTERECTOMY  02/29/2012   Procedure: LAPAROSCOPIC ASSISTED VAGINAL HYSTERECTOMY;  Surgeon: Tonya Fowler, MD;  Location: [facility];  Service: Gynecology;  Laterality: N/A;  . SALPINGOOPHORECTOMY  02/29/2012   Procedure: SALPINGO OOPHORECTOMY;  Surgeon: Tonya Fowler, MD;  Location: [facility];  Service: Gynecology;  Laterality: Bilateral;  . uterine ablation    . VEIN SURGERY      There were no vitals filed for this visit.   Subjective Assessment - 07/08/20 0826    Subjective Doing okay. was checked at ED and everything was okay - no heart issues, signs of stroke. She reports blood sugar better this morning and thinks that is playing a role in how she feels.    Currently in Pain? No/denies   feeling really good today  but yesterday R ankle and L foot in alot of pain.             [facility] Adult PT Treatment/Exercise - 07/08/20 0001      Exercises   Exercises Knee/Hip;Ankle      Knee/Hip Exercises: Stretches   Gastroc Stretch 3 reps;30 seconds;Right;Left      Knee/Hip Exercises: Standing   Other Standing Knee Exercises Sit to stand rom elevated mat table on airex - ski poles for stability in standing as needed - x 5.    Weight shifts L/R in standing on airex 5 reps x 5      Cryotherapy   Number Minutes Cryotherapy 5 Minutes   right lateral ankle, left sole of foot/heel   Type of Cryotherapy --   ice pack R ankle, ice massage using probe for left sole of foot/heel     Manual Therapy   Manual Therapy Joint mobilization;Soft tissue mobilization    Manual therapy comments manual ankle stretching long duration vs contract relax x 3 bilateral. gentle MT mobs, toe spreading. STM to plantar fascia and achilles.      Ankle Exercises: Seated   ABC's 1 rep   bilateral   Other Seated Ankle Exercises Ankle DF/Inv/Ev AROM 2x 10 B - red TB. x 10 PF. Bilateral  PT Short Term Goals - 07/01/20 0930      PT SHORT TERM GOAL #1   Title independent with updated HEP    Time 2    Period Weeks    Status New    Target Date 07/15/20             PT Long Term Goals - 07/01/20 0930      PT LONG TERM GOAL #1   Title Pt will have improved L Knee flexion ROM to at least 125 deg and </= 3/10 pain for improved functional mobility with gait and transfers    Time 4    Period Weeks    Status On-going   0-105 with 5/10 kne epain     PT LONG TERM GOAL #2   Title decrease pain to </= 3/10 with ADLs    Time 4    Period Weeks    Status On-going   ranging 3-6/10 when on feet for a long time, right ankle pressure/pain and left knee instability     PT LONG TERM GOAL #3   Title Pt will be able to walk without limping and with improved symmetry of steps and stance, </= 3/10 pain.    Time 4     Period Weeks    Status On-going   continued asymmetry of gait, antalgic     PT LONG TERM GOAL #4   Title BLE strength to at least 4/5    Time 4    Period Weeks    Status On-going   making gradual progress. Slight improvement in left knee/ankle and R ankle strength since initial eval     PT LONG TERM GOAL #5   Title B ankle ROM to at least 10 deg DF to 50 deg PF without pain    Time 4    Period Weeks    Status New                 Plan - 07/08/20 0820    Clinical Impression Statement [name] tolerated all exercises nicely today. Was able to tolerate some elevated sit to stands on airex with weight shifts in standing to work on gentle ankle proprioception. She continues to have ankle tightness B requiring cueing with stretching to prevent compensatory ankle EV/lateral ankle collapse/compression.    Personal Factors and Comorbidities Comorbidity 3+;Past/Current Experience    Comorbidities uncontrolled T2DM, HTN, history of Afib, DJD, Asthma treated by inhaler.    Rehab Potential Good    PT Frequency 2x / week    PT Duration 4 weeks    PT Treatment/Interventions ADLs/Self Care Home Management;Cryotherapy;Electrical Stimulation;Iontophoresis 4mg /ml Dexamethasone;Moist Heat;Gait training;Neuromuscular re-education;Balance training;Therapeutic exercise;Therapeutic activities;Functional mobility training;Stair training;Patient/family education;Manual techniques;Taping    PT Next Visit Plan manual and modalities as needed. progress TE for LE strengthening, ankle/foot mobility. Pain and instability  with weightbearing so progress any standing exercises based on tolerance. If good standing tolerance do more standing strength and balance    PT Home Exercise Plan Educated on increasing freq of stretches as tolerated. Weight shifts and/or side stpeping at counter to work on improving tolerance to controlled WS/WB. Continue ice pack/frozen waterbottle to ankle/feet    Consulted and Agree with  Plan of Care Patient;Other (Comment)   Spoke to Son on phone  regarding pt presentation          Patient will benefit from skilled therapeutic intervention in order to improve the following deficits and impairments:  Abnormal gait,Decreased range of motion,Difficulty walking,Decreased endurance,Decreased activity  tolerance,Pain,Impaired flexibility,Improper body mechanics,Decreased mobility,Decreased strength,Increased muscle spasms,Decreased balance  Visit Diagnosis: Muscle weakness (generalized)  Difficulty in walking, not elsewhere classified  Pain in right ankle and joints of right foot  Stiffness of right ankle, not elsewhere classified  Pain in left foot  Stiffness of left foot, not elsewhere classified  Stiffness of left knee, not elsewhere classified  Acute pain of left knee  Pain in left hip  Other muscle spasm     Problem List Patient Active Problem List   Diagnosis Date Noted  . Weakness 04/08/2015  . Diabetes mellitus without complication ([place]) 47/42/5956  . Anxiety 04/07/2015  . Left-sided weakness 04/07/2015  . Hypertension   . Asthma   . DDD (degenerative disc disease), lumbar   . Obesity   . HLD (hyperlipidemia)   . Hypothyroidism   . Essential hypertension   . Atrial fibrillation ([place])   . Fibroids 03/01/2012  . ALLERGIC RHINITIS 07/05/2006  . LOW BACK PAIN, CHRONIC 07/05/2006  . BACK PAIN 07/05/2006  . DYSURIA 07/05/2006    Hall Busing, PT, DPT 07/08/2020, 9:16 AM  [facility]. [place], [place], [address] Phone: [phone]   Fax:  [phone]  Name: Tonya Quinn MRN: [id number] Date of Birth: Dec 06, 1965

## 2020-07-13 ENCOUNTER — Other Ambulatory Visit: Payer: Self-pay

## 2020-07-13 ENCOUNTER — Ambulatory Visit: Payer: Medicare Other | Attending: Family Medicine

## 2020-07-13 DIAGNOSIS — R262 Difficulty in walking, not elsewhere classified: Secondary | ICD-10-CM | POA: Insufficient documentation

## 2020-07-13 DIAGNOSIS — M62838 Other muscle spasm: Secondary | ICD-10-CM | POA: Insufficient documentation

## 2020-07-13 DIAGNOSIS — M79672 Pain in left foot: Secondary | ICD-10-CM | POA: Diagnosis present

## 2020-07-13 DIAGNOSIS — M6281 Muscle weakness (generalized): Secondary | ICD-10-CM | POA: Diagnosis present

## 2020-07-13 DIAGNOSIS — M25562 Pain in left knee: Secondary | ICD-10-CM | POA: Diagnosis present

## 2020-07-13 DIAGNOSIS — M25671 Stiffness of right ankle, not elsewhere classified: Secondary | ICD-10-CM | POA: Diagnosis present

## 2020-07-13 DIAGNOSIS — M25675 Stiffness of left foot, not elsewhere classified: Secondary | ICD-10-CM | POA: Diagnosis present

## 2020-07-13 DIAGNOSIS — M25552 Pain in left hip: Secondary | ICD-10-CM | POA: Diagnosis present

## 2020-07-13 DIAGNOSIS — M25571 Pain in right ankle and joints of right foot: Secondary | ICD-10-CM | POA: Diagnosis present

## 2020-07-13 DIAGNOSIS — M25662 Stiffness of left knee, not elsewhere classified: Secondary | ICD-10-CM | POA: Diagnosis present

## 2020-07-13 NOTE — Therapy (Signed)
[facility]. [place], [place], [address] Phone: [phone]   Fax:  [phone]  Physical Therapy Treatment  Patient Details  Name: Tonya Quinn MRN: [id number] Date of Birth: February 26, 1966 Referring Provider (PT): Rip Harbour   Encounter Date: 07/13/2020   PT End of Session - 07/13/20 0810    Visit Number 15    Date for PT Re-Evaluation 07/31/20    Authorization Type UHC medicare    PT Start Time 0803    PT Stop Time 0845    PT Time Calculation (min) 42 min    Activity Tolerance Patient tolerated treatment well;Patient limited by pain    Behavior During Therapy [facility] for tasks assessed/performed           Past Medical History:  Diagnosis Date  . Asthma   . Atrial fibrillation ([place])   . DDD (degenerative disc disease), lumbar   . Diabetes mellitus   . HLD (hyperlipidemia)   . Hypertension   . Obesity   . Sleep apnea     Past Surgical History:  Procedure Laterality Date  . ABDOMINAL HYSTERECTOMY    . CYSTOSCOPY  02/29/2012   Procedure: CYSTOSCOPY;  Surgeon: Cheri Fowler, MD;  Location: [facility];  Service: Gynecology;  Laterality: N/A;  . LAPAROSCOPIC ASSISTED VAGINAL HYSTERECTOMY  02/29/2012   Procedure: LAPAROSCOPIC ASSISTED VAGINAL HYSTERECTOMY;  Surgeon: Cheri Fowler, MD;  Location: [facility];  Service: Gynecology;  Laterality: N/A;  . SALPINGOOPHORECTOMY  02/29/2012   Procedure: SALPINGO OOPHORECTOMY;  Surgeon: Cheri Fowler, MD;  Location: [facility];  Service: Gynecology;  Laterality: Bilateral;  . uterine ablation    . VEIN SURGERY      There were no vitals filed for this visit.   Subjective Assessment - 07/13/20 0807    Subjective ice and stretching seems to be the only things that help. ankle okay to day but heel hurting bad    Pertinent History uncontrolled T2DM, HTN, history of Afib, DJD, Asthma treated by inhaler. MRI L Knee 04/21/20: No meniscal or ligamentous injury of the left knee.  Partial-thickness cartilage loss of the medial trochlea and medial femorotibial compartment.  Moderate joint effusion.  Small leaking Baker's cyst.    Currently in Pain? Yes    Pain Score 5     Pain Location Heel    Pain Orientation Left    Pain Descriptors / Indicators Sore                             [facility] Adult PT Treatment/Exercise - 07/13/20 0001      Knee/Hip Exercises: Stretches   Gastroc Stretch 3 reps;30 seconds;Right;Left   manual, active assist     Knee/Hip Exercises: Standing   Other Standing Knee Exercises Sit to stand from elevated mat table on airex - ski poles for stability in standing as needed - x 2.  Weight shifts L/R in standing on airex 2 reps x 5      Modalities   Modalities Cryotherapy;Electrical Stimulation      Cryotherapy   Number Minutes Cryotherapy 10 Minutes   right ankle, left heel   Type of Cryotherapy Ice pack      Electrical Stimulation   Electrical Stimulation Location Right ankle    Electrical Stimulation Action premod- 10 min    Electrical Stimulation Goals Pain;Tone      Ankle Exercises: Stretches   Soleus Stretch 5 reps;20 seconds   bilateral  Ankle Exercises: Seated   Ankle Circles/Pumps AROM;Both;10 reps    Other Seated Ankle Exercises Ankle DF/Inv/Ev AROMx15 B - red TB. x 15 PF. Bilateral                    PT Short Term Goals - 07/01/20 0930      PT SHORT TERM GOAL #1   Title independent with updated HEP    Time 2    Period Weeks    Status New    Target Date 07/15/20             PT Long Term Goals - 07/01/20 0930      PT LONG TERM GOAL #1   Title Pt will have improved L Knee flexion ROM to at least 125 deg and </= 3/10 pain for improved functional mobility with gait and transfers    Time 4    Period Weeks    Status On-going   0-105 with 5/10 kne epain     PT LONG TERM GOAL #2   Title decrease pain to </= 3/10 with ADLs    Time 4    Period Weeks    Status On-going   ranging 3-6/10  when on feet for a long time, right ankle pressure/pain and left knee instability     PT LONG TERM GOAL #3   Title Pt will be able to walk without limping and with improved symmetry of steps and stance, </= 3/10 pain.    Time 4    Period Weeks    Status On-going   continued asymmetry of gait, antalgic     PT LONG TERM GOAL #4   Title BLE strength to at least 4/5    Time 4    Period Weeks    Status On-going   making gradual progress. Slight improvement in left knee/ankle and R ankle strength since initial eval     PT LONG TERM GOAL #5   Title B ankle ROM to at least 10 deg DF to 50 deg PF without pain    Time 4    Period Weeks    Status New                 Plan - 07/13/20 0263    Clinical Impression Statement SSandra limited by pain for exercises today, asking after end of first set to be done with each exercise. Able to tolerate limited elevated sit to stands on airex with weight shifts in standing to work on gentle ankle proprioception. She continues to have ankle tightness B requiring cueing with stretching to prevent compensatory ankle EV/lateral ankle collapse/compression. offered modalities end of session to help with pain management and these were tolerated nicely.    Personal Factors and Comorbidities Comorbidity 3+;Past/Current Experience    Comorbidities uncontrolled T2DM, HTN, history of Afib, DJD, Asthma treated by inhaler.    Rehab Potential Good    PT Frequency 2x / week    PT Duration 4 weeks    PT Treatment/Interventions ADLs/Self Care Home Management;Cryotherapy;Electrical Stimulation;Iontophoresis 4mg /ml Dexamethasone;Moist Heat;Gait training;Neuromuscular re-education;Balance training;Therapeutic exercise;Therapeutic activities;Functional mobility training;Stair training;Patient/family education;Manual techniques;Taping    PT Next Visit Plan manual and modalities as needed. progress TE for LE strengthening, ankle/foot mobility. Pain and instability  with  weightbearing so progress any standing exercises based on tolerance. If good standing tolerance do more standing strength and balance    PT Home Exercise Plan Educated on increasing freq of stretches as tolerated. Weight shifts and/or side stpeping  at counter to work on improving tolerance to controlled WS/WB. Continue ice pack/frozen waterbottle to ankle/feet    Consulted and Agree with Plan of Care Patient           Patient will benefit from skilled therapeutic intervention in order to improve the following deficits and impairments:  Abnormal gait,Decreased range of motion,Difficulty walking,Decreased endurance,Decreased activity tolerance,Pain,Impaired flexibility,Improper body mechanics,Decreased mobility,Decreased strength,Increased muscle spasms,Decreased balance  Visit Diagnosis: Difficulty in walking, not elsewhere classified  Pain in right ankle and joints of right foot  Muscle weakness (generalized)  Stiffness of right ankle, not elsewhere classified  Pain in left foot  Stiffness of left foot, not elsewhere classified  Stiffness of left knee, not elsewhere classified  Other muscle spasm     Problem List Patient Active Problem List   Diagnosis Date Noted  . Weakness 04/08/2015  . Diabetes mellitus without complication ([place]) 53/66/4403  . Anxiety 04/07/2015  . Left-sided weakness 04/07/2015  . Hypertension   . Asthma   . DDD (degenerative disc disease), lumbar   . Obesity   . HLD (hyperlipidemia)   . Hypothyroidism   . Essential hypertension   . Atrial fibrillation ([place])   . Fibroids 03/01/2012  . ALLERGIC RHINITIS 07/05/2006  . LOW BACK PAIN, CHRONIC 07/05/2006  . BACK PAIN 07/05/2006  . DYSURIA 07/05/2006    Hall Busing, PT, DPT 07/13/2020, 8:43 AM  [facility]. [place], [place], [address] Phone: [phone]   Fax:  [phone]  Name: Naleigha Raimondi MRN: [id number] Date of  Birth: June 03, 1965

## 2020-07-17 ENCOUNTER — Ambulatory Visit: Payer: Medicare Other

## 2020-07-17 ENCOUNTER — Other Ambulatory Visit: Payer: Self-pay

## 2020-07-17 DIAGNOSIS — R262 Difficulty in walking, not elsewhere classified: Secondary | ICD-10-CM | POA: Diagnosis not present

## 2020-07-17 DIAGNOSIS — M79672 Pain in left foot: Secondary | ICD-10-CM

## 2020-07-17 DIAGNOSIS — M25671 Stiffness of right ankle, not elsewhere classified: Secondary | ICD-10-CM

## 2020-07-17 DIAGNOSIS — M62838 Other muscle spasm: Secondary | ICD-10-CM

## 2020-07-17 DIAGNOSIS — M6281 Muscle weakness (generalized): Secondary | ICD-10-CM

## 2020-07-17 DIAGNOSIS — M25675 Stiffness of left foot, not elsewhere classified: Secondary | ICD-10-CM

## 2020-07-17 DIAGNOSIS — M25571 Pain in right ankle and joints of right foot: Secondary | ICD-10-CM

## 2020-07-17 DIAGNOSIS — M25662 Stiffness of left knee, not elsewhere classified: Secondary | ICD-10-CM

## 2020-07-17 NOTE — Therapy (Signed)
[place]. [place], [place], [address] Phone: [phone]   Fax:  [phone]  Physical Therapy Treatment  Patient Details  Name: Tonya Quinn MRN: [id number] Date of Birth: 1965/07/06 Referring Provider (PT): Rip Harbour   Encounter Date: 07/17/2020   PT End of Session - 07/17/20 0835    Visit Number 16    Date for PT Re-Evaluation 07/31/20    Authorization Type UHC medicare    PT Start Time 0800    PT Stop Time 0834    PT Time Calculation (min) 34 min    Activity Tolerance Patient tolerated treatment well;Patient limited by pain    Behavior During Therapy [facility] for tasks assessed/performed           Past Medical History:  Diagnosis Date  . Asthma   . Atrial fibrillation ([place])   . DDD (degenerative disc disease), lumbar   . Diabetes mellitus   . HLD (hyperlipidemia)   . Hypertension   . Obesity   . Sleep apnea     Past Surgical History:  Procedure Laterality Date  . ABDOMINAL HYSTERECTOMY    . CYSTOSCOPY  02/29/2012   Procedure: CYSTOSCOPY;  Surgeon: Cheri Fowler, MD;  Location: [facility];  Service: Gynecology;  Laterality: N/A;  . LAPAROSCOPIC ASSISTED VAGINAL HYSTERECTOMY  02/29/2012   Procedure: LAPAROSCOPIC ASSISTED VAGINAL HYSTERECTOMY;  Surgeon: Cheri Fowler, MD;  Location: [facility];  Service: Gynecology;  Laterality: N/A;  . SALPINGOOPHORECTOMY  02/29/2012   Procedure: SALPINGO OOPHORECTOMY;  Surgeon: Cheri Fowler, MD;  Location: [facility];  Service: Gynecology;  Laterality: Bilateral;  . uterine ablation    . VEIN SURGERY      There were no vitals filed for this visit.   Subjective Assessment - 07/17/20 0802    Subjective had some more pain after exercises last visit. pain in the knee, ankle and the foot today. Saw [facility] monday and got shots in both heels and right ankle  but still with pain. Just want to do ice today.    Pertinent History uncontrolled T2DM, HTN, history of Afib, DJD,  Asthma treated by inhaler. MRI L Knee 04/21/20: No meniscal or ligamentous injury of the left knee. Partial-thickness cartilage loss of the medial trochlea and medial femorotibial compartment.  Moderate joint effusion.  Small leaking Baker's cyst.    Currently in Pain? Yes    Pain Score 5                              [facility] Adult PT Treatment/Exercise - 07/17/20 0001      Cryotherapy   Type of Cryotherapy Ice massage   ice massage with stretching to bilateral  plantar fascia, lateral right ankle along peroneals     Manual Therapy   Manual therapy comments manual ankle stretching long duration vs contract relax x 3 bilateral. gentle MT mobs, toe spreading. STM to plantar fascia, inferior lateral heels,  and achilles.                  PT Education - 07/17/20 0831    Education Details Educated on importance of mms strengthening to help support joints. Use of a small ball on the floor to roll on plantar surface of heel for self mobilization/massage. Self plantar fascia stretch in sitting. Discussed qualities of supportive insoles for shoes to help with pain. Also educated in use of a mirror on the floor while in sitting  to monitor skin integrity on soles of feet and check for any wounds, skin changes.    Person(s) Educated Patient    Methods Explanation;Demonstration    Comprehension Verbalized understanding            PT Short Term Goals - 07/01/20 0930      PT SHORT TERM GOAL #1   Title independent with updated HEP    Time 2    Period Weeks    Status New    Target Date 07/15/20             PT Long Term Goals - 07/01/20 0930      PT LONG TERM GOAL #1   Title Pt will have improved L Knee flexion ROM to at least 125 deg and </= 3/10 pain for improved functional mobility with gait and transfers    Time 4    Period Weeks    Status On-going   0-105 with 5/10 kne epain     PT LONG TERM GOAL #2   Title decrease pain to </= 3/10 with ADLs    Time 4     Period Weeks    Status On-going   ranging 3-6/10 when on feet for a long time, right ankle pressure/pain and left knee instability     PT LONG TERM GOAL #3   Title Pt will be able to walk without limping and with improved symmetry of steps and stance, </= 3/10 pain.    Time 4    Period Weeks    Status On-going   continued asymmetry of gait, antalgic     PT LONG TERM GOAL #4   Title BLE strength to at least 4/5    Time 4    Period Weeks    Status On-going   making gradual progress. Slight improvement in left knee/ankle and R ankle strength since initial eval     PT LONG TERM GOAL #5   Title B ankle ROM to at least 10 deg DF to 50 deg PF without pain    Time 4    Period Weeks    Status New                 Plan - 07/17/20 0835    Clinical Impression Statement Tonya Quinn continues to be limited by pain for exercises, starting session declining any exercises and asking for only ice. She was educated in [place] of mms strengthening to support joints and after discussion was in agreement for some manual stretching and ice massage for today. Given minimal change in progress we did discuss possible decrease to 1x/wk for remainder of POC given low tolerance to exercises in session and plan to revisit this next visit. End of session pt reported feeling improvement in heel and ankle pain after interventions.    Personal Factors and Comorbidities Comorbidity 3+;Past/Current Experience    Comorbidities uncontrolled T2DM, HTN, history of Afib, DJD, Asthma treated by inhaler.    Rehab Potential Good    PT Frequency 2x / week    PT Duration 4 weeks    PT Treatment/Interventions ADLs/Self Care Home Management;Cryotherapy;Electrical Stimulation;Iontophoresis 4mg /ml Dexamethasone;Moist Heat;Gait training;Neuromuscular re-education;Balance training;Therapeutic exercise;Therapeutic activities;Functional mobility training;Stair training;Patient/family education;Manual techniques;Taping    PT Next Visit  Plan manual and modalities as needed. progress TE for LE strengthening, ankle/foot mobility. Pain and instability  with weightbearing so progress any standing exercises based on tolerance. If good standing tolerance do more standing strength and balance. Possible der to1x/wk for remainder of POC.  PT Home Exercise Plan Educated on increasing freq of stretches as tolerated.  Continue ice pack/frozen waterbottle to ankle/feet    Consulted and Agree with Plan of Care Patient           Patient will benefit from skilled therapeutic intervention in order to improve the following deficits and impairments:  Abnormal gait,Decreased range of motion,Difficulty walking,Decreased endurance,Decreased activity tolerance,Pain,Impaired flexibility,Improper body mechanics,Decreased mobility,Decreased strength,Increased muscle spasms,Decreased balance  Visit Diagnosis: Difficulty in walking, not elsewhere classified  Other muscle spasm  Pain in right ankle and joints of right foot  Muscle weakness (generalized)  Stiffness of right ankle, not elsewhere classified  Pain in left foot  Stiffness of left foot, not elsewhere classified  Stiffness of left knee, not elsewhere classified     Problem List Patient Active Problem List   Diagnosis Date Noted  . Weakness 04/08/2015  . Diabetes mellitus without complication ([place]) 41/74/0814  . Anxiety 04/07/2015  . Left-sided weakness 04/07/2015  . Hypertension   . Asthma   . DDD (degenerative disc disease), lumbar   . Obesity   . HLD (hyperlipidemia)   . Hypothyroidism   . Essential hypertension   . Atrial fibrillation ([place])   . Fibroids 03/01/2012  . ALLERGIC RHINITIS 07/05/2006  . LOW BACK PAIN, CHRONIC 07/05/2006  . BACK PAIN 07/05/2006  . DYSURIA 07/05/2006    Hall Busing, PT, DPT 07/17/2020, 8:42 AM  [place]. [place], [place], [address] Phone: [phone]   Fax:   [phone]  Name: Tonya Quinn MRN: [id number] Date of Birth: 01/03/1966

## 2020-07-21 ENCOUNTER — Other Ambulatory Visit: Payer: Self-pay

## 2020-07-21 ENCOUNTER — Ambulatory Visit: Payer: Medicare Other

## 2020-07-21 DIAGNOSIS — M6281 Muscle weakness (generalized): Secondary | ICD-10-CM

## 2020-07-21 DIAGNOSIS — M25675 Stiffness of left foot, not elsewhere classified: Secondary | ICD-10-CM

## 2020-07-21 DIAGNOSIS — R262 Difficulty in walking, not elsewhere classified: Secondary | ICD-10-CM | POA: Diagnosis not present

## 2020-07-21 DIAGNOSIS — M25671 Stiffness of right ankle, not elsewhere classified: Secondary | ICD-10-CM

## 2020-07-21 DIAGNOSIS — M25571 Pain in right ankle and joints of right foot: Secondary | ICD-10-CM

## 2020-07-21 DIAGNOSIS — M79672 Pain in left foot: Secondary | ICD-10-CM

## 2020-07-21 DIAGNOSIS — M62838 Other muscle spasm: Secondary | ICD-10-CM

## 2020-07-21 NOTE — Therapy (Signed)
[place]. [place], [place], [address] Phone: [phone]   Fax:  [phone]  Physical Therapy Treatment  Patient Details  Name: Tonya Quinn MRN: [id number] Date of Birth: January 27, 1966 Referring Provider (PT): Rip Harbour   Encounter Date: 07/21/2020   PT End of Session - 07/21/20 0849    Visit Number 17    Date for PT Re-Evaluation 07/31/20    Authorization Type UHC medicare    PT Start Time 0845    PT Stop Time 0926    PT Time Calculation (min) 41 min    Activity Tolerance Patient tolerated treatment well;Patient limited by pain    Behavior During Therapy [facility] for tasks assessed/performed           Past Medical History:  Diagnosis Date  . Asthma   . Atrial fibrillation ([place])   . DDD (degenerative disc disease), lumbar   . Diabetes mellitus   . HLD (hyperlipidemia)   . Hypertension   . Obesity   . Sleep apnea     Past Surgical History:  Procedure Laterality Date  . ABDOMINAL HYSTERECTOMY    . CYSTOSCOPY  02/29/2012   Procedure: CYSTOSCOPY;  Surgeon: Cheri Fowler, MD;  Location: [facility];  Service: Gynecology;  Laterality: N/A;  . LAPAROSCOPIC ASSISTED VAGINAL HYSTERECTOMY  02/29/2012   Procedure: LAPAROSCOPIC ASSISTED VAGINAL HYSTERECTOMY;  Surgeon: Cheri Fowler, MD;  Location: [facility];  Service: Gynecology;  Laterality: N/A;  . SALPINGOOPHORECTOMY  02/29/2012   Procedure: SALPINGO OOPHORECTOMY;  Surgeon: Cheri Fowler, MD;  Location: [facility];  Service: Gynecology;  Laterality: Bilateral;  . uterine ablation    . VEIN SURGERY      There were no vitals filed for this visit.   Subjective Assessment - 07/21/20 0847    Subjective Had a good weekend, hardly any pain today only with pressure    Pertinent History uncontrolled T2DM, HTN, history of Afib, DJD, Asthma treated by inhaler. MRI L Knee 04/21/20: No meniscal or ligamentous injury of the left knee. Partial-thickness cartilage loss of the medial  trochlea and medial femorotibial compartment.  Moderate joint effusion.  Small leaking Baker's cyst.    Currently in Pain? No/denies                             [facility] Adult PT Treatment/Exercise - 07/21/20 0001      Exercises   Exercises Knee/Hip;Ankle      Knee/Hip Exercises: Stretches   Gastroc Stretch 4 reps;20 seconds   standing at bar     Knee/Hip Exercises: Seated   Sit to [employer] 5 reps   from airex , with standing balance each rep     Cryotherapy   Type of Cryotherapy Ice massage   ice massage with stretching to bilateral  plantar fascia, lateral right ankle along peroneals     Manual Therapy   Manual therapy comments manual ankle stretching long duration vs contract relax x 3 bilateral. gentle MT mobs, toe spreading. STM to plantar fascia, inferior lateral heels,  and achilles.      Ankle Exercises: Seated   Ankle Circles/Pumps Both;Strengthening;20 reps    Towel Crunch --   x30 reps each side - very difficult to complete     Ankle Exercises: Standing   Toe Raise --   seated 3 x 10 toe plantarflexion against yellow TB, heel on ground  PT Short Term Goals - 07/01/20 0930      PT SHORT TERM GOAL #1   Title independent with updated HEP    Time 2    Period Weeks    Status New    Target Date 07/15/20             PT Long Term Goals - 07/01/20 0930      PT LONG TERM GOAL #1   Title Pt will have improved L Knee flexion ROM to at least 125 deg and </= 3/10 pain for improved functional mobility with gait and transfers    Time 4    Period Weeks    Status On-going   0-105 with 5/10 kne epain     PT LONG TERM GOAL #2   Title decrease pain to </= 3/10 with ADLs    Time 4    Period Weeks    Status On-going   ranging 3-6/10 when on feet for a long time, right ankle pressure/pain and left knee instability     PT LONG TERM GOAL #3   Title Pt will be able to walk without limping and with improved symmetry of steps and stance,  </= 3/10 pain.    Time 4    Period Weeks    Status On-going   continued asymmetry of gait, antalgic     PT LONG TERM GOAL #4   Title BLE strength to at least 4/5    Time 4    Period Weeks    Status On-going   making gradual progress. Slight improvement in left knee/ankle and R ankle strength since initial eval     PT LONG TERM GOAL #5   Title B ankle ROM to at least 10 deg DF to 50 deg PF without pain    Time 4    Period Weeks    Status New                 Plan - 07/21/20 6045    Clinical Impression Statement Less pain today. Pt able to tolerate a few exercises today in addition to standing gastroc stretches. Educated extensively throughout session regarding importance of maintaining consistency with HEP to work on strength and mobility improvements, in addition to education regarding importance of blood sugar management (advised to follow up with MD regarding continued issues with blood sugar control).  Plan to continue scheduled PT sessions to end of POC followed by DC to HEP    Personal Factors and Comorbidities Comorbidity 3+;Past/Current Experience    Comorbidities uncontrolled T2DM, HTN, history of Afib, DJD, Asthma treated by inhaler.    Rehab Potential Good    PT Frequency 2x / week    PT Duration 4 weeks    PT Treatment/Interventions ADLs/Self Care Home Management;Cryotherapy;Electrical Stimulation;Iontophoresis 4mg /ml Dexamethasone;Moist Heat;Gait training;Neuromuscular re-education;Balance training;Therapeutic exercise;Therapeutic activities;Functional mobility training;Stair training;Patient/family education;Manual techniques;Taping    PT Next Visit Plan manual and modalities as needed. progress TE for LE strengthening, ankle/foot mobility. Pain and instability  with weightbearing so progress any standing exercises based on tolerance. If good standing tolerance do more standing strength and balance.    PT Home Exercise Plan Educated on increasing freq of stretches as  tolerated.  Continue ice pack/frozen waterbottle to ankle/feet    Consulted and Agree with Plan of Care Patient           Patient will benefit from skilled therapeutic intervention in order to improve the following deficits and impairments:  Abnormal gait,Decreased range of motion,Difficulty  walking,Decreased endurance,Decreased activity tolerance,Pain,Impaired flexibility,Improper body mechanics,Decreased mobility,Decreased strength,Increased muscle spasms,Decreased balance  Visit Diagnosis: Difficulty in walking, not elsewhere classified  Other muscle spasm  Pain in right ankle and joints of right foot  Muscle weakness (generalized)  Stiffness of right ankle, not elsewhere classified  Pain in left foot  Stiffness of left foot, not elsewhere classified     Problem List Patient Active Problem List   Diagnosis Date Noted  . Weakness 04/08/2015  . Diabetes mellitus without complication ([place]) 46/80/3212  . Anxiety 04/07/2015  . Left-sided weakness 04/07/2015  . Hypertension   . Asthma   . DDD (degenerative disc disease), lumbar   . Obesity   . HLD (hyperlipidemia)   . Hypothyroidism   . Essential hypertension   . Atrial fibrillation ([place])   . Fibroids 03/01/2012  . ALLERGIC RHINITIS 07/05/2006  . LOW BACK PAIN, CHRONIC 07/05/2006  . BACK PAIN 07/05/2006  . DYSURIA 07/05/2006    Hall Busing, PT, DPT 07/21/2020, 9:29 AM  [place]. [place], [place], [address] Phone: [phone]   Fax:  [phone]  Name: Tonya Quinn MRN: [id number] Date of Birth: [birth year]

## 2020-07-23 ENCOUNTER — Ambulatory Visit: Payer: Medicare Other

## 2020-07-23 ENCOUNTER — Other Ambulatory Visit: Payer: Self-pay

## 2020-07-23 DIAGNOSIS — M62838 Other muscle spasm: Secondary | ICD-10-CM

## 2020-07-23 DIAGNOSIS — M79672 Pain in left foot: Secondary | ICD-10-CM

## 2020-07-23 DIAGNOSIS — M25552 Pain in left hip: Secondary | ICD-10-CM

## 2020-07-23 DIAGNOSIS — M25671 Stiffness of right ankle, not elsewhere classified: Secondary | ICD-10-CM

## 2020-07-23 DIAGNOSIS — M25662 Stiffness of left knee, not elsewhere classified: Secondary | ICD-10-CM

## 2020-07-23 DIAGNOSIS — M25571 Pain in right ankle and joints of right foot: Secondary | ICD-10-CM

## 2020-07-23 DIAGNOSIS — M25562 Pain in left knee: Secondary | ICD-10-CM

## 2020-07-23 DIAGNOSIS — M25675 Stiffness of left foot, not elsewhere classified: Secondary | ICD-10-CM

## 2020-07-23 DIAGNOSIS — M6281 Muscle weakness (generalized): Secondary | ICD-10-CM

## 2020-07-23 DIAGNOSIS — R262 Difficulty in walking, not elsewhere classified: Secondary | ICD-10-CM | POA: Diagnosis not present

## 2020-07-23 NOTE — Therapy (Signed)
[place]. [place], [place], [address] Phone: [phone]   Fax:  [phone]  Physical Therapy Treatment/ Discharge  Patient Details  Name: Tonya Quinn MRN: [id number] Date of Birth: 1966/02/21 Referring Provider (PT): Rip Harbour   Encounter Date: 07/23/2020   PT End of Session - 07/23/20 0927    Visit Number 18    Date for PT Re-Evaluation 07/31/20    Authorization Type UHC medicare    PT Start Time 0845    PT Stop Time 0926    PT Time Calculation (min) 41 min    Activity Tolerance Patient tolerated treatment well;Patient limited by pain    Behavior During Therapy [facility] for tasks assessed/performed           Past Medical History:  Diagnosis Date  . Asthma   . Atrial fibrillation ([place])   . DDD (degenerative disc disease), lumbar   . Diabetes mellitus   . HLD (hyperlipidemia)   . Hypertension   . Obesity   . Sleep apnea     Past Surgical History:  Procedure Laterality Date  . ABDOMINAL HYSTERECTOMY    . CYSTOSCOPY  02/29/2012   Procedure: CYSTOSCOPY;  Surgeon: Cheri Fowler, MD;  Location: [facility];  Service: Gynecology;  Laterality: N/A;  . LAPAROSCOPIC ASSISTED VAGINAL HYSTERECTOMY  02/29/2012   Procedure: LAPAROSCOPIC ASSISTED VAGINAL HYSTERECTOMY;  Surgeon: Cheri Fowler, MD;  Location: [facility];  Service: Gynecology;  Laterality: N/A;  . SALPINGOOPHORECTOMY  02/29/2012   Procedure: SALPINGO OOPHORECTOMY;  Surgeon: Cheri Fowler, MD;  Location: [facility];  Service: Gynecology;  Laterality: Bilateral;  . uterine ablation    . VEIN SURGERY      There were no vitals filed for this visit.   Subjective Assessment - 07/23/20 0918    Subjective doing okay. No pain today.    Pertinent History uncontrolled T2DM, HTN, history of Afib, DJD, Asthma treated by inhaler. MRI L Knee 04/21/20: No meniscal or ligamentous injury of the left knee. Partial-thickness cartilage loss of the medial trochlea and medial  femorotibial compartment.  Moderate joint effusion.  Small leaking Baker's cyst.    Currently in Pain? No/denies                             [facility] Adult PT Treatment/Exercise - 07/23/20 0001      Exercises   Exercises Knee/Hip;Ankle      Knee/Hip Exercises: Stretches   Gastroc Stretch Strap 30 sec x 3     Knee/Hip Exercises: Seated   Sit to [place] 5 reps   from airex , with standing balance each rep              Ankle Exercises: Standing   Toe Raise --   seated 2 x 20 toe plantarflexion against yellow TB, heel on ground     Ankle Exercises: Seated   Ankle Circles/Pumps Both;Strengthening   3 x 10 with 2.5# AWs   Towel Crunch --   2x20 reps each side - very difficult to complete but able to grip towel well   [place] Pickup tissue ball pickups  3 reps x 6 sets either foot                    PT Short Term Goals - 07/23/20 0940      PT SHORT TERM GOAL #1   Title independent with updated HEP    Time 2  Period Weeks    Status Partially Met   reports only doing some, forgets   Target Date 07/15/20             PT Long Term Goals - 07/23/20 0918      PT LONG TERM GOAL #1   Title Pt will have improved L Knee flexion ROM to at least 125 deg and </= 3/10 pain for improved functional mobility with gait and transfers    Time 4    Period Weeks    Status Deferred   0-105 with 5/10 kne epain     PT LONG TERM GOAL #2   Title decrease pain to </= 3/10 with ADLs    Time 4    Period Weeks    Status Partially Met   ranging 3-8/10 when on feet for a long time, right ankle pressure/pain and left knee instability. Occasionally no pain     PT LONG TERM GOAL #3   Title Pt will be able to walk without limping and with improved symmetry of steps and stance, </= 3/10 pain.    Time 4    Period Weeks    Status Partially Met   continued asymmetry of gait, antalgic     PT LONG TERM GOAL #4   Title BLE strength to at least 4/5    Time 4    Period Weeks     Status Partially Met   making gradual progress. Slight improvement in left knee/ankle and R ankle strength since initial eval. Limited by pain     PT LONG TERM GOAL #5   Title B ankle ROM to at least 10 deg DF to 50 deg PF without pain    Time 4    Period Weeks    Status Achieved   10 -50 deg B                Plan - 07/23/20 0928    Clinical Impression Statement Pt tolerated todays session fairly. She requested that today be last PT session. She continues to get pain in the right ankle, L knee and left heel which occasionally is not there but still is bothersome. Aside from this she reports ms spasms all around her body have also been very limiting tolerance to activity and exercises. She was educated in follow up with MD regarding this. Updated HEP was provided and [place] is discharged from skilled PT at this time.    Personal Factors and Comorbidities Comorbidity 3+;Past/Current Experience    Comorbidities uncontrolled T2DM, HTN, history of Afib, DJD, Asthma treated by inhaler.    PT Treatment/Interventions ADLs/Self Care Home Management;Cryotherapy;Electrical Stimulation;Iontophoresis 67m/ml Dexamethasone;Moist Heat;Gait training;Neuromuscular re-education;Balance training;Therapeutic exercise;Therapeutic activities;Functional mobility training;Stair training;Patient/family education;Manual techniques;Taping    PT Next Visit Plan manual and modalities as needed. progress TE for LE strengthening, ankle/foot mobility. Pain and instability  with weightbearing so progress any standing exercises based on tolerance. If good standing tolerance do more standing strength and balance.    PT Home Exercise Plan Educated on increasing freq of stretches as tolerated.  Continue ice pack/frozen waterbottle to ankle/feet    Consulted and Agree with Plan of Care Patient           Patient will benefit from skilled therapeutic intervention in order to improve the following deficits and impairments:   Abnormal gait,Decreased range of motion,Difficulty walking,Decreased endurance,Decreased activity tolerance,Pain,Impaired flexibility,Improper body mechanics,Decreased mobility,Decreased strength,Increased muscle spasms,Decreased balance  Visit Diagnosis: Difficulty in walking, not elsewhere classified  Other muscle spasm  Pain in right ankle and joints of right foot  Muscle weakness (generalized)  Stiffness of right ankle, not elsewhere classified  Pain in left foot  Stiffness of left foot, not elsewhere classified  Stiffness of left knee, not elsewhere classified  Acute pain of left knee  Pain in left hip     Problem List Patient Active Problem List   Diagnosis Date Noted  . Weakness 04/08/2015  . Diabetes mellitus without complication ([place]) 09/62/8366  . Anxiety 04/07/2015  . Left-sided weakness 04/07/2015  . Hypertension   . Asthma   . DDD (degenerative disc disease), lumbar   . Obesity   . HLD (hyperlipidemia)   . Hypothyroidism   . Essential hypertension   . Atrial fibrillation ([place])   . Fibroids 03/01/2012  . ALLERGIC RHINITIS 07/05/2006  . LOW BACK PAIN, CHRONIC 07/05/2006  . BACK PAIN 07/05/2006  . DYSURIA 07/05/2006   PHYSICAL THERAPY DISCHARGE SUMMARY   Plan: Patient agrees to discharge.  Patient goals were partially met. Patient is being discharged due to the patient's request.  ?????       Hall Busing, PT, DPT 07/23/2020, 10:20 AM  [place]. [place], [place], [address] Phone: [phone]   Fax:  [phone]  Name: Vinette Crites MRN: [id number] Date of Birth: [birth year]

## 2021-02-09 IMAGING — MR MR KNEE*L* W/O CM
7 series · 38 of 40 positions shown · non-contrast
Comparison: None.

CLINICAL DATA: Left knee pain and instability

EXAM:
MRI OF THE LEFT KNEE WITHOUT CONTRAST
TECHNIQUE: Multiplanar, multisequence MR imaging of the knee was performed. No
intravenous contrast was administered.

[Series 3: T2 fat-sat · axial · left · 4.0mm · 0.50mm/px · z∈[-124,+12]mm · 6 of 32 slices shown (1 of 3)]
[im 1/32]
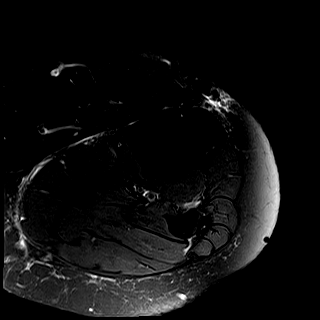
[im 7/32]
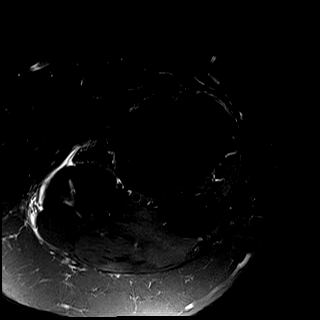
[im 13/32]
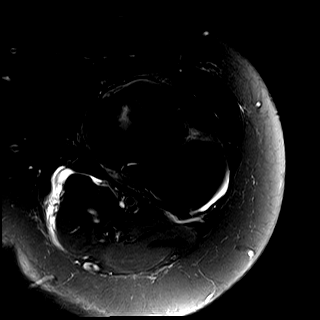
[im 19/32]
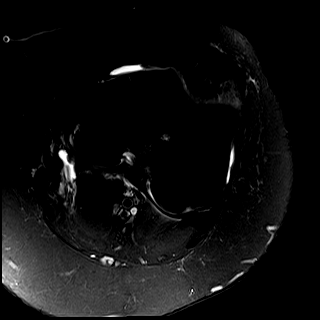
[im 25/32]
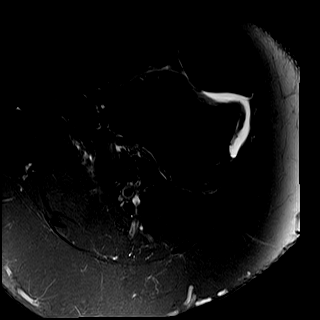
[im 32/32]
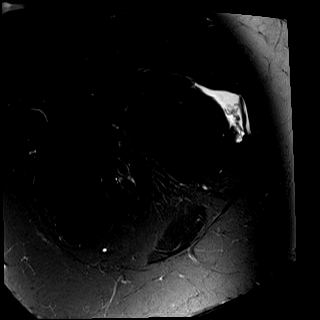

[Series 4: T2 fat-sat · coronal · left · 4.0mm · 0.50mm/px · 6 of 30 slices shown (2 of 3)]
[im 1/30]
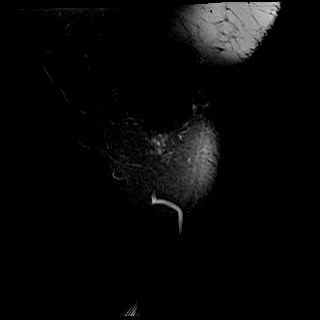
[im 6/30]
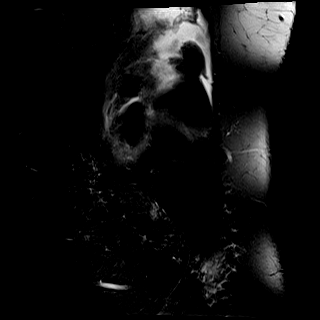
[im 12/30]
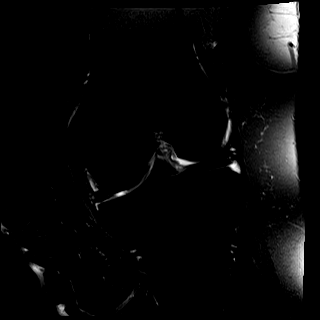
[im 18/30]
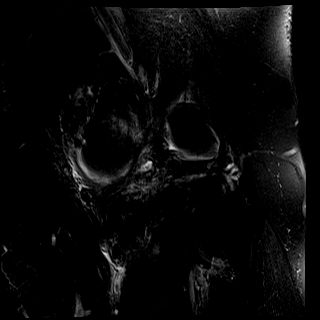
[im 24/30]
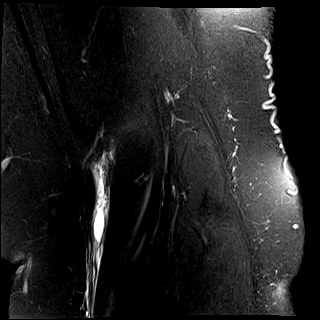
[im 30/30]
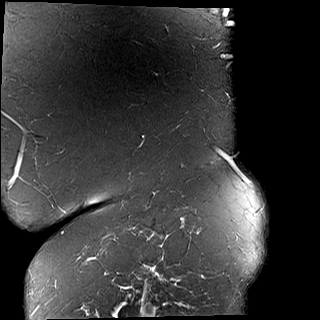

[Series 5: T1 · coronal · left · 4.0mm · 0.50mm/px · 6 of 28 slices shown]
[im 1/28]
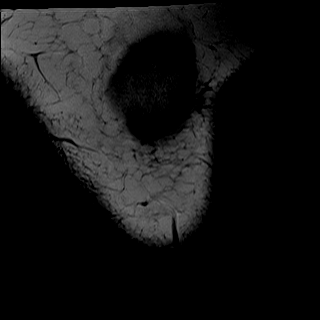
[im 6/28]
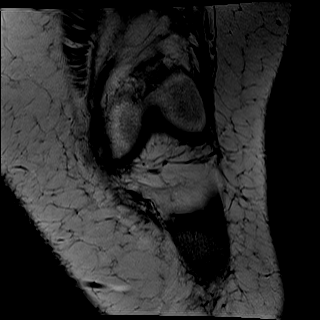
[im 11/28]
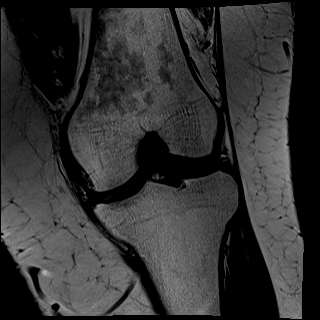
[im 17/28]
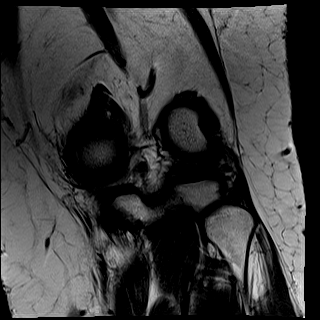
[im 22/28]
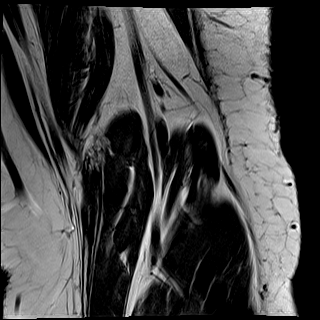
[im 28/28]
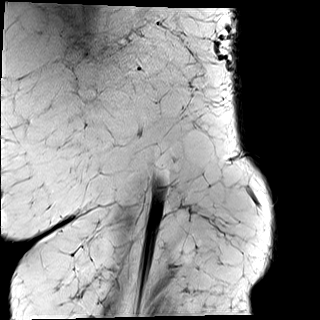

[Series 6: PD fat-sat · coronal · left · 3.3mm · 0.62mm/px · 6 of 30 slices shown (1 of 2)]
[im 1/30]
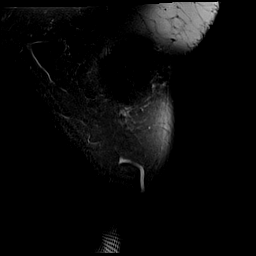
[im 6/30]
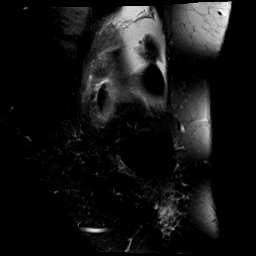
[im 12/30]
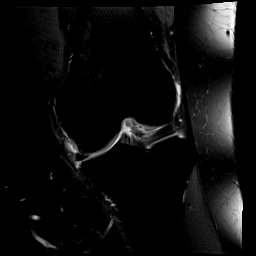
[im 18/30]
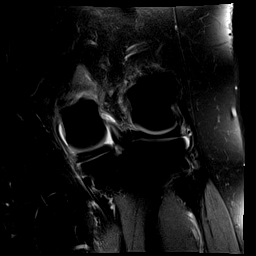
[im 24/30]
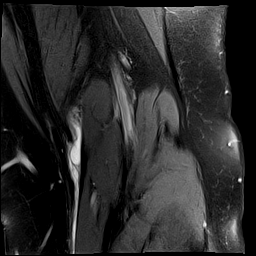
[im 30/30]
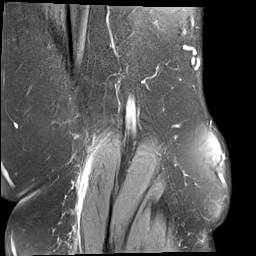

[Series 7: PD fat-sat · sagittal · left · 3.3mm · 0.52mm/px · 5 of 25 slices shown (2 of 2)]
[im 1/25]
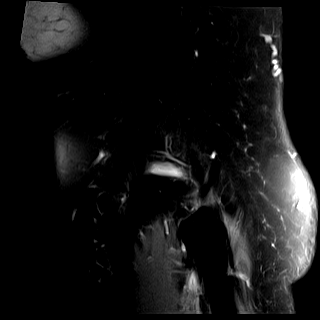
[im 7/25]
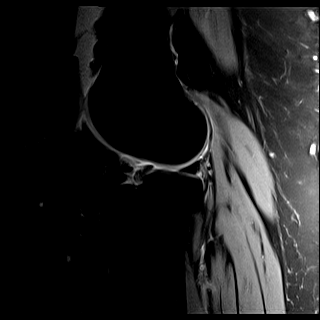
[im 13/25]
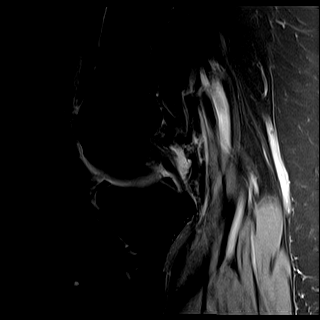
[im 19/25]
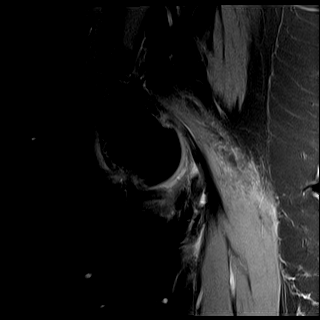
[im 25/25]
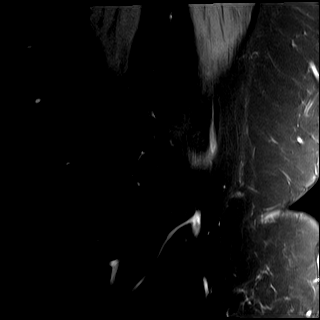

[Series 8: T2 fat-sat · sagittal · left · 3.3mm · 0.53mm/px · 5 of 27 slices shown (3 of 3)]
[im 1/27]
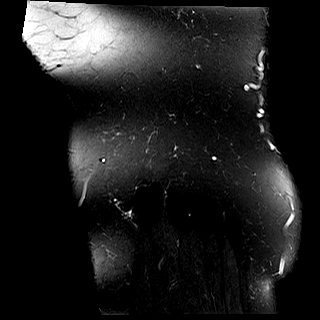
[im 7/27]
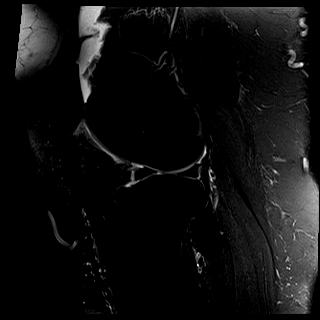
[im 14/27]
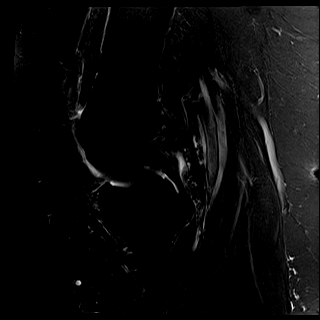
[im 20/27]
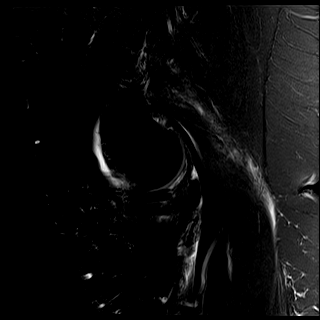
[im 27/27]
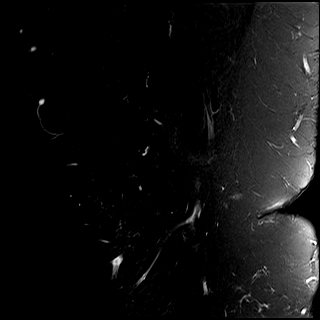

[Series 9: STIR · coronal · left · 4.0mm · 0.25mm/px · 4 of 29 slices shown]
[im 1/29]
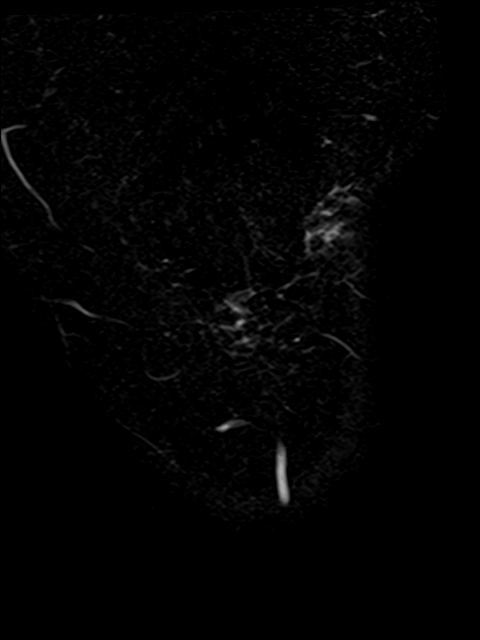
[im 6/29]
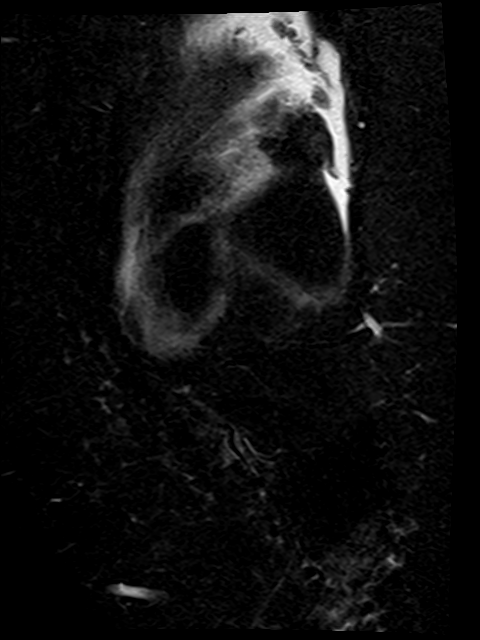
[im 12/29]
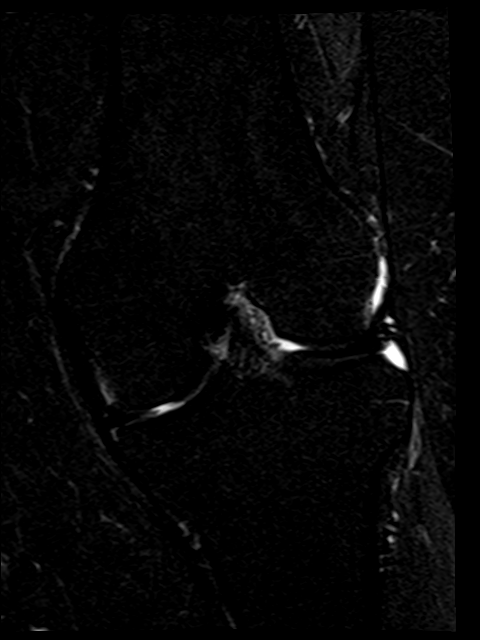
[im 17/29]
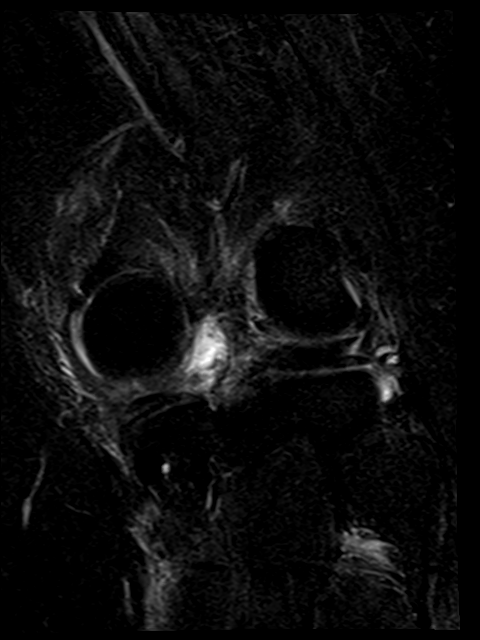

[38 of 40 positions shown; findings below may reference images not displayed]

FINDINGS: MENISCI

Medial: Degeneration of the medial meniscus.  No discrete tear.

Lateral: Degeneration of the lateral meniscus.  No discrete tear.

LIGAMENTS

Cruciates: ACL and PCL are intact.

Collaterals: Medial collateral ligament is intact. Lateral
collateral ligament complex is intact.

CARTILAGE

Patellofemoral: Partial-thickness cartilage loss of the medial
trochlea.

Medial: Partial-thickness cartilage loss of the medial femorotibial
compartment.

Lateral:  No chondral defect.

JOINT: Moderate joint effusion. Normal [place] Raid. No plical
thickening.

POPLITEAL FOSSA: Popliteus tendon is intact. Small leaking Baker's
cyst.

EXTENSOR MECHANISM: Intact quadriceps tendon. Intact patellar
tendon. Intact lateral patellar retinaculum. Intact medial patellar
retinaculum. Intact MPFL.

BONES: No aggressive osseous lesion. No fracture or dislocation.

Other: No fluid collection or hematoma. Muscles are normal.
IMPRESSION: 1. No meniscal or ligamentous injury of the left knee.
2. Partial-thickness cartilage loss of the medial trochlea and
medial femorotibial compartment.
3. Moderate joint effusion.
4. Small leaking Baker's cyst.

## 2021-03-30 ENCOUNTER — Other Ambulatory Visit: Payer: Self-pay

## 2021-03-30 ENCOUNTER — Emergency Department (HOSPITAL_BASED_OUTPATIENT_CLINIC_OR_DEPARTMENT_OTHER): Payer: Medicare Other

## 2021-03-30 ENCOUNTER — Emergency Department (HOSPITAL_BASED_OUTPATIENT_CLINIC_OR_DEPARTMENT_OTHER)
Admission: EM | Admit: 2021-03-30 | Discharge: 2021-03-30 | Disposition: A | Discharge status: Home / Self Care | Payer: Medicare Other | Attending: Emergency Medicine | Admitting: Emergency Medicine

## 2021-03-30 ENCOUNTER — Encounter (HOSPITAL_BASED_OUTPATIENT_CLINIC_OR_DEPARTMENT_OTHER): Payer: Self-pay

## 2021-03-30 DIAGNOSIS — Z794 Long term (current) use of insulin: Secondary | ICD-10-CM | POA: Insufficient documentation

## 2021-03-30 DIAGNOSIS — Z79899 Other long term (current) drug therapy: Secondary | ICD-10-CM | POA: Insufficient documentation

## 2021-03-30 DIAGNOSIS — J209 Acute bronchitis, unspecified: Secondary | ICD-10-CM | POA: Insufficient documentation

## 2021-03-30 DIAGNOSIS — R059 Cough, unspecified: Secondary | ICD-10-CM | POA: Diagnosis present

## 2021-03-30 DIAGNOSIS — H66001 Acute suppurative otitis media without spontaneous rupture of ear drum, right ear: Secondary | ICD-10-CM | POA: Diagnosis not present

## 2021-03-30 DIAGNOSIS — J45909 Unspecified asthma, uncomplicated: Secondary | ICD-10-CM | POA: Diagnosis not present

## 2021-03-30 DIAGNOSIS — Z7951 Long term (current) use of inhaled steroids: Secondary | ICD-10-CM | POA: Diagnosis not present

## 2021-03-30 DIAGNOSIS — E119 Type 2 diabetes mellitus without complications: Secondary | ICD-10-CM | POA: Diagnosis not present

## 2021-03-30 DIAGNOSIS — Z7984 Long term (current) use of oral hypoglycemic drugs: Secondary | ICD-10-CM | POA: Insufficient documentation

## 2021-03-30 DIAGNOSIS — I10 Essential (primary) hypertension: Secondary | ICD-10-CM | POA: Insufficient documentation

## 2021-03-30 LAB — CBC WITH DIFFERENTIAL/PLATELET
Abs Immature Granulocytes: 0.07 10*3/uL (ref 0.00–0.07)
Basophils Absolute: 0 10*3/uL (ref 0.0–0.1)
Basophils Relative: 0 %
Eosinophils Absolute: 0.1 10*3/uL (ref 0.0–0.5)
Eosinophils Relative: 1 %
HCT: 39.5 % (ref 36.0–46.0)
Hemoglobin: 12.7 g/dL (ref 12.0–15.0)
Immature Granulocytes: 1 %
Lymphocytes Relative: 36 %
Lymphs Abs: 2.8 10*3/uL (ref 0.7–4.0)
MCH: 27.2 pg (ref 26.0–34.0)
MCHC: 32.2 g/dL (ref 30.0–36.0)
MCV: 84.6 fL (ref 80.0–100.0)
Monocytes Absolute: 0.4 10*3/uL (ref 0.1–1.0)
Monocytes Relative: 5 %
Neutro Abs: 4.5 10*3/uL (ref 1.7–7.7)
Neutrophils Relative %: 57 %
Platelets: 187 10*3/uL (ref 150–400)
RBC: 4.67 MIL/uL (ref 3.87–5.11)
RDW: 14.9 % (ref 11.5–15.5)
WBC: 7.9 10*3/uL (ref 4.0–10.5)
nRBC: 0 % (ref 0.0–0.2)

## 2021-03-30 LAB — BASIC METABOLIC PANEL
Anion gap: 7 (ref 5–15)
BUN: 23 mg/dL — ABNORMAL HIGH (ref 6–20)
CO2: 26 mmol/L (ref 22–32)
Calcium: 8.6 mg/dL — ABNORMAL LOW (ref 8.9–10.3)
Chloride: 104 mmol/L (ref 98–111)
Creatinine, Ser: 0.87 mg/dL (ref 0.44–1.00)
GFR, Estimated: >60 mL/min (ref >60)
Glucose, Bld: 164 mg/dL — ABNORMAL HIGH (ref 70–99)
Potassium: 3.8 mmol/L (ref 3.5–5.1)
Sodium: 137 mmol/L (ref 135–145)

## 2021-03-30 MED ORDER — ACETAMINOPHEN-CODEINE 120-12 MG/5ML PO SOLN: 5.0000 mL | Freq: Four times a day (QID) | ORAL | 0 refills | Status: DC | PRN

## 2021-03-30 MED ORDER — IPRATROPIUM-ALBUTEROL 0.5-2.5 (3) MG/3ML IN SOLN
3.0000 mL | Freq: Once | RESPIRATORY_TRACT | Status: AC
  Administered 2021-03-30: 3 mL via RESPIRATORY_TRACT
  Filled 2021-03-30: qty 3

## 2021-03-30 MED ORDER — AMOXICILLIN 500 MG PO CAPS: 1000.0000 mg | ORAL_CAPSULE | Freq: Three times a day (TID) | ORAL | 0 refills | Status: AC

## 2021-03-30 NOTE — Discharge Instructions (Addendum)
Your work-up today was very reassuring.  Fortunately, your x-ray was negative for pneumonia and infection.  Similarly your lab work was also very reassuring.  It seems that you had some symptomatic improvement with the DuoNeb treatment that we did today.  Continue using your albuterol inhaler and nebulizer at home to help with your symptoms.  I have also sent you in a different cough medication that may work better for you than Tessalon.  I would use this primarily at night as it can make you very drowsy.  Please do not take more than the recommended dose.  On exam, I also note that you have an infection of your right ear.  I have sent you in a medication that should help resolve this.  Please follow-up with your primary care doctor next week if symptoms have not improved or resolved.  I hope you feel better soon.  Please return to the ED if you develop worsening shortness of breath, difficulty breathing or fevers that cannot be resolved with Tylenol or Motrin.

## 2021-03-30 NOTE — ED Triage Notes (Addendum)
Pt c/o flu like sx x 2 weeks-seen at [facility] ED for same 4 days ago-states she was also seen at PCP last week-reports neg covid x 2-NAD- to triage in w/c

## 2021-03-30 NOTE — ED Provider Notes (Signed)
[facility] EMERGENCY DEPARTMENT Provider Note   CSN: [id number] Arrival date & time: 03/30/21  1648     History  Chief Complaint  Patient presents with   Cough    Tonya Quinn is a 56 y.o. female with history of asthma, hypertension, diabetes who presents to the ED for evaluation of flulike symptoms x2 weeks.  Patient was seen at [facility] regional ED on 03/26/2020 for similar symptoms.  COVID and flu are negative.  Chest x-ray was negative.  She was given a dose of steroids and sent home with a short course as well.  She was also given a short course of antibiotics and Tessalon Perles for cough.  She states that she started feeling better 2 days ago, however yesterday symptoms suddenly worsened again.  She is having aggressive dry cough impacting her ability to sleep.  She also endorses right ear pain as well.  Continues to feel nauseous despite Zofran prescribed at [facility].  She is here requesting "observation" and feels that something is wrong with her.   Cough Associated symptoms: no fever, no headaches, no rash and no shortness of breath       Home Medications Prior to Admission medications   Medication Sig Start Date End Date Taking? Authorizing Provider  acetaminophen (TYLENOL) 500 MG tablet Take 1 tablet (500 mg total) by mouth every 6 (six) hours as needed. 04/06/20   Laban Emperor, PA-C  albuterol (PROVENTIL HFA;VENTOLIN HFA) 108 (90 Base) MCG/ACT inhaler Inhale into the lungs.    [provider]  ALPRAZolam Duanne Moron) 0.25 MG tablet Take 0.25 mg by mouth at bedtime as needed. For anxiety    [provider]  amLODipine (NORVASC) 10 MG tablet Take 10 mg by mouth daily.    [provider]  atenolol (TENORMIN) 50 MG tablet Take 25 mg by mouth daily.    [provider]  azithromycin (ZITHROMAX Z-PAK) 250 MG tablet 2 po day one, then 1 daily x 4 days 06/02/16   Charlesetta Shanks, MD  Baclofen 5 MG TABS Take 5  mg by mouth 3 (three) times daily as needed. 04/06/20   Laban Emperor, PA-C  benzonatate (TESSALON) 100 MG capsule Take 1 capsule (100 mg total) by mouth every 8 (eight) hours. 06/02/16   Charlesetta Shanks, MD  cephALEXin (KEFLEX) 500 MG capsule Take 1 capsule (500 mg total) by mouth 4 (four) times daily. 08/06/17   Law, Bea Graff, PA-C  Cetirizine HCl (ZYRTEC ALLERGY) 10 MG CAPS Take 1 capsule (10 mg total) by mouth daily. 05/14/16   Blanchie Dessert, MD  Cholecalciferol (VITAMIN D PO) Take 1 tablet by mouth daily.    [provider]  diclofenac sodium (VOLTAREN) 1 % GEL Apply 4 g topically 4 (four) times daily. 07/15/15   Palumbo, April, MD  FLUoxetine HCl (PROZAC PO) Take by mouth.    [provider]  glipiZIDE (GLUCOTROL) 5 MG tablet Take 5 mg by mouth daily before breakfast.    [provider]  HYDROcodone-homatropine (HYCODAN) 5-1.5 MG/5ML syrup Take 5 mLs by mouth every 6 (six) hours as needed for cough. 06/02/16   Charlesetta Shanks, MD  Insulin Lispro (HUMALOG [place]) Inject into the skin.    [provider]  loratadine (CLARITIN) 10 MG tablet Take 10 mg by mouth daily as needed for allergies.    [provider]  losartan (COZAAR) 100 MG tablet Take 100 mg by mouth daily.    [provider]  metFORMIN (GLUCOPHAGE) 1000 MG tablet Take 1,000 mg by mouth daily with breakfast.    [provider]  methocarbamol (ROBAXIN) 500 MG tablet Take 1 tablet (500 mg total) by mouth 2 (two) times daily. 07/03/20   Suzy Bouchard, PA-C  omeprazole (PRILOSEC) 20 MG capsule Take 1 capsule (20 mg total) by mouth daily. 02/01/16   Charlesetta Shanks, MD  predniSONE (DELTASONE) 20 MG tablet 3 tabs po daily x 3 days, then 2 tabs x 3 days, then 1.5 tabs x 3 days, then 1 tab x 3 days, then 0.5 tabs x 3 days 06/10/16   Orpah Greek, MD  ranitidine (ZANTAC) 150 MG capsule Take 1 capsule (150 mg total) by mouth daily. 07/18/17   Fatima Blank, MD   thalidomide (THALOMID) 50 MG capsule Take 25 mg by mouth at bedtime. Take with water.    [provider]  tiZANidine (ZANAFLEX) 4 MG capsule Take 4 mg by mouth 3 (three) times daily.    [provider]      Allergies    Fluoxetine, Lisinopril, Sertraline, and Insulin glargine    Review of Systems   Review of Systems  Constitutional:  Negative for fever.  HENT: Negative.    Eyes: Negative.   Respiratory:  Positive for cough. Negative for shortness of breath.   Cardiovascular: Negative.   Gastrointestinal:  Negative for abdominal pain and vomiting.  Endocrine: Negative.   Genitourinary: Negative.   Musculoskeletal: Negative.   Skin:  Negative for rash.  Neurological:  Negative for headaches.  All other systems reviewed and are negative.  Physical Exam Updated Vital Signs BP (!) 145/83 (BP Location: Right Arm)    Pulse 100    Temp 98.2 F (36.8 C) (Oral)    Resp 18    LMP 02/16/2012    SpO2 100%  Physical Exam Vitals and nursing note reviewed.  Constitutional:      General: She is not in acute distress.    Appearance: She is not ill-appearing.  HENT:     Head: Atraumatic.     Left Ear: Tympanic membrane and ear canal normal.     Ears:     Comments: Right TM erythematous, slight bulging Eyes:     Conjunctiva/sclera: Conjunctivae normal.  Cardiovascular:     Rate and Rhythm: Normal rate and regular rhythm.     Pulses: Normal pulses.     Heart sounds: No murmur heard. Pulmonary:     Effort: Pulmonary effort is normal. No respiratory distress.     Breath sounds: Normal breath sounds.  Abdominal:     General: Abdomen is flat. There is no distension.     Palpations: Abdomen is soft.     Tenderness: There is no abdominal tenderness.  Musculoskeletal:        General: Normal range of motion.     Cervical back: Normal range of motion.  Skin:    General: Skin is warm and dry.     Capillary Refill: Capillary refill takes less than 2 seconds.  Neurological:      General: No focal deficit present.     Mental Status: She is alert.  Psychiatric:        Mood and Affect: Mood normal.    ED Results / Procedures / Treatments   Labs (all labs ordered are listed, but only abnormal results are displayed) Labs Reviewed  BASIC METABOLIC PANEL  CBC WITH DIFFERENTIAL/PLATELET    EKG None  Radiology No results found.  Procedures  Procedures    Medications Ordered in ED Medications  ipratropium-albuterol (DUONEB) 0.5-2.5 (3) MG/3ML nebulizer solution 3 mL (has no administration in time range)    ED Course/ Medical Decision Making/ A&P Clinical Course as of 03/30/21 2322  Tue Mar 30, 2021  0240 Basic metabolic panel(!) BMP normal [EC]  1820 CBC with Differential CBC normal [EC]  1820 DG Chest 2 View Chest x-ray normal [EC]    Clinical Course User Index [EC] Tonye Pearson, PA-C                           Medical Decision Making Amount and/or Complexity of Data Reviewed Labs: ordered. Radiology: ordered.  Risk Prescription drug management.   History:  Tonya Quinn is a 56 y.o. female with history of asthma, hypertension, diabetes who presents to the ED for evaluation of flulike symptoms x2 weeks.  Patient was seen at [facility] regional ED on 03/26/2020 for similar symptoms.  COVID and flu are negative.  Chest x-ray was negative.  She was given a dose of steroids and sent home with a short course as well.  She was also given a short course of antibiotics and Tessalon Perles for cough.  She states that she started feeling better 2 days ago, however yesterday symptoms suddenly worsened again.  She is having aggressive dry cough impacting her ability to sleep.  Continues to feel nauseous despite Zofran prescribed at [facility].  She is here requesting "observation" and feels that something is wrong with her. External records from outside source obtained and reviewed including ED summary from [facility] on  03/26/2021, recent lab results and imaging. Details: Chest x-ray negative, respiratory panel negative, patient was given dose of steroids and antibiotics. This patient presents to the ED for concern of cough, this involves an extensive number of treatment options, and is a complaint that carries with it a high risk of complications and morbidity.  Differential diagnosis for emergent cause of cough includes but is not limited to upper respiratory infection, lower respiratory infection, allergies, asthma, irritants, foreign body, medications such as ACE inhibitors, reflux, asthma, CHF, lung cancer, interstitial lung disease, psychiatric causes, postnasal drip and postinfectious bronchospasm.   Initial impression:  Patient is sitting up in chair coughing extensively when I entered the room.  Cough sounded dry, hacking.  Physical exam was otherwise normal.  Lab Tests and EKG:  I Ordered, reviewed, and interpreted labs and EKG.  The pertinent results in my decision-making regarding them are detailed in the ED course and/or initial impression section above.   Imaging Studies ordered:  I ordered imaging studies including chest x-ray I independently visualized and interpreted imaging and I agree with the radiologist interpretation. Decisions made regarding results are detailed in the ED course and/or initial impression section above.   Medicines ordered and prescription drug management:  I ordered medication including: DuoNeb for cough Reevaluation of the patient after these medicines showed that the patient improved I have reviewed the patients home medicines and have made adjustments as needed    Disposition:  After consideration of the diagnostic results, physical exam, history and the patients response to treatment feel that the patent would benefit from discharge with outpatient treatment and follow-up.   Acute bronchitis Otitis media right ear without rupture:  Patient's work-up was  very reassuring as well as her physical exam.  She does have quite an aggressive, dry hacking cough that I suspect  is from postviral bronchitis.  Additionally she has an ear infection of the right ear causing her pain.  I sent her in a prescription for amoxicillin to treat her ear infection.  Additionally, since her at home inhaler and Ladona Ridgel are no longer working, I have given her some codeine for her cough which she states is worked better for her in the past.  I advised her to follow-up with her primary care doctor in 1 week to ensure resolution of the ear infection.  Also advised that sometimes cough can linger for a few weeks following an upper respiratory infection and that this is normal.  Discussed return precautions.  All questions asked and answered.  Patient discharged home in good condition.   Final Clinical Impression(s) / ED Diagnoses Final diagnoses:  Acute bronchitis, unspecified organism  Non-recurrent acute suppurative otitis media of right ear without spontaneous rupture of tympanic membrane    Rx / DC Orders ED Discharge Orders          Ordered    acetaminophen-codeine 120-12 MG/5ML solution  Every 6 hours PRN        03/30/21 1918    amoxicillin (AMOXIL) 500 MG capsule  3 times daily        03/30/21 1918              Rodena Piety 03/30/21 2325    Davonna Belling, MD 03/30/21 2349

## 2021-05-24 ENCOUNTER — Emergency Department (HOSPITAL_COMMUNITY): Payer: Medicare Other

## 2021-05-24 ENCOUNTER — Emergency Department (HOSPITAL_COMMUNITY)
Admission: EM | Admit: 2021-05-24 | Discharge: 2021-05-24 | Disposition: A | Discharge status: Home / Self Care | Payer: Medicare Other | Attending: Emergency Medicine | Admitting: Emergency Medicine

## 2021-05-24 ENCOUNTER — Other Ambulatory Visit: Payer: Self-pay

## 2021-05-24 ENCOUNTER — Emergency Department (HOSPITAL_BASED_OUTPATIENT_CLINIC_OR_DEPARTMENT_OTHER): Payer: Medicare Other

## 2021-05-24 ENCOUNTER — Encounter (HOSPITAL_COMMUNITY): Payer: Self-pay | Admitting: Emergency Medicine

## 2021-05-24 DIAGNOSIS — M7989 Other specified soft tissue disorders: Secondary | ICD-10-CM | POA: Insufficient documentation

## 2021-05-24 DIAGNOSIS — Z794 Long term (current) use of insulin: Secondary | ICD-10-CM | POA: Diagnosis not present

## 2021-05-24 DIAGNOSIS — J40 Bronchitis, not specified as acute or chronic: Secondary | ICD-10-CM | POA: Diagnosis not present

## 2021-05-24 DIAGNOSIS — Z79899 Other long term (current) drug therapy: Secondary | ICD-10-CM | POA: Insufficient documentation

## 2021-05-24 DIAGNOSIS — M79604 Pain in right leg: Secondary | ICD-10-CM

## 2021-05-24 DIAGNOSIS — I10 Essential (primary) hypertension: Secondary | ICD-10-CM | POA: Diagnosis not present

## 2021-05-24 DIAGNOSIS — R600 Localized edema: Secondary | ICD-10-CM | POA: Diagnosis not present

## 2021-05-24 DIAGNOSIS — R040 Epistaxis: Secondary | ICD-10-CM | POA: Diagnosis not present

## 2021-05-24 DIAGNOSIS — E1165 Type 2 diabetes mellitus with hyperglycemia: Secondary | ICD-10-CM | POA: Diagnosis not present

## 2021-05-24 DIAGNOSIS — R042 Hemoptysis: Secondary | ICD-10-CM

## 2021-05-24 DIAGNOSIS — R791 Abnormal coagulation profile: Secondary | ICD-10-CM | POA: Insufficient documentation

## 2021-05-24 DIAGNOSIS — M79605 Pain in left leg: Secondary | ICD-10-CM | POA: Diagnosis not present

## 2021-05-24 DIAGNOSIS — Z20822 Contact with and (suspected) exposure to covid-19: Secondary | ICD-10-CM | POA: Insufficient documentation

## 2021-05-24 DIAGNOSIS — Z7984 Long term (current) use of oral hypoglycemic drugs: Secondary | ICD-10-CM | POA: Insufficient documentation

## 2021-05-24 LAB — COMPREHENSIVE METABOLIC PANEL
ALT: 17 U/L (ref 0–44)
AST: 16 U/L (ref 15–41)
Albumin: 3.7 g/dL (ref 3.5–5.0)
Alkaline Phosphatase: 101 U/L (ref 38–126)
Anion gap: 9 (ref 5–15)
BUN: 14 mg/dL (ref 6–20)
CO2: 26 mmol/L (ref 22–32)
Calcium: 9.2 mg/dL (ref 8.9–10.3)
Chloride: 105 mmol/L (ref 98–111)
Creatinine, Ser: 0.78 mg/dL (ref 0.44–1.00)
GFR, Estimated: >60 mL/min (ref >60)
Glucose, Bld: 194 mg/dL — ABNORMAL HIGH (ref 70–99)
Potassium: 4 mmol/L (ref 3.5–5.1)
Sodium: 140 mmol/L (ref 135–145)
Total Bilirubin: 0.5 mg/dL (ref 0.3–1.2)
Total Protein: 7.4 g/dL (ref 6.5–8.1)

## 2021-05-24 LAB — RESP PANEL BY RT-PCR (FLU A&B, COVID) ARPGX2
Influenza A by PCR: NEGATIVE
Influenza B by PCR: NEGATIVE
SARS Coronavirus 2 by RT PCR: NEGATIVE

## 2021-05-24 LAB — URINALYSIS, ROUTINE W REFLEX MICROSCOPIC
Bilirubin Urine: NEGATIVE
Glucose, UA: >=500 mg/dL — AB
Hgb urine dipstick: NEGATIVE
Ketones, ur: NEGATIVE mg/dL
Leukocytes,Ua: NEGATIVE
Nitrite: NEGATIVE
Protein, ur: NEGATIVE mg/dL
Specific Gravity, Urine: 1.02 (ref 1.005–1.030)
pH: 5 (ref 5.0–8.0)

## 2021-05-24 LAB — CBC
HCT: 42.8 % (ref 36.0–46.0)
Hemoglobin: 13.2 g/dL (ref 12.0–15.0)
MCH: 26.9 pg (ref 26.0–34.0)
MCHC: 30.8 g/dL (ref 30.0–36.0)
MCV: 87.2 fL (ref 80.0–100.0)
Platelets: 176 10*3/uL (ref 150–400)
RBC: 4.91 MIL/uL (ref 3.87–5.11)
RDW: 14.8 % (ref 11.5–15.5)
WBC: 4.4 10*3/uL (ref 4.0–10.5)
nRBC: 0 % (ref 0.0–0.2)

## 2021-05-24 LAB — LIPASE, BLOOD: Lipase: 30 U/L (ref 11–51)

## 2021-05-24 LAB — D-DIMER, QUANTITATIVE: D-Dimer, Quant: 0.6 ug/mL-FEU — ABNORMAL HIGH (ref 0.00–0.50)

## 2021-05-24 MED ORDER — SALINE SPRAY 0.65 % NA SOLN: 1.0000 | NASAL | 0 refills | Status: DC | PRN

## 2021-05-24 MED ORDER — IOHEXOL 350 MG/ML SOLN
100.0000 mL | Freq: Once | INTRAVENOUS | Status: AC | PRN
  Administered 2021-05-24: 100 mL via INTRAVENOUS

## 2021-05-24 MED ORDER — OXYMETAZOLINE HCL 0.05 % NA SOLN: 1.0000 | Freq: Two times a day (BID) | NASAL | 0 refills | Status: AC

## 2021-05-24 MED ORDER — OXYMETAZOLINE HCL 0.05 % NA SOLN
1.0000 | Freq: Once | NASAL | Status: AC
  Administered 2021-05-24: 1 via NASAL
  Filled 2021-05-24: qty 30

## 2021-05-24 NOTE — ED Provider Triage Note (Signed)
Emergency Medicine Provider Triage Evaluation Note ? ?Tonya Quinn [place] , a 56 y.o. female  was evaluated in triage.  Pt complains of chest congestion in the mornings.  She reports that for the past week and a half she experiences a cough productive of sputum with some red tinge.  She also feels sore all through her back, flanks and abdomen when she wakes up.  Also complains of left neck and shoulder tenderness. ? ?Endorses seasonal allergies that usually cause similar congested symptoms however says that she does not take her medication because she does not think it works.  Also says she started new medications recently. New medications include olmesartan. ? ?Denies any recent travel, surgery, history of DVT, smoking, estrogen products.  Has not noted any unilateral leg swelling. ? ? ?Review of Systems  ?See above ? ?Physical Exam  ?BP (!) 141/85 (BP Location: Left Arm)   Pulse 84   Temp 98.2 ?F (36.8 ?C) (Oral)   Resp 15   LMP 02/16/2012   SpO2 97%  ?Gen:   Awake, no distress   ?Resp:  Normal effort  ?MSK:   Moves extremities without difficulty  ?Other:  Lung sounds clear, complains of pain all over with a deep breath. ? ?Medical Decision Making  ?Medically screening exam initiated at 9:20 AM.  Appropriate orders placed.  Tonya Quinn was informed that the remainder of the evaluation will be completed by another provider, this initial triage assessment does not replace that evaluation, and the importance of remaining in the ED until their evaluation is complete. ? ? ?  ?Rhae Hammock, PA-C ?05/24/21 1779 ? ?

## 2021-05-24 NOTE — Progress Notes (Signed)
BLE venous duplex has been completed.  Preliminary results given to Dr. Armandina Gemma. ? ? ?Results can be found under chart review under CV PROC. ?05/24/2021 1:09 PM ?Tonya Quinn RVT, RDMS ? ?

## 2021-05-24 NOTE — Discharge Instructions (Addendum)
You were evaluated in the Emergency Department and after careful evaluation, we did not find any emergent condition requiring admission or further testing in the hospital. ? ?Your exam/testing today was overall reassuring.  Your CT scan did not show any abnormalities in your lungs.  No evidence of blood clots or pneumonia.  Your ultrasound of your lower extremity showed no evidence of blood clots in your legs.  Your nosebleed has since resolved.  Recommend that you take Afrin for no more than three days. Return to the ED for any recurrence of a nose bleed that will not resolved with pressure/compression.  You likely have acute bronchitis from a viral infection. ? ?Please return to the Emergency Department if you experience any worsening of your condition.  Thank you for allowing [place] to be a part of your care. ? ?

## 2021-05-24 NOTE — ED Triage Notes (Signed)
Patient complains of intermittent epistaxis and hemoptysis for the last week. Patient also complains of muscle spasm in left side of neck and diffuse abdominal pain that also started approximately one week ago. Patient alert, oriented, and in no apparent distress at this time. ?

## 2021-05-24 NOTE — ED Notes (Signed)
Got patient on the monitor patient is resting with call bell in reach  ?

## 2021-05-24 NOTE — ED Notes (Signed)
Patient transported to X-ray 

## 2021-05-24 NOTE — ED Provider Notes (Signed)
[facility] EMERGENCY DEPARTMENT Provider Note   CSN: [id number] Arrival date & time: 05/24/21  8099     History  Chief Complaint  Patient presents with   Hemoptysis    Tonya Quinn is a 56 y.o. female.  HPI    56 year old female DM2 on Ozempic, Jardiance and Tresiba, HTN on Propafenone and Candesartan, atrial fibrillation not on anticoagulation, HLD, obesity, OSA who presents to the emergency department with hemoptysis.  The patient has had intermittent episodes of epistaxis over the past week.  She also endorses hemoptysis, coughing up small blood clots. She endorses red tinged sputum. She had blood in the back of her throat this morning but had no active epistaxis this morning.  She endorses some bilateral discomfort in her upper abdomen around her diaphragm after coughing.  She denies any fevers or chills or shortness of breath.  Additionally, she endorses cramping in her left posterior calf.  She endorses chronic right lower extremity swelling.  She denies any history of DVT or PE.  She is not currently on anticoagulation.  Home Medications Prior to Admission medications   Medication Sig Start Date End Date Taking? Authorizing Provider  oxymetazoline (AFRIN NASAL SPRAY) 0.05 % nasal spray Place 1 spray into both nostrils 2 (two) times daily for 3 days. 05/24/21 05/27/21 Yes Regan Lemming, MD  sodium chloride (OCEAN) 0.65 % SOLN nasal spray Place 1 spray into both nostrils as needed for congestion. 05/24/21  Yes Regan Lemming, MD  acetaminophen (TYLENOL) 500 MG tablet Take 1 tablet (500 mg total) by mouth every 6 (six) hours as needed. 04/06/20   Laban Emperor, PA-C  acetaminophen-codeine 120-12 MG/5ML solution Take 5 mLs by mouth every 6 (six) hours as needed (cough). 03/30/21   Tonye Pearson, PA-C  albuterol (PROVENTIL HFA;VENTOLIN HFA) 108 (90 Base) MCG/ACT inhaler Inhale into the lungs.    [provider]  ALPRAZolam Duanne Moron) 0.25 MG tablet Take  0.25 mg by mouth at bedtime as needed. For anxiety    [provider]  amLODipine (NORVASC) 10 MG tablet Take 10 mg by mouth daily.    [provider]  atenolol (TENORMIN) 50 MG tablet Take 25 mg by mouth daily.    [provider]  azithromycin (ZITHROMAX Z-PAK) 250 MG tablet 2 po day one, then 1 daily x 4 days 06/02/16   Charlesetta Shanks, MD  Baclofen 5 MG TABS Take 5 mg by mouth 3 (three) times daily as needed. 04/06/20   Laban Emperor, PA-C  benzonatate (TESSALON) 100 MG capsule Take 1 capsule (100 mg total) by mouth every 8 (eight) hours. 06/02/16   Charlesetta Shanks, MD  cephALEXin (KEFLEX) 500 MG capsule Take 1 capsule (500 mg total) by mouth 4 (four) times daily. 08/06/17   Law, Bea Graff, PA-C  Cetirizine HCl (ZYRTEC ALLERGY) 10 MG CAPS Take 1 capsule (10 mg total) by mouth daily. 05/14/16   Blanchie Dessert, MD  Cholecalciferol (VITAMIN D PO) Take 1 tablet by mouth daily.    [provider]  diclofenac sodium (VOLTAREN) 1 % GEL Apply 4 g topically 4 (four) times daily. 07/15/15   Palumbo, April, MD  FLUoxetine HCl (PROZAC PO) Take by mouth.    [provider]  glipiZIDE (GLUCOTROL) 5 MG tablet Take 5 mg by mouth daily before breakfast.    [provider]  HYDROcodone-homatropine (HYCODAN) 5-1.5 MG/5ML syrup Take 5 mLs by mouth every 6 (six) hours as needed for cough. 06/02/16   Pfeiffer,  Jeannie Done, MD  Insulin Lispro (HUMALOG [place]) Inject into the skin.    [provider]  loratadine (CLARITIN) 10 MG tablet Take 10 mg by mouth daily as needed for allergies.    [provider]  losartan (COZAAR) 100 MG tablet Take 100 mg by mouth daily.    [provider]  metFORMIN (GLUCOPHAGE) 1000 MG tablet Take 1,000 mg by mouth daily with breakfast.    [provider]  methocarbamol (ROBAXIN) 500 MG tablet Take 1 tablet (500 mg total) by mouth 2 (two) times daily. 07/03/20   Suzy Bouchard, PA-C  omeprazole (PRILOSEC) 20 MG  capsule Take 1 capsule (20 mg total) by mouth daily. 02/01/16   Charlesetta Shanks, MD  predniSONE (DELTASONE) 20 MG tablet 3 tabs po daily x 3 days, then 2 tabs x 3 days, then 1.5 tabs x 3 days, then 1 tab x 3 days, then 0.5 tabs x 3 days 06/10/16   Orpah Greek, MD  ranitidine (ZANTAC) 150 MG capsule Take 1 capsule (150 mg total) by mouth daily. 07/18/17   Fatima Blank, MD  thalidomide (THALOMID) 50 MG capsule Take 25 mg by mouth at bedtime. Take with water.    [provider]  tiZANidine (ZANAFLEX) 4 MG capsule Take 4 mg by mouth 3 (three) times daily.    [provider]      Allergies    Fluoxetine, Lisinopril, Sertraline, and Insulin glargine    Review of Systems   Review of Systems  All other systems reviewed and are negative.  Physical Exam Updated Vital Signs BP (!) 154/92    Pulse 77    Temp 98.2 F (36.8 C) (Oral)    Resp 17    LMP 02/16/2012    SpO2 94%  Physical Exam Vitals and nursing note reviewed.  Constitutional:      General: She is not in acute distress.    Appearance: She is obese.  HENT:     Head: Normocephalic and atraumatic.     Nose: Nose normal.     Right Nostril: No epistaxis or septal hematoma.     Left Nostril: No epistaxis or septal hematoma.     Comments: No active epistaxis noted    Mouth/Throat:     Mouth: Mucous membranes are moist.     Pharynx: Oropharynx is clear. Uvula midline.     Tonsils: No tonsillar exudate or tonsillar abscesses.     Comments: No evidence of blood in the posterior oropharynx Eyes:     Conjunctiva/sclera: Conjunctivae normal.     Pupils: Pupils are equal, round, and reactive to light.  Cardiovascular:     Rate and Rhythm: Normal rate and regular rhythm.     Heart sounds: Normal heart sounds.  Pulmonary:     Effort: Pulmonary effort is normal. No respiratory distress.     Breath sounds: Normal breath sounds.  Abdominal:     General: There is no distension.     Tenderness: There is no  guarding.  Musculoskeletal:        General: No deformity or signs of injury.     Cervical back: Neck supple.     Right lower leg: Edema present.     Left lower leg: No edema.  Skin:    Findings: No lesion or rash.  Neurological:     General: No focal deficit present.     Mental Status: She is alert. Mental status is at baseline.    ED Results /  Procedures / Treatments   Labs (all labs ordered are listed, but only abnormal results are displayed) Labs Reviewed  COMPREHENSIVE METABOLIC PANEL - Abnormal; Notable for the following components:      Result Value   Glucose, Bld 194 (*)    All other components within normal limits  URINALYSIS, ROUTINE W REFLEX MICROSCOPIC - Abnormal; Notable for the following components:   Color, Urine STRAW (*)    Glucose, UA >=500 (*)    Bacteria, UA RARE (*)    All other components within normal limits  D-DIMER, QUANTITATIVE - Abnormal; Notable for the following components:   D-Dimer, Quant 0.60 (*)    All other components within normal limits  RESP PANEL BY RT-PCR (FLU A&B, COVID) ARPGX2  LIPASE, BLOOD  CBC    EKG EKG Interpretation  Date/Time:  Monday May 24 2021 09:01:10 EDT Ventricular Rate:  76 PR Interval:  166 QRS Duration: 82 QT Interval:  418 QTC Calculation: 470 R Axis:   -3 Text Interpretation: Normal sinus rhythm Minimal voltage criteria for LVH, may be normal variant ( R in aVL ) Inferior infarct , age undetermined Possible Anterior infarct , age undetermined Abnormal ECG When compared with ECG of 03-Jul-2020 10:43, PREVIOUS ECG IS PRESENT Confirmed by Regan Lemming ([id number]) on 05/24/2021 10:34:10 AM  Radiology DG Chest 2 View  Result Date: 05/24/2021 CLINICAL DATA:  Epistaxis and hemoptysis EXAM: CHEST - 2 VIEW COMPARISON:  03/30/2021 FINDINGS: The heart size and mediastinal contours are within normal limits. Both lungs are clear. The visualized skeletal structures are unremarkable. IMPRESSION: No active cardiopulmonary  disease. Electronically Signed   By: Nelson Chimes M.D.   On: 05/24/2021 10:10   VAS [place] LOWER EXTREMITY VENOUS (DVT) (ONLY [facility] & [facility])  Result Date: 05/24/2021  Lower Venous DVT Study Patient Name:  LAISHA RAU Sassano  Date of Exam:   05/24/2021 Medical Rec #: [id number]         Accession #:    [id number] Date of Birth: 10/19/1965         Patient Gender: F Patient Age:   63 years Exam Location:  [facility] Procedure:      VAS [place] LOWER EXTREMITY VENOUS (DVT) Referring Phys: Regan Lemming --------------------------------------------------------------------------------  Indications: Pain.  Limitations: Body habitus and poor ultrasound/tissue interface. Comparison Study: No previous exams Performing Technologist: Jody Hill RVT, RDMS  Examination Guidelines: A complete evaluation includes B-mode imaging, spectral Doppler, color Doppler, and power Doppler as needed of all accessible portions of each vessel. Bilateral testing is considered an integral part of a complete examination. Limited examinations for reoccurring indications may be performed as noted. The reflux portion of the exam is performed with the patient in reverse Trendelenburg.  +---------+---------------+---------+-----------+----------+-------------------+  RIGHT     Compressibility Phasicity Spontaneity Properties Thrombus Aging       +---------+---------------+---------+-----------+----------+-------------------+  CFV       Full            Yes       Yes                                         +---------+---------------+---------+-----------+----------+-------------------+  SFJ       Full                                                                  +---------+---------------+---------+-----------+----------+-------------------+  FV Prox   Full            Yes       Yes                                         +---------+---------------+---------+-----------+----------+-------------------+  FV Mid    Full            Yes       Yes                                          +---------+---------------+---------+-----------+----------+-------------------+  FV Distal Full            Yes       Yes                                         +---------+---------------+---------+-----------+----------+-------------------+  PFV       Full                                                                  +---------+---------------+---------+-----------+----------+-------------------+  POP       Full            Yes       Yes                                         +---------+---------------+---------+-----------+----------+-------------------+  PTV       Full                                             Not well visualized  +---------+---------------+---------+-----------+----------+-------------------+  PERO      Full                                             Not well visualized  +---------+---------------+---------+-----------+----------+-------------------+   +---------+---------------+---------+-----------+----------+-------------------+  LEFT      Compressibility Phasicity Spontaneity Properties Thrombus Aging       +---------+---------------+---------+-----------+----------+-------------------+  CFV       Full            Yes       Yes                                         +---------+---------------+---------+-----------+----------+-------------------+  SFJ       Full                                                                  +---------+---------------+---------+-----------+----------+-------------------+  FV Prox   Full            Yes       Yes                                         +---------+---------------+---------+-----------+----------+-------------------+  FV Mid    Full            Yes       Yes                                         +---------+---------------+---------+-----------+----------+-------------------+  FV Distal Full            Yes       Yes                                          +---------+---------------+---------+-----------+----------+-------------------+  PFV       Full                                                                  +---------+---------------+---------+-----------+----------+-------------------+  POP       Full            Yes       Yes                                         +---------+---------------+---------+-----------+----------+-------------------+  PTV       Full                                             Not well visualized  +---------+---------------+---------+-----------+----------+-------------------+  PERO      Full                                                                  +---------+---------------+---------+-----------+----------+-------------------+     *See table(s) above for measurements and observations.    Preliminary     Procedures Procedures    Medications Ordered in ED Medications  oxymetazoline (AFRIN) 0.05 % nasal spray 1 spray (has no administration in time range)  iohexol (OMNIPAQUE) 350 MG/ML injection 100 mL (100 mLs Intravenous Contrast Given 05/24/21 1143)    ED Course/ Medical Decision Making/ A&P Clinical Course as of 05/24/21 1340  Mon May 24, 2021  1133 D-Dimer, Quant(!): 0.60 [JL]    Clinical Course User Index [JL] Regan Lemming, MD                           Medical Decision Making Amount and/or  Complexity of Data Reviewed Labs: ordered. Decision-making details documented in ED Course. Radiology: ordered.  Risk OTC drugs. Prescription drug management.   56 year old female DM2 on Ozempic, Jardiance and Tresiba, HTN on Propafenone and Candesartan, atrial fibrillation not on anticoagulation, HLD, obesity, OSA who presents to the emergency department with hemoptysis.  The patient has had intermittent episodes of epistaxis over the past week.  She also endorses hemoptysis, coughing up small blood clots. She endorses red tinged sputum. She had blood in the back of her throat this morning but had no  active epistaxis this morning.  She endorses some bilateral discomfort in her upper abdomen around her diaphragm after coughing.  She denies any fevers or chills or shortness of breath.  Additionally, she endorses cramping in her left posterior calf.  She endorses chronic right lower extremity swelling.  She denies any history of DVT or PE.  She is not currently on anticoagulation.  On arrival, the patient was vitally stable.  Sinus rhythm noted on cardiac telemetry.  Differential diagnosis of the patient's hemoptysis includes aspiration of blood from epistaxis, acute bronchitis, PE, lung mass/malignancy.  The patient's epistaxis appears to have resolved with no evidence of active epistaxis at this time.  She presents with URI symptoms with cough and pink sputum present with some coughing of clots.  She has had several episodes of epistaxis over the past  Few days which appears to have resolved at this time.  A D-dimer was collected which resulted elevated at 0.60.  Remainder the patient's laboratory work-up was generally unremarkable with negative COVID-19 and influenza PCR testing, normal lipase, CMP generally unremarkable with mild hyperglycemia 194, CBC without a leukocytosis or anemia.  Chest x-ray was performed which revealed no acute cardiac or pulmonary abnormality, reviewed by myself and radiology.  DVT ultrasound study was performed bilaterally which revealed no evidence of DVT.  Suspect likely venous stasis changes and possible cramping of the patient's musculature in her right calf causing the patient's symptoms.  No evidence of significant electrolyte abnormality on laboratory evaluation.  A CTA PE study was performed which revealed no evidence of lung mass, pneumonia or acute PE.  Patient symptoms are likely multifactorial but likely due to recurrent epistaxis, viral URI with resultant bronchitis.  A referral was placed to ENT for follow-up in clinic.   The patient has been  appropriately medically screened and/or stabilized in the ED. I have low suspicion for any other emergent medical condition which would require further screening, evaluation or treatment in the ED or require inpatient management.  Final Clinical Impression(s) / ED Diagnoses Final diagnoses:  Hemoptysis  Epistaxis  Bronchitis    Rx / DC Orders ED Discharge Orders          Ordered    Ambulatory referral to ENT        05/24/21 1338    sodium chloride (OCEAN) 0.65 % SOLN nasal spray  As needed        05/24/21 1340    oxymetazoline (AFRIN NASAL SPRAY) 0.05 % nasal spray  2 times daily        05/24/21 1340              Regan Lemming, MD 05/24/21 1340

## 2021-08-05 ENCOUNTER — Other Ambulatory Visit: Payer: Self-pay

## 2021-08-05 DIAGNOSIS — I10 Essential (primary) hypertension: Secondary | ICD-10-CM | POA: Insufficient documentation

## 2021-08-05 DIAGNOSIS — R600 Localized edema: Secondary | ICD-10-CM | POA: Insufficient documentation

## 2021-08-05 DIAGNOSIS — G4730 Sleep apnea, unspecified: Secondary | ICD-10-CM | POA: Insufficient documentation

## 2021-08-05 DIAGNOSIS — E876 Hypokalemia: Secondary | ICD-10-CM | POA: Insufficient documentation

## 2021-08-05 DIAGNOSIS — Z6841 Body Mass Index (BMI) 40.0 and over, adult: Secondary | ICD-10-CM | POA: Insufficient documentation

## 2021-08-05 DIAGNOSIS — M2550 Pain in unspecified joint: Secondary | ICD-10-CM

## 2021-08-05 DIAGNOSIS — E1169 Type 2 diabetes mellitus with other specified complication: Secondary | ICD-10-CM | POA: Insufficient documentation

## 2021-08-05 DIAGNOSIS — K219 Gastro-esophageal reflux disease without esophagitis: Secondary | ICD-10-CM | POA: Insufficient documentation

## 2021-08-05 DIAGNOSIS — M159 Polyosteoarthritis, unspecified: Secondary | ICD-10-CM | POA: Insufficient documentation

## 2021-08-05 HISTORY — DX: Pain in unspecified joint: M25.50

## 2021-08-27 ENCOUNTER — Other Ambulatory Visit: Payer: Self-pay

## 2021-08-30 ENCOUNTER — Encounter: Payer: Self-pay | Admitting: Cardiology

## 2021-08-30 ENCOUNTER — Ambulatory Visit (INDEPENDENT_AMBULATORY_CARE_PROVIDER_SITE_OTHER): Payer: Medicare Other | Admitting: Cardiology

## 2021-08-30 ENCOUNTER — Ambulatory Visit (INDEPENDENT_AMBULATORY_CARE_PROVIDER_SITE_OTHER): Payer: Medicare Other

## 2021-08-30 VITALS — BP 134/85 | HR 83 | Ht 66.0 in | Wt 300.2 lb

## 2021-08-30 DIAGNOSIS — Z6841 Body Mass Index (BMI) 40.0 and over, adult: Secondary | ICD-10-CM

## 2021-08-30 DIAGNOSIS — I10 Essential (primary) hypertension: Secondary | ICD-10-CM | POA: Diagnosis not present

## 2021-08-30 DIAGNOSIS — E785 Hyperlipidemia, unspecified: Secondary | ICD-10-CM

## 2021-08-30 DIAGNOSIS — I480 Paroxysmal atrial fibrillation: Secondary | ICD-10-CM

## 2021-08-30 DIAGNOSIS — E1169 Type 2 diabetes mellitus with other specified complication: Secondary | ICD-10-CM

## 2021-08-30 DIAGNOSIS — R5382 Chronic fatigue, unspecified: Secondary | ICD-10-CM

## 2021-08-30 DIAGNOSIS — R079 Chest pain, unspecified: Secondary | ICD-10-CM

## 2021-08-30 DIAGNOSIS — E1141 Type 2 diabetes mellitus with diabetic mononeuropathy: Secondary | ICD-10-CM

## 2021-08-30 HISTORY — DX: Paroxysmal atrial fibrillation: I48.0

## 2021-08-30 HISTORY — DX: Chronic fatigue, unspecified: R53.82

## 2021-08-30 HISTORY — DX: Chest pain, unspecified: R07.9

## 2021-08-30 MED ORDER — RIVAROXABAN 20 MG PO TABS: 20.0000 mg | ORAL_TABLET | Freq: Every day | ORAL | 12 refills | Status: DC

## 2021-08-30 NOTE — Progress Notes (Signed)
Cardiology Office Note:    Date:  08/30/2021   ID:  Tonya Quinn, DOB 1965/04/22, MRN [id number]  PCP:  Cher Nakai, MD  Cardiologist:  Jenean Lindau, MD   Referring MD: Cher Nakai, MD    ASSESSMENT:    1. Essential hypertension   2. PAF (paroxysmal atrial fibrillation) ([place])   3. Hyperlipidemia associated with type 2 diabetes mellitus ([place])   4. Adult BMI 45.0-49.9 kg/sq m ([place])   5. Diabetic mononeuropathy associated with type 2 diabetes mellitus (H[facility])   6. Chest pain of uncertain etiology    PLAN:    In order of problems listed above:  Primary prevention stressed with the patient.  Importance of compliance with diet and medication stressed and she vocalized understanding. Paroxysmal atrial fibrillation:I discussed with the patient atrial fibrillation, disease process. Management and therapy including rate and rhythm control, anticoagulation benefits and potential risks were discussed extensively with the patient. Patient had multiple questions which were answered to patient's satisfaction.  She denies any history of bleeding or any such problems.  I initiated her on anticoagulation Xarelto 20 mg daily with food.  She understands.  She will be on the watch out for dark stools or any such issues.  She has not had any issues in the past.  She does not remember why she quit anticoagulation.  She was on warfarin at some point.  She tells me it was inconvenient for her. Essential hypertension: Blood pressure stable and diet was emphasized.  Lifestyle modification urged. Cardiac murmur: Echocardiogram will be done to assess murmur heard on auscultation. Chest pain: Atypical in nature but in view of risk factors we will do a Lexiscan sestamibi. Morbid obesity: Weight reduction stressed diet emphasized and she promises to do better. She occasionally has palpitations and we will do a 2-week monitor to see if there is any recurrence of paroxysms of atrial fibrillation so we can titrate  therapy as needed.  I also encouraged her to buy a cardia app to self monitor. Patient will be seen in follow-up appointment in 6 months or earlier if the patient has any concerns   Medication Adjustments/Labs and Tests Ordered: Current medicines are reviewed at length with the patient today.  Concerns regarding medicines are outlined above.  No orders of the defined types were placed in this encounter.  No orders of the defined types were placed in this encounter.    History of Present Illness:    Latronda Quinn is a 56 y.o. female who is being seen today for the evaluation of chest pain and paroxysmal atrial fibrillation at the request of Cher Nakai, MD. patient is a pleasant 56 year old female.  She has past medical history of essential hypertension, mixed dyslipidemia, diabetes mellitus and paroxysmal atrial fibrillation.  She denies any problems at this time except occasional chest discomfort.  It is not related to exertion.  She leads a sedentary lifestyle.  At the time of my evaluation, the patient is alert awake oriented and in no distress.  She is here to be established.  Interestingly she is not on anticoagulation and tells me that she has not tolerated it in the past.  Past Medical History:  Diagnosis Date   Adult BMI 45.0-49.9 kg/sq m [facility])    ALLERGIC RHINITIS 07/05/2006   Qualifier: Diagnosis of  By: Milinda Antis  MD, Adlih     Anxiety 04/07/2015   Asthma    Atrial fibrillation ([place])    Atrial flutter ([place]) 01/02/2015  BACK PAIN 07/05/2006   Qualifier: Diagnosis of  By: Moreno-Coll  MD, Adlih     Benign essential hypertension    Bronchitis 11/24/2014   Cervical spine pain 07/11/2019   Chronic midline thoracic back pain 07/11/2019   Common cold 11/04/2014   Constipation    Constipation by delayed colonic transit 09/10/2014   Current use of insulin ([place]) 10/16/2018   DDD (degenerative disc disease), lumbar    Degeneration of intervertebral disc of lumbar region 07/13/2015    Degenerative disc disease, 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   Diabetes mellitus without complication ([place]) 0/53/9767   Diabetic neuropathy associated with type 2 diabetes mellitus ([place]) 09/12/2017   Diabetic polyneuropathy associated with type 2 diabetes mellitus ([place]) 10/23/2018   Dyspnea, unspecified 01/02/2015   Dysuria 07/05/2006   Qualifier: Diagnosis of  By: Dalbert Mayotte     Elevated lactic acid level 01/02/2015   Essential hypertension    Excessive sleepiness 11/12/2013   Fibroids 03/01/2012   Fibromyalgia 11/12/2013   Generalized anxiety disorder 09/19/2014   Generalized osteoarthritis of multiple sites    GERD (gastroesophageal reflux disease)    Heart palpitations 01/02/2015   HLD (hyperlipidemia)    Hot flash, menopausal 09/24/2015   Formatting of this note might be different from the original. Pt with multiple complaints that have been ongoing since prior to her hyst.  LAVH/BSO in 2013 at [facility].  C/o bloating, breast tenderness, mood changes, pelvic pain. Check labs as below to eval for menopause vs ? Ovarian remnant.  Imaging reviewed as far back as 2015. Benign.  RTC 4wks for follow up. Again discussed that her symptoms a   Hyperglycemia 01/02/2015   Hyperlipidemia associated with type 2 diabetes mellitus ([place]) 12/27/2018   Hyperopia of both eyes with astigmatism and presbyopia 10/16/2018   Hypertension    Hypertension associated with type 2 diabetes mellitus ([place]) 07/13/2015   Hypokalemia    Hypomagnesemia    Hypothyroidism    Left-sided weakness 04/07/2015   Long term current use of oral hypoglycemic drug 10/16/2018   LOW BACK PAIN, CHRONIC 07/05/2006   Qualifier: Diagnosis of  By: Milinda Antis  MD, Adlih     Lower extremity edema    Major depressive disorder, recurrent episode, moderate ([place]) 11/27/2012   Microalbuminuria due to type 2 diabetes mellitus ([place]) 07/14/2018   Morbid obesity ([place])    Nuclear sclerotic cataract of both eyes 10/16/2018   Obesity    PMS (premenstrual syndrome)  12/28/2017   Polyarthralgia 08/05/2021   Postmenopausal bleeding 12/28/2017   Sepsis ([place]) 01/02/2015   Sleep apnea    Status post hysterectomy 09/30/2019   SVT (supraventricular tachycardia) ([place]) 01/02/2015   Type 2 diabetes mellitus with diabetic neuropathy, with long-term current use of insulin ([place]) 12/27/2018   Type 2 diabetes mellitus with obesity ([place])    Uncontrolled type 2 diabetes mellitus with hyperglycemia ([place]) 04/07/2015   Formatting of this note might be different from the original. Uncontrolled. A1c 7/17- 10.9   Uninsured medical expenses 09/10/2014   Vaginal dryness, menopausal 07/13/2015   Formatting of this note might be different from the original. Some atrophy present. She desires oral estrogen, but will not rx given her multiple comorbidities. Start vaginal premarin RTC 3 months for f/u.   Vitreous floater, bilateral 10/16/2018   Weakness 04/08/2015    Past Surgical History:  Procedure Laterality Date   ABDOMINAL HYSTERECTOMY     CYSTOSCOPY  02/29/2012   Procedure: CYSTOSCOPY;  Surgeon: Cheri Fowler, MD;  Location: [facility];  Service: Gynecology;  Laterality: N/A;   LAPAROSCOPIC ASSISTED VAGINAL HYSTERECTOMY  02/29/2012   Procedure: LAPAROSCOPIC ASSISTED VAGINAL HYSTERECTOMY;  Surgeon: Cheri Fowler, MD;  Location: [facility] [facility];  Service: Gynecology;  Laterality: N/A;   SALPINGOOPHORECTOMY  02/29/2012   Procedure: SALPINGO OOPHORECTOMY;  Surgeon: Cheri Fowler, MD;  Location: [facility];  Service: Gynecology;  Laterality: Bilateral;   uterine ablation     VEIN SURGERY      Current Medications: Current Meds  Medication Sig   albuterol (PROVENTIL HFA;VENTOLIN HFA) 108 (90 Base) MCG/ACT inhaler Inhale 2 puffs into the lungs as needed for wheezing or shortness of breath.   aspirin EC 325 MG tablet Take 325 mg by mouth daily.   cetirizine (ZYRTEC) 10 MG tablet Take 10 mg by mouth at bedtime.   famotidine (PEPCID) 20 MG tablet Take 20 mg by mouth 2 (two) times daily.    Fluticasone-Umeclidin-Vilant 100-62.5-25 MCG/ACT AEPB Inhale 1 puff into the lungs daily.   hydrOXYzine (VISTARIL) 25 MG capsule Take 25 mg by mouth every 6 (six) hours as needed for dizziness.   JARDIANCE 25 MG TABS tablet Take 25 mg by mouth daily.   linaclotide (LINZESS) 72 MCG capsule Take 72 mcg by mouth daily.   magnesium gluconate (MAGONATE) 500 MG tablet Take 500 mg by mouth daily.   meloxicam (MOBIC) 15 MG tablet Take 15 mg by mouth daily.   methocarbamol (ROBAXIN) 500 MG tablet Take 1 tablet (500 mg total) by mouth 2 (two) times daily.   montelukast (SINGULAIR) 10 MG tablet Take 10 mg by mouth daily.   olmesartan (BENICAR) 20 MG tablet Take 20 mg by mouth daily.   OZEMPIC, 1 MG/DOSE, 4 MG/3ML SOPN Inject 1 mg into the skin once a week.   potassium chloride SA (KLOR-CON M) 20 MEQ tablet Take 20 mEq by mouth every other day.   propafenone (RYTHMOL SR) 325 MG 12 hr capsule Take 325 mg by mouth daily.   sodium chloride (OCEAN) 0.65 % SOLN nasal spray Place 1 spray into both nostrils as needed for congestion.   torsemide (DEMADEX) 10 MG tablet Take 10 mg by mouth daily.   TRESIBA 100 UNIT/ML SOLN Inject 24 Units into the skin at bedtime.     Allergies:   Hydrochlorothiazide, Topiramate, Insulin lispro, Fluoxetine, Lisinopril, Sertraline, Insulin glargine, and Metformin and related   Social History   Socioeconomic History   Marital status: Widowed    Spouse name: Not on file   Number of children: Not on file   Years of education: Not on file   Highest education level: Not on file  Occupational History   Not on file  Tobacco Use   Smoking status: Never   Smokeless tobacco: Never  Vaping Use   Vaping Use: Never used  Substance and Sexual Activity   Alcohol use: No   Drug use: No   Sexual activity: Not on file  Other Topics Concern   Not on file  Social History Narrative   Not on file   Social Determinants of Health   Financial Resource Strain: Not on file  Food  Insecurity: Not on file  Transportation Needs: Not on file  Physical Activity: Not on file  Stress: Not on file  Social Connections: Not on file     Family History: The patient's family history includes Diabetes in her brother, maternal grandmother, and paternal grandmother; Heart disease in her maternal grandmother; Hypertension in her brother, maternal grandmother, and paternal grandmother.  ROS:   Please see the history of  present illness.    All other systems reviewed and are negative.  EKGs/Labs/Other Studies Reviewed:    The following studies were reviewed today: EKG reveals sinus rhythm and nonspecific ST changes   Recent Labs: 05/24/2021: ALT 17; BUN 14; Creatinine, Ser 0.78; Hemoglobin 13.2; Platelets 176; Potassium 4.0; Sodium 140  Recent Lipid Panel    Component Value Date/Time   CHOL 216 (H) 04/07/2015 0125   TRIG 139 04/07/2015 0125   HDL 56 04/07/2015 0125   CHOLHDL 3.9 04/07/2015 0125   VLDL 28 04/07/2015 0125   LDLCALC 132 (H) 04/07/2015 0125    Physical Exam:    VS:  BP 134/85   Pulse 83   Ht '5\' 6"'$  (1.676 m)   Wt (!) 300 lb 3.2 oz (136.2 kg)   LMP 02/16/2012   SpO2 97%   BMI 48.45 kg/m     Wt Readings from Last 3 Encounters:  08/30/21 (!) 300 lb 3.2 oz (136.2 kg)  08/08/18 (!) 303 lb (137.4 kg)  04/07/18 296 lb (134.3 kg)     GEN: Patient is in no acute distress HEENT: Normal NECK: No JVD; No carotid bruits LYMPHATICS: No lymphadenopathy CARDIAC: S1 S2 regular, 2/6 systolic murmur at the apex. RESPIRATORY:  Clear to auscultation without rales, wheezing or rhonchi  ABDOMEN: Soft, non-tender, non-distended MUSCULOSKELETAL:  No edema; No deformity  SKIN: Warm and dry NEUROLOGIC:  Alert and oriented x 3 PSYCHIATRIC:  Normal affect    Signed, Jenean Lindau, MD  08/30/2021 2:51 PM    [facility]

## 2021-08-30 NOTE — Patient Instructions (Signed)
Medication Instructions:  Your physician has recommended you make the following change in your medication:   Start Xarelto    *If you need a refill on your cardiac medications before your next appointment, please call your pharmacy*   Lab Work: None ordered If you have labs (blood work) drawn today and your tests are completely normal, you will receive your results only by: [place] (if you have MyChart) OR A paper copy in the mail If you have any lab test that is abnormal or we need to change your treatment, we will call you to review the results.   Testing/Procedures: Your physician has requested that you have a lexiscan myoview. For further information please visit [email or web address]. Please follow instruction sheet, as given.  The test will be done over 2 days and take approximately 3 to 4 hours each day to complete; you may bring reading material.  If someone comes with you to your appointment, they will need to remain in the main lobby due to limited space in the testing area.  How to prepare for your Myocardial Perfusion Test: Do not eat or drink 3 hours prior to your test, except you may have water. Do not consume products containing caffeine (regular or decaffeinated) 12 hours prior to your test. (ex: coffee, chocolate, sodas, tea). Do bring a list of your current medications with you.  If not listed below, you may take your medications as normal. Do wear comfortable clothes (no dresses or overalls) and walking shoes, tennis shoes preferred (No heels or open toe shoes are allowed). Do NOT wear cologne, perfume, aftershave, or lotions (deodorant is allowed). If these instructions are not followed, your test will have to be rescheduled.  Your physician has requested that you have an echocardiogram. Echocardiography is a painless test that uses sound waves to create images of your heart. It provides your doctor with information about the size and shape of your heart and  how well your heart's chambers and valves are working. This procedure takes approximately one hour.    WHY IS MY DOCTOR PRESCRIBING ZIO? The Zio system is proven and trusted by physicians to detect and diagnose irregular heart rhythms -- and has been prescribed to hundreds of thousands of patients.  The FDA has cleared the Zio system to monitor for many different kinds of irregular heart rhythms. In a study, physicians were able to reach a diagnosis 90% of the time with the Zio system1.  You can wear the Zio monitor -- a small, discreet, comfortable patch -- during your normal day-to-day activity, including while you sleep, shower, and exercise, while it records every single heartbeat for analysis.  1Barrett, P., et al. Comparison of 24 Hour Holter Monitoring Versus 14 Day Novel Adhesive Patch Electrocardiographic Monitoring. [place], 2014.  ZIO VS. HOLTER MONITORING The Zio monitor can be comfortably worn for up to 14 days. Holter monitors can be worn for 24 to 48 hours, limiting the time to record any irregular heart rhythms you may have. Zio is able to capture data for the 51% of patients who have their first symptom-triggered arrhythmia after 48 hours.1  LIVE WITHOUT RESTRICTIONS The Zio ambulatory cardiac monitor is a small, unobtrusive, and water-resistant patch--you might even forget you're wearing it. The Zio monitor records and stores every beat of your heart, whether you're sleeping, working out, or showering. Wear the monitor for 14 days, remove 09/13/21.  Follow-Up: At [facility], you and your health needs are our priority.  As  part of our continuing mission to provide you with exceptional heart care, we have created designated Provider Care Teams.  These Care Teams include your primary Cardiologist (physician) and Advanced Practice Providers (APPs -  Physician Assistants and Nurse Practitioners) who all work together to provide you with the care you need, when  you need it.  We recommend signing up for the patient portal called "MyChart".  Sign up information is provided on this After Visit Summary.  MyChart is used to connect with patients for Virtual Visits (Telemedicine).  Patients are able to view lab/test results, encounter notes, upcoming appointments, etc.  Non-urgent messages can be sent to your provider as well.   To learn more about what you can do with MyChart, go to [email or web address].    Your next appointment:   6 month(s)  The format for your next appointment:   In Person  Provider:   Jyl Heinz, MD   Other Instructions Cardiac Nuclear Scan A cardiac nuclear scan is a test that is done to check the flow of blood to your heart. It is done when you are resting and when you are exercising. The test looks for problems such as: Not enough blood reaching a portion of the heart. The heart muscle not working as it should. You may need this test if: You have heart disease. You have had lab results that are not normal. You have had heart surgery or a balloon procedure to open up blocked arteries (angioplasty). You have chest pain. You have shortness of breath. In this test, a special dye (tracer) is put into your bloodstream. The tracer will travel to your heart. A camera will then take pictures of your heart to see how the tracer moves through your heart. This test is usually done at a hospital and takes 2-4 hours. Tell a doctor about: Any allergies you have. All medicines you are taking, including vitamins, herbs, eye drops, creams, and over-the-counter medicines. Any problems you or family members have had with anesthetic medicines. Any blood disorders you have. Any surgeries you have had. Any medical conditions you have. Whether you are pregnant or may be pregnant. What are the risks? Generally, this is a safe test. However, problems may occur, such as: Serious chest pain and heart attack. This is only a risk if the  stress portion of the test is done. Rapid heartbeat. A feeling of warmth in your chest. This feeling usually does not last long. Allergic reaction to the tracer. What happens before the test? Ask your doctor about changing or stopping your normal medicines. This is important. Follow instructions from your doctor about what you cannot eat or drink. Remove your jewelry on the day of the test. What happens during the test? An IV tube will be inserted into one of your veins. Your doctor will give you a small amount of tracer through the IV tube. You will wait for 20-40 minutes while the tracer moves through your bloodstream. Your heart will be monitored with an electrocardiogram (ECG). You will lie down on an exam table. Pictures of your heart will be taken for about 15-20 minutes. You may also have a stress test. For this test, one of these things may be done: You will be asked to exercise on a treadmill or a stationary bike. You will be given medicines that will make your heart work harder. This is done if you are unable to exercise. When blood flow to your heart has peaked, a tracer will again be  given through the IV tube. After 20-40 minutes, you will get back on the exam table. More pictures will be taken of your heart. Depending on the tracer that is used, more pictures may need to be taken 3-4 hours later. Your IV tube will be removed when the test is over. The test may vary among doctors and hospitals. What happens after the test? Ask your doctor: Whether you can return to your normal schedule, including diet, activities, and medicines. Whether you should drink more fluids. This will help to remove the tracer from your body. Drink enough fluid to keep your pee (urine) pale yellow. Ask your doctor, or the department that is doing the test: When will my results be ready? How will I get my results? Summary A cardiac nuclear scan is a test that is done to check the flow of blood to  your heart. Tell your doctor whether you are pregnant or may be pregnant. Before the test, ask your doctor about changing or stopping your normal medicines. This is important. Ask your doctor whether you can return to your normal activities. You may be asked to drink more fluids. This information is not intended to replace advice given to you by your health care provider. Make sure you discuss any questions you have with your health care provider. Document Revised: 06/20/2018 Document Reviewed: 08/14/2017 Elsevier Patient Education  2021 [place].    Echocardiogram An echocardiogram is a test that uses sound waves (ultrasound) to produce images of the heart. Images from an echocardiogram can provide important information about: Heart size and shape. The size and thickness and movement of your heart's walls. Heart muscle function and strength. Heart valve function or if you have stenosis. Stenosis is when the heart valves are too narrow. If blood is flowing backward through the heart valves (regurgitation). A tumor or infectious growth around the heart valves. Areas of heart muscle that are not working well because of poor blood flow or injury from a heart attack. Aneurysm detection. An aneurysm is a weak or damaged part of an artery wall. The wall bulges out from the normal force of blood pumping through the body. Tell a health care provider about: Any allergies you have. All medicines you are taking, including vitamins, herbs, eye drops, creams, and over-the-counter medicines. Any blood disorders you have. Any surgeries you have had. Any medical conditions you have. Whether you are pregnant or may be pregnant. What are the risks? Generally, this is a safe test. However, problems may occur, including an allergic reaction to dye (contrast) that may be used during the test. What happens before the test? No specific preparation is needed. You may eat and drink normally. What happens  during the test? You will take off your clothes from the waist up and put on a hospital gown. Electrodes or electrocardiogram (ECG)patches may be placed on your chest. The electrodes or patches are then connected to a device that monitors your heart rate and rhythm. You will lie down on a table for an ultrasound exam. A gel will be applied to your chest to help sound waves pass through your skin. A handheld device, called a transducer, will be pressed against your chest and moved over your heart. The transducer produces sound waves that travel to your heart and bounce back (or "echo" back) to the transducer. These sound waves will be captured in real-time and changed into images of your heart that can be viewed on a video monitor. The images will  be recorded on a computer and reviewed by your health care provider. You may be asked to change positions or hold your breath for a short time. This makes it easier to get different views or better views of your heart. In some cases, you may receive contrast through an IV in one of your veins. This can improve the quality of the pictures from your heart. The procedure may vary among health care providers and hospitals.    What can I expect after the test? You may return to your normal, everyday life, including diet, activities, and medicines, unless your health care provider tells you not to do that. Follow these instructions at home: It is up to you to get the results of your test. Ask your health care provider, or the department that is doing the test, when your results will be ready. Keep all follow-up visits. This is important. Summary An echocardiogram is a test that uses sound waves (ultrasound) to produce images of the heart. Images from an echocardiogram can provide important information about the size and shape of your heart, heart muscle function, heart valve function, and other possible heart problems. You do not need to do anything to prepare  before this test. You may eat and drink normally. After the echocardiogram is completed, you may return to your normal, everyday life, unless your health care provider tells you not to do that. This information is not intended to replace advice given to you by your health care provider. Make sure you discuss any questions you have with your health care provider. Document Revised: 10/22/2019 Document Reviewed: 10/22/2019 Elsevier Patient Education  2021 [place].  Rivaroxaban Tablets What is this medication? RIVAROXABAN (ri va ROX a ban) prevents or treats blood clots. It is also used to lower the risk of stroke in people with AFib (atrial fibrillation). It can be used to lower the risk of heart attack or stroke in people with heart or peripheral artery disease. It belongs to a group of medications called blood thinners. This medicine may be used for other purposes; ask your health care provider or pharmacist if you have questions. COMMON BRAND NAME(S): Xarelto, Xarelto Starter Pack What should I tell my care team before I take this medication? They need to know if you have any of these conditions: Antiphospholipid antibody syndrome Bleeding disorders Bleeding in the brain Blood clots Kidney disease Liver disease Prosthetic heart valve Recent or planned spinal or epidural procedure Stomach bleeding Take medications that treat or prevent blood clots An unusual or allergic reaction to rivaroxaban, other medications, foods, dyes, or preservatives Pregnant or trying to get pregnant Breast-feeding How should I use this medication? Take this medication by mouth. For your therapy to work as well as possible, take each dose exactly as prescribed on the prescription label. Do not skip doses. Skipping doses or stopping this medication can increase your risk of a blood clot or stroke. Keep taking this medication unless your care team tells you to stop. If you are taking this medication after hip  or knee replacement surgery, take it with or without food. If you are taking this medication for atrial fibrillation, take it with your evening meal. If you are taking this medication to treat blood clots, take it with food at the same time each day. If you are taking this medication for coronary artery disease or peripheral artery disease, take it with or without food at the same time every day. If you are unable to swallow  your tablet, you may crush the tablet and mix it in applesauce. Then, immediately eat the applesauce. You should eat more food right after you eat the applesauce containing the crushed tablet. A special MedGuide will be given to you by the pharmacist with each prescription and refill. Be sure to read this information carefully each time. Talk to your care team about the use of this medication in children. While it may be prescribed for children as young as newborns for selected conditions, precautions do apply. Overdosage: If you think you have taken too much of this medicine contact a poison control center or emergency room at once. NOTE: This medicine is only for you. Do not share this medicine with others. What if I miss a dose? If you take your medication once a day and miss a dose, take it as soon as you can. If it is almost time for your next dose, take only that dose. Do not take double or extra doses. If you are taking this medication twice a day to treat blood clots and miss a dose, take the missed dose as soon as you remember. In this instance, 2 tablets may be taken at the same time. The next day you should take 1 tablet twice a day. If you are taking this medication twice a day for coronary artery disease or peripheral artery disease and miss a dose, skip it. Take your next dose at the normal time. Do not take extra or 2 doses at the same time to make up for the missed dose. What may interact with this medication? Do not take this medication with any of the  following: Defibrotide This medication may also interact with the following: Aspirin and aspirin-like medications Certain antibiotics like erythromycin and clarithromycin Certain medications for fungal infections like ketoconazole and itraconazole Certain medications for seizures like carbamazepine, phenytoin Certain medications that treat or prevent blood clots like warfarin, enoxaparin, dalteparin, apixaban, dabigatran, and edoxaban Conivaptan Indinavir Lopinavir; ritonavir NSAIDS, medications for pain and inflammation, like ibuprofen or naproxen Rifampin Ritonavir SNRIs, medications for depression, like desvenlafaxine, duloxetine, levomilnacipran, venlafaxine SSRIs, medications for depression, like citalopram, escitalopram, fluoxetine, fluvoxamine, paroxetine, sertraline St. John's wort This list may not describe all possible interactions. Give your health care provider a list of all the medicines, herbs, non-prescription drugs, or dietary supplements you use. Also tell them if you smoke, drink alcohol, or use illegal drugs. Some items may interact with your medicine. What should I watch for while using this medication? Visit your care team for regular checks on your progress. You may need blood work done while you are taking this medication. Your condition will be monitored carefully while you are receiving this medication. It is important not to miss any appointments. Avoid sports and activities that might cause injury while you are using this medication. Severe falls or injuries can cause unseen bleeding. Be careful when using sharp tools or knives. Consider using an Copy. Take special care brushing or flossing your teeth. Report any injuries, bruising, or red spots on the skin to your care team. Wear a medical ID bracelet or chain. Carry a card that describes your condition. List the medications and doses you take on the card. Tell your dentist and dental surgeon that you are  taking this medication. You should not have major dental surgery while on this medication. See your dentist to have a dental exam and fix any dental problems before starting this medication. Take good care of your teeth while on  this medication. Make sure you see your dentist for regular follow-up appointments. If you are going to need surgery or other procedure, tell your care team that you are using this medication. Do not become pregnant while taking this medication. Women should inform their care team if they wish to become pregnant or think they might be pregnant. There is potential for serious harm to an unborn child. Talk to your care team for more information. What side effects may I notice from receiving this medication? Side effects that you should report to your care team as soon as possible: Allergic reactions--skin rash, itching, hives, swelling of the face, lips, tongue, or throat Bleeding--bloody or black, tar-like stools, vomiting blood or brown material that looks like coffee grounds, red or dark brown urine, small red or purple spots on skin, unusual bruising or bleeding Bleeding in the brain--severe headache, stiff neck, confusion, dizziness, change in vision, numbness or weakness of the face, arm, or leg, trouble speaking, trouble walking, vomiting Heavy periods This list may not describe all possible side effects. Call your doctor for medical advice about side effects. You may report side effects to FDA at 1-800-FDA-1088. Where should I keep my medication? Keep out of the reach of children and pets. Store at room temperature between 20 and 25 degrees C (68 and 77 degrees F). Get rid of any unused medication after the expiration date. To get rid of medications that are no longer needed or have expired: Take the medication to a medication take-back program. Check your pharmacy or law enforcement to find a location. If you cannot return the medication, check the label or package  insert to see if the medication should be thrown out in the garbage or flushed down the toilet. If you are not sure, ask your care team. If it is safe to put it in the trash, empty the medication out of the container. Mix the medication with cat litter, dirt, coffee grounds, or other unwanted substance. Seal the mixture in a bag or container. Put it in the trash. NOTE: This sheet is a summary. It may not cover all possible information. If you have questions about this medicine, talk to your doctor, pharmacist, or health care provider.  2023 Elsevier/Gold Standard (2020-04-24 00:00:00)   FDA-cleared personal EKG: The world's most clinically validated personal EKG, FDA-cleared to detect Atrial Fibrillation, Bradycardia, and Tachycardia. Evalee Mutton is the most reliable way to check in on your heart from home. Take your EKG from anywhere: Capture a medical-grade EKG in 30 seconds and get an instant analysis right on your smartphone. Evalee Mutton is small enough to fit in your pocket, so you can take it with you anywhere. Easy to use: Simply place your fingers on the sensors--no wires, patches, or gels. Recommended by doctors: A trusted resource, Evalee Mutton is the #1 doctor-recommended personal EKG with more than 100 million EKGs recorded. Save or share your EKGs: With the press of a button, email your EKGs to your doctor or save them on your phone. Works with smartphones: Compatible with Tour manager and tablets. Check our compatibility chart. FSA/HSA eligible: Purchase using an FSA or HSA account (please confirm coverage with your insurance provider). Phone clip included with purchase, a $15 value. Conveniently take your device with you wherever you go.  https://[email or web address].tn   Step One- Record your EKG strip on [facility] app.   Step two- On Kardia EKG click "Download"   Step three- It will prompt you to make a password  for this EKG. Please make  the password "[email or web address]" so that we can view it.   Step four- Click on the little "upload" button (small box with an arrow in the middle) in the bottom left-hand corner of the screen.   Step five- Click "Save to Files"  Step six- Click on "On my iphone" and then "Pages" then press save in the top right-hand corner.   NOW GO TO MYCHART   Once on MyChart click "Messages"  Step one- Click "Send a message"  Step two- Click "Ask a medical question"   Step three- Click "Non urgent medical question"   Step four- Click on Rajan Revankar's name.  Step five- Click on the small paperclip at the bottom of the screen  Step six- Click "Choose file"  Step seven- Pick the most recent EKG strip listed.   Once uploaded send the message!

## 2021-09-07 ENCOUNTER — Telehealth: Payer: Self-pay | Admitting: *Deleted

## 2021-09-07 NOTE — Telephone Encounter (Signed)
Attempted to leave a message on voicemail in reference to upcoming appointment scheduled for 09/15/21 but no answer or voicemail.Tonya Quinn, [name]

## 2021-09-08 ENCOUNTER — Telehealth: Payer: Self-pay | Admitting: *Deleted

## 2021-09-08 NOTE — Telephone Encounter (Signed)
Patient given detailed instructions per Myocardial Perfusion Study Information Sheet for the test on 09/15/21 at 0815. Patient notified to arrive 15 minutes early and that it is imperative to arrive on time for appointment to keep from having the test rescheduled.  If you need to cancel or reschedule your appointment, please call the office within 24 hours of your appointment. . Patient verbalized understanding.Tonya Quinn, Ranae Palms

## 2021-09-15 ENCOUNTER — Other Ambulatory Visit: Payer: Medicare Other

## 2021-09-15 ENCOUNTER — Ambulatory Visit (INDEPENDENT_AMBULATORY_CARE_PROVIDER_SITE_OTHER): Payer: Medicare Other

## 2021-09-15 DIAGNOSIS — R079 Chest pain, unspecified: Secondary | ICD-10-CM | POA: Diagnosis not present

## 2021-09-15 DIAGNOSIS — I480 Paroxysmal atrial fibrillation: Secondary | ICD-10-CM | POA: Diagnosis not present

## 2021-09-15 MED ORDER — REGADENOSON 0.4 MG/5ML IV SOLN
0.4000 mg | Freq: Once | INTRAVENOUS | Status: AC
  Administered 2021-09-15: 0.4 mg via INTRAVENOUS

## 2021-09-15 MED ORDER — TECHNETIUM TC 99M TETROFOSMIN IV KIT
30.1000 | PACK | Freq: Once | INTRAVENOUS | Status: AC | PRN
  Administered 2021-09-15: 30.1 via INTRAVENOUS

## 2021-09-16 ENCOUNTER — Ambulatory Visit: Payer: Medicare Other

## 2021-09-16 ENCOUNTER — Telehealth: Payer: Self-pay

## 2021-09-16 ENCOUNTER — Other Ambulatory Visit: Payer: Medicare Other

## 2021-09-16 DIAGNOSIS — I4729 Other ventricular tachycardia: Secondary | ICD-10-CM

## 2021-09-16 DIAGNOSIS — I480 Paroxysmal atrial fibrillation: Secondary | ICD-10-CM

## 2021-09-16 LAB — MYOCARDIAL PERFUSION IMAGING
LV dias vol: 74 mL (ref 46–106)
LV sys vol: 22 mL
Nuc Stress EF: 71 %
Peak HR: 95 {beats}/min
Rest HR: 81 {beats}/min
Rest Nuclear Isotope Dose: 31.2 mCi
SDS: 1
SRS: 3
SSS: 3
Stress Nuclear Isotope Dose: 30.1 mCi
TID: 0.91

## 2021-09-16 MED ORDER — APIXABAN 5 MG PO TABS: 5.0000 mg | ORAL_TABLET | Freq: Two times a day (BID) | ORAL | 12 refills | Status: DC

## 2021-09-16 MED ORDER — METOPROLOL SUCCINATE ER 25 MG PO TB24: 12.5000 mg | ORAL_TABLET | Freq: Every day | ORAL | 3 refills | Status: DC

## 2021-09-16 MED ORDER — TECHNETIUM TC 99M TETROFOSMIN IV KIT
31.2000 | PACK | Freq: Once | INTRAVENOUS | Status: AC | PRN
  Administered 2021-09-16: 31.2 via INTRAVENOUS

## 2021-09-16 NOTE — Telephone Encounter (Signed)
-----   Message from Jenean Lindau, MD sent at 09/16/2021 12:39 PM EDT ----- Please get patient in for Chem-7 and mag level.  Abnormal event monitoring.  Medical management.  Toprol-XL 12.5 mg in the morning.  Copy primary care Jenean Lindau, MD 09/16/2021 12:38 PM

## 2021-09-16 NOTE — Telephone Encounter (Signed)
Pt reports that she is not taking her Xarelto due to making her so tired. Discussed with Dr. Geraldo Pitter and changed to Eliquis 5 mg BID. Pt verbalized understanding and had no additional questions. Samples placed for pt to pick up, .

## 2021-09-28 ENCOUNTER — Ambulatory Visit (INDEPENDENT_AMBULATORY_CARE_PROVIDER_SITE_OTHER): Payer: Medicare Other

## 2021-09-28 DIAGNOSIS — R079 Chest pain, unspecified: Secondary | ICD-10-CM

## 2021-09-28 DIAGNOSIS — I480 Paroxysmal atrial fibrillation: Secondary | ICD-10-CM | POA: Diagnosis not present

## 2021-09-28 LAB — ECHOCARDIOGRAM COMPLETE
Area-P 1/2: 2.66 cm2
S' Lateral: 2.9 cm

## 2021-10-24 ENCOUNTER — Encounter (HOSPITAL_COMMUNITY): Payer: Self-pay

## 2021-10-24 ENCOUNTER — Emergency Department (HOSPITAL_BASED_OUTPATIENT_CLINIC_OR_DEPARTMENT_OTHER): Payer: Medicare Other

## 2021-10-24 ENCOUNTER — Encounter (HOSPITAL_BASED_OUTPATIENT_CLINIC_OR_DEPARTMENT_OTHER): Payer: Self-pay | Admitting: Emergency Medicine

## 2021-10-24 ENCOUNTER — Observation Stay (HOSPITAL_BASED_OUTPATIENT_CLINIC_OR_DEPARTMENT_OTHER)
Admission: EM | Admit: 2021-10-24 | Discharge: 2021-10-25 | Disposition: A | Discharge status: Home / Self Care | Payer: Medicare Other | Attending: Internal Medicine | Admitting: Internal Medicine

## 2021-10-24 ENCOUNTER — Other Ambulatory Visit: Payer: Self-pay

## 2021-10-24 DIAGNOSIS — Z7901 Long term (current) use of anticoagulants: Secondary | ICD-10-CM | POA: Diagnosis not present

## 2021-10-24 DIAGNOSIS — Z794 Long term (current) use of insulin: Secondary | ICD-10-CM | POA: Diagnosis not present

## 2021-10-24 DIAGNOSIS — E039 Hypothyroidism, unspecified: Secondary | ICD-10-CM | POA: Insufficient documentation

## 2021-10-24 DIAGNOSIS — I480 Paroxysmal atrial fibrillation: Secondary | ICD-10-CM | POA: Diagnosis not present

## 2021-10-24 DIAGNOSIS — J4540 Moderate persistent asthma, uncomplicated: Secondary | ICD-10-CM

## 2021-10-24 DIAGNOSIS — Z7951 Long term (current) use of inhaled steroids: Secondary | ICD-10-CM | POA: Diagnosis not present

## 2021-10-24 DIAGNOSIS — J40 Bronchitis, not specified as acute or chronic: Secondary | ICD-10-CM | POA: Insufficient documentation

## 2021-10-24 DIAGNOSIS — J45901 Unspecified asthma with (acute) exacerbation: Secondary | ICD-10-CM | POA: Diagnosis present

## 2021-10-24 DIAGNOSIS — Z20822 Contact with and (suspected) exposure to covid-19: Secondary | ICD-10-CM | POA: Insufficient documentation

## 2021-10-24 DIAGNOSIS — R042 Hemoptysis: Secondary | ICD-10-CM

## 2021-10-24 DIAGNOSIS — Z7982 Long term (current) use of aspirin: Secondary | ICD-10-CM | POA: Diagnosis not present

## 2021-10-24 DIAGNOSIS — E1169 Type 2 diabetes mellitus with other specified complication: Secondary | ICD-10-CM | POA: Diagnosis not present

## 2021-10-24 DIAGNOSIS — Z7984 Long term (current) use of oral hypoglycemic drugs: Secondary | ICD-10-CM | POA: Insufficient documentation

## 2021-10-24 DIAGNOSIS — N179 Acute kidney failure, unspecified: Secondary | ICD-10-CM | POA: Diagnosis not present

## 2021-10-24 DIAGNOSIS — Z79899 Other long term (current) drug therapy: Secondary | ICD-10-CM | POA: Insufficient documentation

## 2021-10-24 DIAGNOSIS — J4541 Moderate persistent asthma with (acute) exacerbation: Principal | ICD-10-CM | POA: Insufficient documentation

## 2021-10-24 DIAGNOSIS — I10 Essential (primary) hypertension: Secondary | ICD-10-CM | POA: Diagnosis not present

## 2021-10-24 DIAGNOSIS — Z6841 Body Mass Index (BMI) 40.0 and over, adult: Secondary | ICD-10-CM | POA: Insufficient documentation

## 2021-10-24 HISTORY — DX: Unspecified asthma with (acute) exacerbation: J45.901

## 2021-10-24 LAB — CBC
HCT: 39.9 % (ref 36.0–46.0)
Hemoglobin: 12.4 g/dL (ref 12.0–15.0)
MCH: 27.1 pg (ref 26.0–34.0)
MCHC: 31.1 g/dL (ref 30.0–36.0)
MCV: 87.3 fL (ref 80.0–100.0)
Platelets: 173 10*3/uL (ref 150–400)
RBC: 4.57 MIL/uL (ref 3.87–5.11)
RDW: 15.1 % (ref 11.5–15.5)
WBC: 7.7 10*3/uL (ref 4.0–10.5)
nRBC: 0 % (ref 0.0–0.2)

## 2021-10-24 LAB — TROPONIN I (HIGH SENSITIVITY)
Troponin I (High Sensitivity): 2 ng/L (ref <18)
Troponin I (High Sensitivity): <2 ng/L (ref <18)

## 2021-10-24 LAB — BASIC METABOLIC PANEL
Anion gap: 7 (ref 5–15)
BUN: 23 mg/dL — ABNORMAL HIGH (ref 6–20)
CO2: 26 mmol/L (ref 22–32)
Calcium: 8.6 mg/dL — ABNORMAL LOW (ref 8.9–10.3)
Chloride: 105 mmol/L (ref 98–111)
Creatinine, Ser: 1.09 mg/dL — ABNORMAL HIGH (ref 0.44–1.00)
GFR, Estimated: 60 mL/min — ABNORMAL LOW (ref >60)
Glucose, Bld: 132 mg/dL — ABNORMAL HIGH (ref 70–99)
Potassium: 3.4 mmol/L — ABNORMAL LOW (ref 3.5–5.1)
Sodium: 138 mmol/L (ref 135–145)

## 2021-10-24 LAB — PREGNANCY, URINE: Preg Test, Ur: NEGATIVE

## 2021-10-24 LAB — SARS CORONAVIRUS 2 BY RT PCR: SARS Coronavirus 2 by RT PCR: NEGATIVE

## 2021-10-24 MED ORDER — IOHEXOL 350 MG/ML SOLN
100.0000 mL | Freq: Once | INTRAVENOUS | Status: AC | PRN
  Administered 2021-10-24: 100 mL via INTRAVENOUS

## 2021-10-24 MED ORDER — IPRATROPIUM-ALBUTEROL 0.5-2.5 (3) MG/3ML IN SOLN
3.0000 mL | Freq: Once | RESPIRATORY_TRACT | Status: AC
Start: 2021-10-24 — End: 2021-10-24
  Administered 2021-10-24: 3 mL via RESPIRATORY_TRACT
  Filled 2021-10-24: qty 3

## 2021-10-24 MED ORDER — ALBUTEROL SULFATE (2.5 MG/3ML) 0.083% IN NEBU
2.5000 mg | INHALATION_SOLUTION | Freq: Once | RESPIRATORY_TRACT | Status: AC
  Administered 2021-10-24: 2.5 mg via RESPIRATORY_TRACT
  Filled 2021-10-24: qty 3

## 2021-10-24 MED ORDER — IPRATROPIUM-ALBUTEROL 0.5-2.5 (3) MG/3ML IN SOLN
3.0000 mL | Freq: Once | RESPIRATORY_TRACT | Status: AC
Start: 2021-10-24 — End: 2021-10-24

## 2021-10-24 MED ORDER — ACETAMINOPHEN 325 MG PO TABS
650.0000 mg | ORAL_TABLET | Freq: Once | ORAL | Status: AC
Start: 2021-10-24 — End: 2021-10-24
  Administered 2021-10-24: 650 mg via ORAL
  Filled 2021-10-24: qty 2

## 2021-10-24 MED ORDER — METHYLPREDNISOLONE SODIUM SUCC 125 MG IJ SOLR
125.0000 mg | Freq: Once | INTRAMUSCULAR | Status: AC
  Administered 2021-10-24: 125 mg via INTRAVENOUS
  Filled 2021-10-24: qty 2

## 2021-10-24 MED ORDER — IPRATROPIUM-ALBUTEROL 0.5-2.5 (3) MG/3ML IN SOLN
RESPIRATORY_TRACT | Status: AC
  Administered 2021-10-24: 3 mL via RESPIRATORY_TRACT
  Filled 2021-10-24: qty 3

## 2021-10-24 NOTE — ED Notes (Signed)
Pt up to bathroom via WC

## 2021-10-24 NOTE — ED Provider Notes (Signed)
[facility] EMERGENCY DEPARTMENT Provider Note   CSN: [id number] Arrival date & time: 10/24/21  1617     History  Chief Complaint  Patient presents with   Hemoptysis    Tonya Quinn is a 56 y.o. female.  Patient presenting with a complaint of chest pain cough for a week with shortness of breath started with hemoptysis yesterday.  Patient is on Eliquis for atrial fibrillation.  Patient has had trouble with hemoptysis in the past patient last seen for that in March.  Patient states that mucus with streaks of blood it is not pure blood filling up a cup of blood day.  Patient not normally on any oxygen.  Past medical history is also significant for asthma.  And patient is out of her inhaler.  Past medical history sniffing for hypertension the asthma hyperlipidemia atrial fibrillation sleep apnea hypertension type 2 diabetes obesity past surgical history significant for vaginal hysterectomy.  Patient is non-smoker.       Home Medications Prior to Admission medications   Medication Sig Start Date End Date Taking? Authorizing Provider  albuterol (PROVENTIL HFA;VENTOLIN HFA) 108 (90 Base) MCG/ACT inhaler Inhale 2 puffs into the lungs as needed for wheezing or shortness of breath.    [provider]  apixaban (ELIQUIS) 5 MG TABS tablet Take 1 tablet (5 mg total) by mouth 2 (two) times daily. 09/16/21   Revankar, Reita Cliche, MD  aspirin EC 325 MG tablet Take 325 mg by mouth daily. 06/23/21   [provider]  cetirizine (ZYRTEC) 10 MG tablet Take 10 mg by mouth at bedtime. 08/16/21   [provider]  famotidine (PEPCID) 20 MG tablet Take 20 mg by mouth 2 (two) times daily.    [provider]  Fluticasone-Umeclidin-Vilant 100-62.5-25 MCG/ACT AEPB Inhale 1 puff into the lungs daily.    [provider]  hydrOXYzine (VISTARIL) 25 MG capsule Take 25 mg by mouth every 6 (six) hours as needed for dizziness.    [provider]  JARDIANCE 25  MG TABS tablet Take 25 mg by mouth daily. 04/14/21   [provider]  linaclotide (LINZESS) 72 MCG capsule Take 72 mcg by mouth daily. 11/12/20   [provider]  magnesium gluconate (MAGONATE) 500 MG tablet Take 500 mg by mouth daily.    [provider]  meloxicam (MOBIC) 15 MG tablet Take 15 mg by mouth daily. 07/07/21   [provider]  methocarbamol (ROBAXIN) 500 MG tablet Take 1 tablet (500 mg total) by mouth 2 (two) times daily. 07/03/20   Suzy Bouchard, PA-C  metoprolol succinate (TOPROL XL) 25 MG 24 hr tablet Take 0.5 tablets (12.5 mg total) by mouth daily. 09/16/21   Revankar, Reita Cliche, MD  montelukast (SINGULAIR) 10 MG tablet Take 10 mg by mouth daily. 08/13/21   [provider]  olmesartan (BENICAR) 20 MG tablet Take 20 mg by mouth daily.    [provider]  OZEMPIC, 1 MG/DOSE, 4 MG/3ML SOPN Inject 1 mg into the skin once a week. 04/21/21   [provider]  potassium chloride SA (KLOR-CON M) 20 MEQ tablet Take 20 mEq by mouth every other day.    [provider]  propafenone (RYTHMOL SR) 325 MG 12 hr capsule Take 325 mg by mouth daily. 07/10/21   [provider]  sodium chloride (OCEAN) 0.65 % SOLN nasal spray Place 1 spray into both nostrils as needed for congestion. 05/24/21   Regan Lemming, MD  torsemide [facility])  10 MG tablet Take 10 mg by mouth daily.    [provider]  TRESIBA 100 UNIT/ML SOLN Inject 24 Units into the skin at bedtime. 08/16/21   [provider]      Allergies    Hydrochlorothiazide, Topiramate, Insulin lispro, Fluoxetine, Lisinopril, Sertraline, Insulin glargine, and Metformin and related    Review of Systems   Review of Systems  Constitutional:  Negative for chills and fever.  HENT:  Positive for congestion. Negative for ear pain and sore throat.   Eyes:  Negative for pain and visual disturbance.  Respiratory:  Positive for cough, shortness of breath and wheezing.  Negative for stridor.   Cardiovascular:  Positive for chest pain. Negative for palpitations and leg swelling.  Gastrointestinal:  Negative for abdominal pain and vomiting.  Genitourinary:  Negative for dysuria and hematuria.  Musculoskeletal:  Negative for arthralgias and back pain.  Skin:  Negative for color change and rash.  Neurological:  Negative for seizures and syncope.  All other systems reviewed and are negative.   Physical Exam Updated Vital Signs BP (!) 163/105   Pulse 90   Temp 98.1 F (36.7 C) (Oral)   Resp 18   Ht 1.676 m ('5\' 6"'$ )   Wt 136.1 kg   LMP 02/16/2012   SpO2 95%   BMI 48.42 kg/m  Physical Exam Vitals and nursing note reviewed.  Constitutional:      General: She is not in acute distress.    Appearance: Normal appearance. She is well-developed. She is obese.  HENT:     Head: Normocephalic and atraumatic.     Mouth/Throat:     Mouth: Mucous membranes are moist.     Pharynx: Oropharynx is clear.  Eyes:     Extraocular Movements: Extraocular movements intact.     Conjunctiva/sclera: Conjunctivae normal.     Pupils: Pupils are equal, round, and reactive to light.  Cardiovascular:     Rate and Rhythm: Normal rate and regular rhythm.     Heart sounds: No murmur heard. Pulmonary:     Effort: Pulmonary effort is normal. No respiratory distress.     Breath sounds: No stridor. Wheezing present. No rhonchi or rales.  Abdominal:     Palpations: Abdomen is soft.     Tenderness: There is no abdominal tenderness.  Musculoskeletal:        General: No swelling.     Cervical back: Normal range of motion and neck supple.  Skin:    General: Skin is warm and dry.     Capillary Refill: Capillary refill takes less than 2 seconds.  Neurological:     General: No focal deficit present.     Mental Status: She is alert and oriented to person, place, and time.     Cranial Nerves: No cranial nerve deficit.     Sensory: No sensory deficit.     Motor: No weakness.   Psychiatric:        Mood and Affect: Mood normal.     ED Results / Procedures / Treatments   Labs (all labs ordered are listed, but only abnormal results are displayed) Labs Reviewed  BASIC METABOLIC PANEL - Abnormal; Notable for the following components:      Result Value   Potassium 3.4 (*)    Glucose, Bld 132 (*)    BUN 23 (*)    Creatinine, Ser 1.09 (*)    Calcium 8.6 (*)    GFR, Estimated 60 (*)    All other components  within normal limits  SARS CORONAVIRUS 2 BY RT PCR  CBC  PREGNANCY, URINE  TROPONIN I (HIGH SENSITIVITY)  TROPONIN I (HIGH SENSITIVITY)    EKG EKG Interpretation  Date/Time:  Sunday October 24 2021 16:40:00 EDT Ventricular Rate:  95 PR Interval:  150 QRS Duration: 76 QT Interval:  396 QTC Calculation: 497 R Axis:   -7 Text Interpretation: Sinus rhythm with Premature atrial complexes Minimal voltage criteria for LVH, may be normal variant ( R in aVL ) Possible Anterior infarct , age undetermined Abnormal ECG When compared with ECG of 24-May-2021 09:01, PREVIOUS ECG IS PRESENT Confirmed by Fredia Sorrow [phone]) on 10/24/2021 6:39:21 PM  Radiology CT Angio Chest PE W/Cm &/Or Wo Cm  Result Date: 10/24/2021 CLINICAL DATA:  Pulmonary embolism suspected.  High probability. EXAM: CT ANGIOGRAPHY CHEST WITH CONTRAST TECHNIQUE: Multidetector CT imaging of the chest was performed using the standard protocol during bolus administration of intravenous contrast. Multiplanar CT image reconstructions and MIPs were obtained to evaluate the vascular anatomy. RADIATION DOSE REDUCTION: This exam was performed according to the departmental dose-optimization program which includes automated exposure control, adjustment of the mA and/or kV according to patient size and/or use of iterative reconstruction technique. CONTRAST:  175m OMNIPAQUE IOHEXOL 350 MG/ML SOLN COMPARISON:  PA Lat chest today, chest x-ray 07/22/2021, CTA chest 05/24/2021 and CTA chest 06/10/2016 FINDINGS:  Cardiovascular: Moderate panchamber cardiomegaly with left chamber predominance. Trace calcification left main and LAD coronary artery with no pericardial effusion. The pulmonary veins are normal caliber. Tortuous aorta is within normal caliber limits without significant plaques. The great vessels opacify well with normal variant brachiobicarotid trunk. Pulmonary arteries are normal in caliber without visible arterial embolus. There is suboptimal opacification of apical and basal small arteries in the peripheral lungs. Mediastinum/Nodes: No enlarged mediastinal, hilar, or axillary lymph nodes. Thyroid gland and trachea demonstrate no significant findings. Esophagus is chronically patulous but has a normal wall thickness. Lungs/Pleura: No pleural effusion, thickening or pneumothorax. Interval increased platelike perifissural atelectasis in the posterior lingular base, lateral right middle lobe base. Posterior hazy atelectasis is again seen in the lower lobes. No confluent infiltrate or nodule is seen. Bronchial thickening again noted both lower lobes. The main bronchi are unremarkable. Upper Abdomen: No acute abnormality.  Mild hepatic steatosis. Musculoskeletal: There is thoracic kyphosis and multilevel contiguous bridging enthesopathy. The ribcage is intact. Unremarkable visualized chest wall structures. Review of the MIP images confirms the above findings. IMPRESSION: 1. No evidence of arterial embolism, with suboptimal opacification in the apical and basal peripheral small arteries. 2. Cardiomegaly without findings of acute CHF. 3. Trace calcification left main, LAD coronary artery. No significant aortic plaques. 4. Bronchitis in the lower lobes without evidence of bronchopneumonia. 5. Hepatic steatosis. Electronically Signed   By: KTelford NabM.D.   On: 10/24/2021 20:12   DG Chest 2 View  Result Date: 10/24/2021 CLINICAL DATA:  Cough. EXAM: CHEST - 2 VIEW COMPARISON:  Jul 22, 2021 FINDINGS: The heart size  and mediastinal contours are within normal limits. Both lungs are clear. The visualized skeletal structures are unremarkable. IMPRESSION: The study is limited due to low volumes. No focal infiltrate identified. Electronically Signed   By: DDorise BullionIII M.D.   On: 10/24/2021 16:56    Procedures Procedures    Medications Ordered in ED Medications  ipratropium-albuterol (DUONEB) 0.5-2.5 (3) MG/3ML nebulizer solution 3 mL (3 mLs Nebulization Given 10/24/21 1704)  ipratropium-albuterol (DUONEB) 0.5-2.5 (3) MG/3ML nebulizer solution 3 mL (3 mLs Nebulization Given  10/24/21 1852)  methylPREDNISolone sodium succinate (SOLU-MEDROL) 125 mg/2 mL injection 125 mg (125 mg Intravenous Given 10/24/21 1931)  iohexol (OMNIPAQUE) 350 MG/ML injection 100 mL (100 mLs Intravenous Contrast Given 10/24/21 1956)  albuterol (PROVENTIL) (2.5 MG/3ML) 0.083% nebulizer solution 2.5 mg (2.5 mg Nebulization Given 10/24/21 2012)    ED Course/ Medical Decision Making/ A&P                           Medical Decision Making Amount and/or Complexity of Data Reviewed Labs: ordered. Radiology: ordered.  Risk Prescription drug management. Decision regarding hospitalization.   CRITICAL CARE Performed by: Fredia Sorrow Total critical care time: 45 minutes Critical care time was exclusive of separately billable procedures and treating other patients. Critical care was necessary to treat or prevent imminent or life-threatening deterioration. Critical care was time spent personally by me on the following activities: development of treatment plan with patient and/or surrogate as well as nursing, discussions with consultants, evaluation of patient's response to treatment, examination of patient, obtaining history from patient or surrogate, ordering and performing treatments and interventions, ordering and review of laboratory studies, ordering and review of radiographic studies, pulse oximetry and re-evaluation of patient's  condition.   Patient CT angio no signs of pneumonia no signs of pulmonary embolus.  I think that the hemoptysis secondary to bronchitis.  Patient clearly here with an asthma exacerbation.  And the fact that she is on Eliquis is probably contributing to the hemoptysis which is not a large amount of blood.  Patient's chest pain troponins x2 negative.  Patient's basic metabolic panel glucose 026 potassium down just slightly at 3.4.  Patient's creatinine as at 1.09.  Patient CBC no leukocytosis hemoglobin good at 12.4 platelets good at 173.  Pregnancy test negative. No from the chest pain standpoint and the hemoptysis standpoint patient is stable no evidence of pneumonia or PPE.  And blood counts are good.  Vital signs without any hypotension.  Oxygen sats are in the mid to low 90s.  Patient received Solu-Medrol received 2 DuoNeb treatments and a third albuterol treatment.  Patient is improved some but still wheezing still having a lot of cough and wheezing associated with the cough.  Patient has not coughed up any blood here.    Will contact the hospitalist for admission.   Final Clinical Impression(s) / ED Diagnoses Final diagnoses:  Moderate persistent asthma, unspecified whether complicated  Bronchitis  Hemoptysis    Rx / DC Orders ED Discharge Orders     None         Fredia Sorrow, MD 10/24/21 2054

## 2021-10-24 NOTE — ED Triage Notes (Signed)
Pt arrives pov, steady c/o CP, cough x 1 week, and hemoptysis since yesterday. Pt reports out of inhaler.

## 2021-10-24 NOTE — Progress Notes (Signed)
Transferring facility: [facility] Requesting provider: Dr. Rogene Houston (EDP at [facility]) Reason for transfer: admission for further evaluation and management of acute asthma exacerbation.   56 year old female with medical history notable for paroxysmal atrial fibrillation chronically anticoagulated on Eliquis, moderate persistent asthma, who presented to [facility] ED on 10/24/2021 complaining of shortness of breath over the last 5 days preceded by rhinitis/rhinorrhea.  She also notes associated wheezing, new onset productive cough with intermittent faint, blood tinged yellow sputum.  Denies fever/chills.  Over the last few days she has also developed bilateral anterior chest wall discomfort that occurs with cough.  This is nonexertional.  She also notes that she has run out of her rescue albuterol inhaler over the last few days.  She acknowledges good compliance on Eliquis in the setting of history of paroxysmal atrial fibrillation.  Vital signs in the ED were notable for the following: Oxygen saturation is in the mid to high 90s on room air.  Labs were notable for CBC, which demonstrates no leukocytosis.  Troponin x2 nonelevated.  BMP reflects serum potassium of 3.4.  COVID-19 negative.  Imaging notable for CTA chest, which reportedly shows no acute process, including no evidence of acute pulmonary embolism nor any evidence of infiltrate or edema.  Medications administered prior to transfer included the following: Duo nebulizer treatments x2, albuterol nebulizer x1, solumedrol.  In spite of these interventions, patient is still wheezing and sob.    Subsequently, I accepted this patient for transfer for observation to a med telemetry bed at [facility] or [facility] (first available) for further work-up and management of acute asthma exacerbation.      Check [email or web address] for on-call coverage.   Nursing staff, Please call [place] number on Amion as soon as patient's arrival, so  appropriate admitting provider can evaluate the pt.     Babs Bertin, DO Hospitalist

## 2021-10-24 NOTE — ED Notes (Signed)
ED Provider at bedside. 

## 2021-10-24 NOTE — ED Notes (Signed)
Pt requesting to eat/drink.  Ok to eat/drink per Dr. Rogene Houston.  Pt given graham crackers, peanut butter, and ginger ale per her request.

## 2021-10-25 DIAGNOSIS — J4541 Moderate persistent asthma with (acute) exacerbation: Secondary | ICD-10-CM

## 2021-10-25 DIAGNOSIS — N179 Acute kidney failure, unspecified: Secondary | ICD-10-CM

## 2021-10-25 DIAGNOSIS — I10 Essential (primary) hypertension: Secondary | ICD-10-CM

## 2021-10-25 DIAGNOSIS — E1169 Type 2 diabetes mellitus with other specified complication: Secondary | ICD-10-CM

## 2021-10-25 DIAGNOSIS — I480 Paroxysmal atrial fibrillation: Secondary | ICD-10-CM

## 2021-10-25 DIAGNOSIS — E669 Obesity, unspecified: Secondary | ICD-10-CM

## 2021-10-25 HISTORY — DX: Acute kidney failure, unspecified: N17.9

## 2021-10-25 LAB — BASIC METABOLIC PANEL
Anion gap: 11 (ref 5–15)
BUN: 20 mg/dL (ref 6–20)
CO2: 21 mmol/L — ABNORMAL LOW (ref 22–32)
Calcium: 8.9 mg/dL (ref 8.9–10.3)
Chloride: 107 mmol/L (ref 98–111)
Creatinine, Ser: 0.68 mg/dL (ref 0.44–1.00)
GFR, Estimated: >60 mL/min (ref >60)
Glucose, Bld: 245 mg/dL — ABNORMAL HIGH (ref 70–99)
Potassium: 4 mmol/L (ref 3.5–5.1)
Sodium: 139 mmol/L (ref 135–145)

## 2021-10-25 LAB — GLUCOSE, CAPILLARY: Glucose-Capillary: 205 mg/dL — ABNORMAL HIGH (ref 70–99)

## 2021-10-25 LAB — HEMOGLOBIN A1C
Hgb A1c MFr Bld: 8 % — ABNORMAL HIGH (ref 4.8–5.6)
Mean Plasma Glucose: 182.9 mg/dL

## 2021-10-25 MED ORDER — APIXABAN 5 MG PO TABS
5.0000 mg | ORAL_TABLET | Freq: Two times a day (BID) | ORAL | Status: DC
  Administered 2021-10-25 (×2): 5 mg via ORAL
  Filled 2021-10-25 (×2): qty 1

## 2021-10-25 MED ORDER — FLUTICASONE-UMECLIDIN-VILANT 100-62.5-25 MCG/ACT IN AEPB
1.0000 | INHALATION_SPRAY | Freq: Every day | RESPIRATORY_TRACT | 0 refills | Status: AC
Start: 2021-10-25 — End: 2021-11-24

## 2021-10-25 MED ORDER — ORAL CARE MOUTH RINSE: 15.0000 mL | OROMUCOSAL | Status: DC | PRN

## 2021-10-25 MED ORDER — ACETAMINOPHEN 325 MG PO TABS: 650.0000 mg | ORAL_TABLET | Freq: Four times a day (QID) | ORAL | Status: DC | PRN

## 2021-10-25 MED ORDER — INSULIN ASPART 100 UNIT/ML IJ SOLN: 0.0000 [IU] | Freq: Three times a day (TID) | INTRAMUSCULAR | Status: DC

## 2021-10-25 MED ORDER — LACTATED RINGERS IV BOLUS
500.0000 mL | Freq: Once | INTRAVENOUS | Status: AC
Start: 2021-10-25 — End: 2021-10-25
  Administered 2021-10-25: 500 mL via INTRAVENOUS

## 2021-10-25 MED ORDER — ALBUTEROL SULFATE (2.5 MG/3ML) 0.083% IN NEBU
2.5000 mg | INHALATION_SOLUTION | Freq: Three times a day (TID) | RESPIRATORY_TRACT | Status: DC
  Administered 2021-10-25: 2.5 mg via RESPIRATORY_TRACT
  Filled 2021-10-25: qty 3

## 2021-10-25 MED ORDER — PROPAFENONE HCL ER 325 MG PO CP12
325.0000 mg | ORAL_CAPSULE | Freq: Two times a day (BID) | ORAL | Status: DC
  Filled 2021-10-25: qty 1

## 2021-10-25 MED ORDER — PREDNISONE 10 MG PO TABS: ORAL_TABLET | ORAL | 0 refills | Status: DC

## 2021-10-25 MED ORDER — ALBUTEROL SULFATE (2.5 MG/3ML) 0.083% IN NEBU
2.5000 mg | INHALATION_SOLUTION | Freq: Four times a day (QID) | RESPIRATORY_TRACT | Status: DC
  Filled 2021-10-25: qty 3

## 2021-10-25 MED ORDER — ALBUTEROL SULFATE HFA 108 (90 BASE) MCG/ACT IN AERS: 2.0000 | INHALATION_SPRAY | RESPIRATORY_TRACT | 0 refills | Status: DC | PRN

## 2021-10-25 MED ORDER — HYDROXYZINE HCL 25 MG PO TABS
25.0000 mg | ORAL_TABLET | Freq: Four times a day (QID) | ORAL | Status: DC | PRN
  Administered 2021-10-25: 25 mg via ORAL
  Filled 2021-10-25: qty 1

## 2021-10-25 MED ORDER — MONTELUKAST SODIUM 10 MG PO TABS: 10.0000 mg | ORAL_TABLET | Freq: Every day | ORAL | 0 refills | Status: DC

## 2021-10-25 MED ORDER — MAGNESIUM GLUCONATE 500 MG PO TABS
500.0000 mg | ORAL_TABLET | Freq: Every day | ORAL | Status: DC
  Administered 2021-10-25: 500 mg via ORAL
  Filled 2021-10-25: qty 1

## 2021-10-25 MED ORDER — LORATADINE 10 MG PO TABS
10.0000 mg | ORAL_TABLET | Freq: Every day | ORAL | Status: DC
  Administered 2021-10-25: 10 mg via ORAL
  Filled 2021-10-25: qty 1

## 2021-10-25 MED ORDER — GUAIFENESIN 100 MG/5ML PO LIQD
5.0000 mL | ORAL | Status: DC | PRN
  Administered 2021-10-25: 5 mL via ORAL
  Filled 2021-10-25: qty 10

## 2021-10-25 MED ORDER — LINACLOTIDE 72 MCG PO CAPS: 72.0000 ug | ORAL_CAPSULE | Freq: Every day | ORAL | Status: DC

## 2021-10-25 MED ORDER — EMPAGLIFLOZIN 10 MG PO TABS
10.0000 mg | ORAL_TABLET | Freq: Every day | ORAL | Status: DC
Start: 2021-10-25 — End: 2021-10-25
  Administered 2021-10-25: 10 mg via ORAL
  Filled 2021-10-25: qty 1

## 2021-10-25 MED ORDER — METOPROLOL SUCCINATE ER 25 MG PO TB24
12.5000 mg | ORAL_TABLET | Freq: Every day | ORAL | Status: DC
  Administered 2021-10-25: 12.5 mg via ORAL
  Filled 2021-10-25: qty 1

## 2021-10-25 MED ORDER — ALBUTEROL SULFATE (2.5 MG/3ML) 0.083% IN NEBU
2.5000 mg | INHALATION_SOLUTION | RESPIRATORY_TRACT | Status: DC | PRN
Start: 2021-10-25 — End: 2021-10-25
  Administered 2021-10-25: 2.5 mg via RESPIRATORY_TRACT
  Filled 2021-10-25: qty 3

## 2021-10-25 MED ORDER — MAGNESIUM SULFATE IN D5W 1-5 GM/100ML-% IV SOLN
1.0000 g | Freq: Once | INTRAVENOUS | Status: AC
  Administered 2021-10-25: 1 g via INTRAVENOUS
  Filled 2021-10-25 (×2): qty 100

## 2021-10-25 MED ORDER — METHYLPREDNISOLONE SODIUM SUCC 125 MG IJ SOLR: 60.0000 mg | INTRAMUSCULAR | Status: DC

## 2021-10-25 NOTE — Assessment & Plan Note (Signed)
On Jardiance, ozempic, 30qHS Tresiba at home.  -CBG of 132 on presentation. Start moderate SSI ACHS for now

## 2021-10-25 NOTE — Assessment & Plan Note (Signed)
BMI 42 

## 2021-10-25 NOTE — H&P (Signed)
History and Physical    Patient: Tonya Quinn HYW:[id number] DOB: 02-02-1966 DOA: 10/24/2021 DOS: the patient was seen and examined on 10/25/2021 PCP: Cher Nakai, MD  Patient coming from: Home transfer from [facility] ED  Chief Complaint:  Chief Complaint  Patient presents with   Hemoptysis   HPI: Tonya Quinn is a 56 y.o. female with medical history significant of Paroxysmal atrial fibrillation on Eliquis, moderate persistent asthma, hypertension, hypothyroidism who presents with increasing shortness of breath and hemoptysis.  She had URI symptoms around 9 days ago with coughing and runny nose.  Has progressively worsening increasing shortness of breath.  Ran out of her albuterol inhaler about 3 days ago.  No history of tobacco use but has people to smoke around her home.  Today noticed blood-tinged sputum prompting her to present to the ED.  She also has chest pain worse with coughing.  No nausea, vomiting or diarrhea.  In the ED, she was afebrile, hypertensive 135/103 with documented 8 L although patient states that she was never placed on oxygen in the ED. She was given DuoNeb, albuterol nebulizer, IV 125 mg Solu-Medrol.  No leukocytosis or anemia.  AKI with creatinine 1.09 up from 0.78. CTA chest negative for PE but with with signs of bronchitis. COVID PCR negative  Review of Systems: As mentioned in the history of present illness. All other systems reviewed and are negative. Past Medical History:  Diagnosis Date   Adult BMI 45.0-49.9 kg/sq m [facility])    ALLERGIC RHINITIS 07/05/2006   Qualifier: Diagnosis of  By: Milinda Antis  MD, Adlih     Anxiety 04/07/2015   Asthma    Atrial fibrillation ([place])    Atrial flutter ([place]) 01/02/2015   BACK PAIN 07/05/2006   Qualifier: Diagnosis of  By: Milinda Antis  MD, Adlih     Benign essential hypertension    Bronchitis 11/24/2014   Cervical spine pain 07/11/2019   Chronic midline thoracic back pain 07/11/2019   Common cold 11/04/2014    Constipation    Constipation by delayed colonic transit 09/10/2014   Current use of insulin ([place]) 10/16/2018   DDD (degenerative disc disease), lumbar    Degeneration of intervertebral disc of lumbar region 07/13/2015   Degenerative disc disease, 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   Diabetes mellitus without complication ([place]) 4/85/4627   Diabetic neuropathy associated with type 2 diabetes mellitus ([place]) 09/12/2017   Diabetic polyneuropathy associated with type 2 diabetes mellitus ([place]) 10/23/2018   Dyspnea, unspecified 01/02/2015   Dysuria 07/05/2006   Qualifier: Diagnosis of  By: Dalbert [place]     Elevated lactic acid level 01/02/2015   Essential hypertension    Excessive sleepiness 11/12/2013   Fibroids 03/01/2012   Fibromyalgia 11/12/2013   Generalized anxiety disorder 09/19/2014   Generalized osteoarthritis of multiple sites    GERD (gastroesophageal reflux disease)    Heart palpitations 01/02/2015   HLD (hyperlipidemia)    Hot flash, menopausal 09/24/2015   Formatting of this note might be different from the original. Pt with multiple complaints that have been ongoing since prior to her hyst.  LAVH/BSO in 2013 at [facility].  C/o bloating, breast tenderness, mood changes, pelvic pain. Check labs as below to eval for menopause vs ? Ovarian remnant.  Imaging reviewed as far back as 2015. Benign.  RTC 4wks for follow up. Again discussed that her symptoms a   Hyperglycemia 01/02/2015   Hyperlipidemia associated with type 2 diabetes mellitus ([place]) 12/27/2018   Hyperopia of both eyes with astigmatism and presbyopia 10/16/2018  Hypertension    Hypertension associated with type 2 diabetes mellitus ([place]) 07/13/2015   Hypokalemia    Hypomagnesemia    Hypothyroidism    Left-sided weakness 04/07/2015   Long term current use of oral hypoglycemic drug 10/16/2018   LOW BACK PAIN, CHRONIC 07/05/2006   Qualifier: Diagnosis of  By: Milinda Antis  MD, Adlih     Lower extremity edema    Major depressive disorder, recurrent episode,  moderate ([place]) 11/27/2012   Microalbuminuria due to type 2 diabetes mellitus ([place]) 07/14/2018   Morbid obesity ([place])    Nuclear sclerotic cataract of both eyes 10/16/2018   Obesity    PMS (premenstrual syndrome) 12/28/2017   Polyarthralgia 08/05/2021   Postmenopausal bleeding 12/28/2017   Sepsis ([place]) 01/02/2015   Sleep apnea    Status post hysterectomy 09/30/2019   SVT (supraventricular tachycardia) ([place]) 01/02/2015   Type 2 diabetes mellitus with diabetic neuropathy, with long-term current use of insulin ([place]) 12/27/2018   Type 2 diabetes mellitus with obesity ([place])    Uncontrolled type 2 diabetes mellitus with hyperglycemia ([place]) 04/07/2015   Formatting of this note might be different from the original. Uncontrolled. A1c 7/17- 10.9   Uninsured medical expenses 09/10/2014   Vaginal dryness, menopausal 07/13/2015   Formatting of this note might be different from the original. Some atrophy present. She desires oral estrogen, but will not rx given her multiple comorbidities. Start vaginal premarin RTC 3 months for f/u.   Vitreous floater, bilateral 10/16/2018   Weakness 04/08/2015   Past Surgical History:  Procedure Laterality Date   ABDOMINAL HYSTERECTOMY     CYSTOSCOPY  02/29/2012   Procedure: CYSTOSCOPY;  Surgeon: Cheri Fowler, MD;  Location: [facility];  Service: Gynecology;  Laterality: N/A;   LAPAROSCOPIC ASSISTED VAGINAL HYSTERECTOMY  02/29/2012   Procedure: LAPAROSCOPIC ASSISTED VAGINAL HYSTERECTOMY;  Surgeon: Cheri Fowler, MD;  Location: [facility];  Service: Gynecology;  Laterality: N/A;   SALPINGOOPHORECTOMY  02/29/2012   Procedure: SALPINGO OOPHORECTOMY;  Surgeon: Cheri Fowler, MD;  Location: [facility];  Service: Gynecology;  Laterality: Bilateral;   uterine ablation     VEIN SURGERY     Social History:  reports that she has never smoked. She has never used smokeless tobacco. She reports that she does not drink alcohol and does not use drugs.  Allergies  Allergen Reactions    Hydrochlorothiazide Swelling    Leg cramps and pain in temples Hypokalemia - cramps    Topiramate Shortness Of Breath   Insulin Lispro     Pt reports Humalog 75/25 makes her constipated.     Fluoxetine     nightmares   Lisinopril     Patient reports headache    Sertraline     Green stool    Insulin Glargine Rash    Hives and Rash   Metformin And Related     Abdominal Pain    Family History  Problem Relation Age of Onset   Hypertension Brother    Diabetes Brother    Diabetes Maternal Grandmother    Hypertension Maternal Grandmother    Heart disease Maternal Grandmother    Diabetes Paternal Grandmother    Hypertension Paternal Grandmother     Prior to Admission medications   Medication Sig Start Date End Date Taking? Authorizing Provider  albuterol (PROVENTIL HFA;VENTOLIN HFA) 108 (90 Base) MCG/ACT inhaler Inhale 2 puffs into the lungs as needed for wheezing or shortness of breath.    [provider]  apixaban (ELIQUIS) 5 MG TABS tablet Take 1 tablet (  5 mg total) by mouth 2 (two) times daily. 09/16/21   Revankar, Reita Cliche, MD  aspirin EC 325 MG tablet Take 325 mg by mouth daily. 06/23/21   [provider]  cetirizine (ZYRTEC) 10 MG tablet Take 10 mg by mouth at bedtime. 08/16/21   [provider]  famotidine (PEPCID) 20 MG tablet Take 20 mg by mouth 2 (two) times daily.    [provider]  Fluticasone-Umeclidin-Vilant 100-62.5-25 MCG/ACT AEPB Inhale 1 puff into the lungs daily.    [provider]  hydrOXYzine (VISTARIL) 25 MG capsule Take 25 mg by mouth every 6 (six) hours as needed for dizziness.    [provider]  JARDIANCE 25 MG TABS tablet Take 25 mg by mouth daily. 04/14/21   [provider]  linaclotide (LINZESS) 72 MCG capsule Take 72 mcg by mouth daily. 11/12/20   [provider]  magnesium gluconate (MAGONATE) 500 MG tablet Take 500 mg by mouth daily.    [provider]  meloxicam (MOBIC) 15 MG  tablet Take 15 mg by mouth daily. 07/07/21   [provider]  methocarbamol (ROBAXIN) 500 MG tablet Take 1 tablet (500 mg total) by mouth 2 (two) times daily. 07/03/20   Suzy Bouchard, PA-C  metoprolol succinate (TOPROL XL) 25 MG 24 hr tablet Take 0.5 tablets (12.5 mg total) by mouth daily. 09/16/21   Revankar, Reita Cliche, MD  montelukast (SINGULAIR) 10 MG tablet Take 10 mg by mouth daily. 08/13/21   [provider]  olmesartan (BENICAR) 20 MG tablet Take 20 mg by mouth daily.    [provider]  OZEMPIC, 1 MG/DOSE, 4 MG/3ML SOPN Inject 1 mg into the skin once a week. 04/21/21   [provider]  potassium chloride SA (KLOR-CON M) 20 MEQ tablet Take 20 mEq by mouth every other day.    [provider]  propafenone (RYTHMOL SR) 325 MG 12 hr capsule Take 325 mg by mouth daily. 07/10/21   [provider]  sodium chloride (OCEAN) 0.65 % SOLN nasal spray Place 1 spray into both nostrils as needed for congestion. 05/24/21   Regan Lemming, MD  torsemide (DEMADEX) 10 MG tablet Take 10 mg by mouth daily.    [provider]  TRESIBA 100 UNIT/ML SOLN Inject 24 Units into the skin at bedtime. 08/16/21   [provider]    Physical Exam: Vitals:   10/24/21 2013 10/24/21 2015 10/24/21 2116 10/24/21 2357  BP:  (!) 163/105 (!) 185/108 134/80  Pulse:  90 92 94  Resp:  '18 18 20  '$ Temp:   98.1 F (36.7 C) 98 F (36.7 C)  TempSrc:   Oral   SpO2: 100% 95% 100% 95%  Weight:      Height:       Constitutional: NAD, morbidly obese female laying upright in bed with increasing tachypnea during conversation Eyes:Llids and conjunctivae normal ENMT: Mucous membranes are moist.  Neck: normal, supple Respiratory: Diffuse expiratory wheezes throughout.  Has tachypnea and labored respiration when speaking.  No accessory muscle use.  On room air. Cardiovascular: Regular rate and rhythm, no murmurs / rubs / gallops. No extremity edema. Abdomen: no tenderness,  Bowel sounds positive.  Musculoskeletal: no clubbing / cyanosis. No joint deformity upper and lower extremities. Good ROM, no contractures. Normal muscle tone.  Skin: no rashes, lesions, ulcers. No induration Neurologic: CN 2-12 grossly intact.  Strength 5/5 in all 4.  Psychiatric: Normal judgment and insight. Alert and oriented x 3.  Normal mood. Data Reviewed:  See HPI  Assessment and Plan: * Acute asthma exacerbation Likely triggered by recent viral URI.  COVID PCR negative. -CTA chest negative for PE, with signs of bronchitis but no bronchopneumonia -Give IV magnesium x1 -Albuterol nebulizer scheduled Q6hr for 24 hrs -IV solu medrol daily -keep on continuous pulse ox  AKI (acute kidney injury) (HCC) creatinine 1.09 up from 0.78. -Hold ARB and Torsemide -Give 500cc LR bolus and follow creatinine in the morning -avoid nephrotoxic agent   PAF (paroxysmal atrial fibrillation) (HCC) Hx of SVT -Continue propafenone and metoprolol -continue Eliquis  Type 2 diabetes mellitus with obesity ([place]) On Jardiance, ozempic, 30qHS Tresiba at home.  -CBG of 132 on presentation. Start moderate SSI ACHS for now  Morbid obesity ([place]) BMI 42  Essential hypertension Continue metoprolol -hold ARB and Torsemide due to AKI      Advance Care Planning:   Code Status: Full Code full  Consults: None  Family Communication: None at bedside  Severity of Illness: The appropriate patient status for this patient is OBSERVATION. Observation status is judged to be reasonable and necessary in order to provide the required intensity of service to ensure the patient's safety. The patient's presenting symptoms, physical exam findings, and initial radiographic and laboratory data in the context of their medical condition is felt to place them at decreased risk for further clinical deterioration. Furthermore, it is anticipated that the patient will be medically stable for discharge from the hospital within  2 midnights of admission.   Author: Orene Desanctis, DO 10/25/2021 12:42 AM  For on call review [email or web address].

## 2021-10-25 NOTE — Assessment & Plan Note (Addendum)
Likely triggered by recent viral URI.  COVID PCR negative. -CTA chest negative for PE, with signs of bronchitis but no bronchopneumonia -Give IV magnesium x1 -Albuterol nebulizer scheduled Q6hr for 24 hrs -IV solu medrol daily -keep on continuous pulse ox

## 2021-10-25 NOTE — Assessment & Plan Note (Signed)
Hx of SVT -Continue propafenone and metoprolol -continue Eliquis

## 2021-10-25 NOTE — Assessment & Plan Note (Addendum)
creatinine 1.09 up from 0.78. -Hold ARB and Torsemide -Give 500cc LR bolus and follow creatinine in the morning -avoid nephrotoxic agent

## 2021-10-25 NOTE — Progress Notes (Signed)
  Transition of Care [facility]) Screening Note   Patient Details  Name: Tonya Quinn Date of Birth: November 05, 1965   [facility]) CM/SW Contact:    Vassie Moselle, LCSW Phone Number: 10/25/2021, 10:28 AM    Transition of Care Department [facility]) has reviewed patient and no TOC needs have been identified at this time. We will continue to monitor patient advancement through interdisciplinary progression rounds. If new patient transition needs arise, please place a TOC consult.

## 2021-10-25 NOTE — Discharge Summary (Signed)
Physician Discharge Summary  Tonya Quinn EXH:[id number] DOB: 1965-05-05 DOA: 10/24/2021  PCP: Cher Nakai, MD  Admit date: 10/24/2021 Discharge date: 10/25/2021 Recommendations for Outpatient Follow-up:  Follow up with PCP in 1 weeks-call for appointment Please obtain BMP/CBC in one week  Discharge Dispo: home Discharge Condition: Stable Code Status:   Code Status: Full Code Diet recommendation:  Diet Order             Diet Heart Room service appropriate? Yes; Fluid consistency: Thin  Diet effective now                    Brief/Interim Summary: 56 y.o.f w/ history significant of Paroxysmal atrial fibrillation on Eliquis, moderate persistent asthma, hypertension, hypothyroidism who presented with increasing shortness of breath and hemoptysis, symptoms onset 9 days PTA with cough, runny nose with progressively worsening increasing shortness of breath.Ran out of her albuterol inhaler about 3 days  PTA. She had blood-tinged sputum prompting her to present to the ED. She also has chest pain worse with coughing. In the ED, she was afebrile, hypertensive 135/103 with documented 8 L although patient states that she was never placed on oxygen in the ED. She was given DuoNeb, albuterol nebulizer, IV 125 mg Solu-Medrol. No leukocytosis or anemia.  AKI with creatinine 1.09 up from 0.78. CTA chest negative for PE but with with signs of bronchitis.COVID PCR negative.  Patient admitted for acute asthma exacerbation, AKI. Respiratory status has improved.  Overall doing much better.this morning she has been ambulating well without tachypnea hypoxia or shortness of breath, and feels well enough to go home.  Discharge home on steroid taper and inhalers.   Discharge Diagnoses:  Principal Problem:   Acute asthma exacerbation Active Problems:   Essential hypertension   Morbid obesity ([place])   Type 2 diabetes mellitus with obesity (HCC)   PAF (paroxysmal atrial fibrillation) (HCC)   AKI (acute kidney  injury) ([place])  Acute asthma exacerbation: Likely triggered by recent viral URI, COVID-19 negative, no PE,or pulmonary finding on CTA.S/P mag sulfate IV, treated w/ aggressive bronchodilator, IV Solu-Medrol, IS> this morning she has been ambulating well without tachypnea hypoxia or shortness of breath, and feels well enough to go home.  Discharge home on steroid taper and inhalers.  Essential hypertension: BP stable,continue metoprolol Morbid obesity W/ bmi 48: Will benefit with PCP follow-up T2DM with obesity with uncontrolled hyperglycemia continue SSI 0-15U, continue Linzess,  PAF: Rate controlled continue metoprolol 12.5 twice daily, propafenone, Eliquis AKI: Resolved   Consults: none Subjective: Alert awake oriented on room air with no wheezing cough this morning. Feels well enough to go home.  Discharge Exam: Vitals:   10/25/21 0735 10/25/21 0840  BP: (!) 152/86   Pulse: 91   Resp: 18   Temp: 98.1 F (36.7 C)   SpO2: 95% 97%   General: Pt is alert, awake, not in acute distress Cardiovascular: RRR, S1/S2 +, no rubs, no gallops Respiratory: CTA bilaterally, no wheezing, no rhonchi Abdominal: Soft, NT, ND, bowel sounds + Extremities: no edema, no cyanosis  Discharge Instructions  Discharge Instructions     Discharge instructions   Complete by: As directed    Please call call MD or return to ER for similar or worsening recurring problem that brought you to hospital or if any fever,nausea/vomiting,abdominal pain, uncontrolled pain, chest pain,  shortness of breath or any other alarming symptoms.  Please follow-up your doctor -primary care doctor in a week time and call the office for appointment.  Please avoid alcohol, smoking, or any other illicit substance and maintain healthy habits including taking your regular medications as prescribed.  You were cared for by a hospitalist during your hospital stay. If you have any questions about your discharge medications or the  care you received while you were in the hospital after you are discharged, you can call the unit and ask to speak with the hospitalist on call if the hospitalist that took care of you is not available.  Once you are discharged, your primary care physician will handle any further medical issues. Please note that NO REFILLS for any discharge medications will be authorized once you are discharged, as it is imperative that you return to your primary care physician (or establish a relationship with a primary care physician if you do not have one) for your aftercare needs so that they can reassess your need for medications and monitor your lab values   Increase activity slowly   Complete by: As directed       Allergies as of 10/25/2021       Reactions   Hydrochlorothiazide Swelling   Leg cramps and pain in temples Hypokalemia - cramps   Topiramate Shortness Of Breath, Other (See Comments)   Constipation   Insulin Lispro Itching, Other (See Comments)   Pt reports Humalog 75/25 makes her constipated. Welts   Fluoxetine Other (See Comments)   nightmares   Lisinopril Other (See Comments)   Patient reports headache    Sertraline Other (See Comments)   Green/blue stool   Insulin Glargine Hives, Rash   Metformin And Related Diarrhea, Other (See Comments)   Abdominal Pain        Medication List     STOP taking these medications    aspirin EC 325 MG tablet   methocarbamol 500 MG tablet Commonly known as: ROBAXIN   potassium chloride SA 20 MEQ tablet Commonly known as: KLOR-CON M       TAKE these medications    acetaminophen 500 MG tablet Commonly known as: TYLENOL Take 500 mg by mouth daily as needed (pain).   albuterol 108 (90 Base) MCG/ACT inhaler Commonly known as: VENTOLIN HFA Inhale 2 puffs into the lungs as needed for wheezing or shortness of breath.   apixaban 5 MG Tabs tablet Commonly known as: ELIQUIS Take 1 tablet (5 mg total) by mouth 2 (two) times daily. What  changed: when to take this   cetirizine 10 MG tablet Commonly known as: ZYRTEC Take 10 mg by mouth daily as needed for allergies.   famotidine 20 MG tablet Commonly known as: PEPCID Take 20 mg by mouth 2 (two) times daily.   Fluticasone-Umeclidin-Vilant 100-62.5-25 MCG/ACT Aepb Inhale 1 puff into the lungs daily.   hydrOXYzine 25 MG capsule Commonly known as: VISTARIL Take 25 mg by mouth as needed for anxiety.   Jardiance 25 MG Tabs tablet Generic drug: empagliflozin Take 25 mg by mouth daily.   Linzess 72 MCG capsule Generic drug: linaclotide Take 72 mcg by mouth daily as needed (constipation).   magnesium gluconate 500 MG tablet Commonly known as: MAGONATE Take 500 mg by mouth daily.   meloxicam 15 MG tablet Commonly known as: MOBIC Take 15 mg by mouth as needed for pain.   metoprolol succinate 25 MG 24 hr tablet Commonly known as: Toprol XL Take 0.5 tablets (12.5 mg total) by mouth daily.   MIRALAX PO Take 17 g by mouth 2 (two) times daily as needed (constipation).   montelukast 10 MG  tablet Commonly known as: SINGULAIR Take 1 tablet (10 mg total) by mouth daily.   olmesartan 20 MG tablet Commonly known as: BENICAR Take 20 mg by mouth daily.   Ozempic (1 MG/DOSE) 4 MG/3ML Sopn Generic drug: Semaglutide (1 MG/DOSE) Inject 1 mg into the skin once a week.   pantoprazole 40 MG tablet Commonly known as: PROTONIX Take 40 mg by mouth daily as needed (acid reflux).   predniSONE 10 MG tablet Commonly known as: DELTASONE Take PO 4 tabs daily x 2 days,3 tabs daily x 2 days,2 tabs daily x 2 days,1 tab daily x 2 days then stop.   propafenone 325 MG 12 hr capsule Commonly known as: RYTHMOL SR Take 325 mg by mouth 2 (two) times daily.   sodium chloride 0.65 % Soln nasal spray Commonly known as: OCEAN Place 1 spray into both nostrils as needed for congestion.   torsemide 10 MG tablet Commonly known as: DEMADEX Take 10 mg by mouth daily.   Tresiba 100 UNIT/ML  Soln Generic drug: Insulin Degludec Inject 30 Units into the skin at bedtime.   VITAMIN B-12 PO Take 1 capsule by mouth 3 (three) times a week.   VITAMIN D3 PO Take 1 capsule by mouth 3 (three) times a week.   Voltaren 1 % Gel Generic drug: diclofenac Sodium Apply 1 Application topically as needed (pain).        Follow-up Information     Cher Nakai, MD Follow up in 1 week(s).   Specialty: Internal Medicine Contact information: [address] [place] [address] [phone]                Allergies  Allergen Reactions   Hydrochlorothiazide Swelling    Leg cramps and pain in temples Hypokalemia - cramps    Topiramate Shortness Of Breath and Other (See Comments)    Constipation   Insulin Lispro Itching and Other (See Comments)    Pt reports Humalog 75/25 makes her constipated. Welts    Fluoxetine Other (See Comments)    nightmares   Lisinopril Other (See Comments)    Patient reports headache    Sertraline Other (See Comments)    Green/blue stool    Insulin Glargine Hives and Rash   Metformin And Related Diarrhea and Other (See Comments)    Abdominal Pain    The results of significant diagnostics from this hospitalization (including imaging, microbiology, ancillary and laboratory) are listed below for reference.    Microbiology: Recent Results (from the past 240 hour(s))  SARS Coronavirus 2 by RT PCR (hospital order, performed in [facility] hospital lab) *cepheid single result test* Anterior Nasal Swab     Status: None   Collection Time: 10/24/21  6:41 PM   Specimen: Anterior Nasal Swab  Result Value Ref Range Status   SARS Coronavirus 2 by RT PCR NEGATIVE NEGATIVE Final    Comment: (NOTE) SARS-CoV-2 target nucleic acids are NOT DETECTED.  The SARS-CoV-2 RNA is generally detectable in upper and lower respiratory specimens during the acute phase of infection. The lowest concentration of SARS-CoV-2 viral copies this assay can detect is  250 copies / mL. A negative result does not preclude SARS-CoV-2 infection and should not be used as the sole basis for treatment or other patient management decisions.  A negative result may occur with improper specimen collection / handling, submission of specimen other than nasopharyngeal swab, presence of viral mutation(s) within the areas targeted by this assay, and inadequate number of viral copies (<  250 copies / mL). A negative result must be combined with clinical observations, patient history, and epidemiological information.  Fact Sheet for Patients:   https://www.patel.info/  Fact Sheet for Healthcare Providers: https://hall.com/  This test is not yet approved or  cleared by the [place] FDA and has been authorized for detection and/or diagnosis of SARS-CoV-2 by FDA under an Emergency Use Authorization (EUA).  This EUA will remain in effect (meaning this test can be used) for the duration of the COVID-19 declaration under Section 564(b)(1) of the Act, 21 U.S.C. section 360bbb-3(b)(1), unless the authorization is terminated or revoked sooner.  Performed at [facility], [address], [place], [place] [address]     Procedures/Studies: CT Angio Chest PE W/Cm &/Or Wo Cm  Result Date: 10/24/2021 CLINICAL DATA:  Pulmonary embolism suspected.  High probability. EXAM: CT ANGIOGRAPHY CHEST WITH CONTRAST TECHNIQUE: Multidetector CT imaging of the chest was performed using the standard protocol during bolus administration of intravenous contrast. Multiplanar CT image reconstructions and MIPs were obtained to evaluate the vascular anatomy. RADIATION DOSE REDUCTION: This exam was performed according to the departmental dose-optimization program which includes automated exposure control, adjustment of the mA and/or kV according to patient size and/or use of iterative reconstruction technique. CONTRAST:  166m OMNIPAQUE IOHEXOL 350  MG/ML SOLN COMPARISON:  PA Lat chest today, chest x-ray 07/22/2021, CTA chest 05/24/2021 and CTA chest 06/10/2016 FINDINGS: Cardiovascular: Moderate panchamber cardiomegaly with left chamber predominance. Trace calcification left main and LAD coronary artery with no pericardial effusion. The pulmonary veins are normal caliber. Tortuous aorta is within normal caliber limits without significant plaques. The great vessels opacify well with normal variant brachiobicarotid trunk. Pulmonary arteries are normal in caliber without visible arterial embolus. There is suboptimal opacification of apical and basal small arteries in the peripheral lungs. Mediastinum/Nodes: No enlarged mediastinal, hilar, or axillary lymph nodes. Thyroid gland and trachea demonstrate no significant findings. Esophagus is chronically patulous but has a normal wall thickness. Lungs/Pleura: No pleural effusion, thickening or pneumothorax. Interval increased platelike perifissural atelectasis in the posterior lingular base, lateral right middle lobe base. Posterior hazy atelectasis is again seen in the lower lobes. No confluent infiltrate or nodule is seen. Bronchial thickening again noted both lower lobes. The main bronchi are unremarkable. Upper Abdomen: No acute abnormality.  Mild hepatic steatosis. Musculoskeletal: There is thoracic kyphosis and multilevel contiguous bridging enthesopathy. The ribcage is intact. Unremarkable visualized chest wall structures. Review of the MIP images confirms the above findings. IMPRESSION: 1. No evidence of arterial embolism, with suboptimal opacification in the apical and basal peripheral small arteries. 2. Cardiomegaly without findings of acute CHF. 3. Trace calcification left main, LAD coronary artery. No significant aortic plaques. 4. Bronchitis in the lower lobes without evidence of bronchopneumonia. 5. Hepatic steatosis. Electronically Signed   By: KTelford NabM.D.   On: 10/24/2021 20:12   DG Chest 2  View  Result Date: 10/24/2021 CLINICAL DATA:  Cough. EXAM: CHEST - 2 VIEW COMPARISON:  Jul 22, 2021 FINDINGS: The heart size and mediastinal contours are within normal limits. Both lungs are clear. The visualized skeletal structures are unremarkable. IMPRESSION: The study is limited due to low volumes. No focal infiltrate identified. Electronically Signed   By: DDorise BullionIII M.D.   On: 10/24/2021 16:56   ECHOCARDIOGRAM COMPLETE  Result Date: 09/28/2021    ECHOCARDIOGRAM REPORT   [facility] Date of Exam: 09/28/2021 Medical Rec #:  [id number]  Height:       66.0 in Accession #:    [id number]       Weight:       300.0 lb Date of Birth:  13-Jan-1966        BSA:          2.376 m Patient Age:    49 years         BP:           134/85 mmHg Patient Gender: F                HR:           78 bpm. Exam Location:  [place] Procedure: 2D Echo, Cardiac Doppler, Color Doppler and Strain Analysis Indications:    PAF (paroxysmal atrial fibrillation) (HCC) [I48.0 (ICD-10-CM)];                 Chest pain of uncertain etiology [H84.6 (ICD-10-CM)]  History:        Patient has no prior history of Echocardiogram examinations.                 Arrythmias:Atrial Fibrillation; Risk Factors:Hypertension,                 Dyslipidemia and Diabetes.  Sonographer:    Philipp Deputy RDCS Referring Phys: [place]  1. Left ventricular ejection fraction, by estimation, is 60 to 65%. The left ventricle has normal function. The left ventricle has no regional wall motion abnormalities. There is moderate left ventricular hypertrophy. Left ventricular diastolic parameters are consistent with Grade I diastolic dysfunction (impaired relaxation).  2. Right ventricular systolic function is normal. The right ventricular size is normal.  3. Left atrial size was mildly dilated.  4. There is dilatation of the ascending aorta, measuring 39 mm. FINDINGS  Left Ventricle: Left ventricular ejection  fraction, by estimation, is 60 to 65%. The left ventricle has normal function. The left ventricle has no regional wall motion abnormalities. The left ventricular internal cavity size was normal in size. There is  moderate left ventricular hypertrophy. Left ventricular diastolic parameters are consistent with Grade I diastolic dysfunction (impaired relaxation). Right Ventricle: The right ventricular size is normal. No increase in right ventricular wall thickness. Right ventricular systolic function is normal. Left Atrium: Left atrial size was mildly dilated. Right Atrium: Right atrial size was normal in size. Pericardium: There is no evidence of pericardial effusion. Mitral Valve: The mitral valve is normal in structure. No evidence of mitral valve regurgitation. No evidence of mitral valve stenosis. Tricuspid Valve: The tricuspid valve is normal in structure. Tricuspid valve regurgitation is not demonstrated. No evidence of tricuspid stenosis. Aortic Valve: The aortic valve is normal in structure. Aortic valve regurgitation is not visualized. No aortic stenosis is present. Pulmonic Valve: The pulmonic valve was normal in structure. Pulmonic valve regurgitation is not visualized. No evidence of pulmonic stenosis. Aorta: The aortic root is normal in size and structure. There is dilatation of the ascending aorta, measuring 39 mm. Venous: The inferior vena cava is normal in size with greater than 50% respiratory variability, suggesting right atrial pressure of 3 mmHg. IAS/Shunts: No atrial level shunt detected by color flow Doppler.  LEFT VENTRICLE PLAX 2D LVIDd:         4.80 cm   Diastology LVIDs:         2.90 cm   LV e' medial:    6.85 cm/s LV PW:  1.40 cm   LV E/e' medial:  13.0 LV IVS:        1.40 cm   LV e' lateral:   6.64 cm/s LVOT diam:     2.10 cm   LV E/e' lateral: 13.4 LV SV:         83 LV SV Index:   35 LVOT Area:     3.46 cm  RIGHT VENTRICLE             IVC RV Basal diam:  2.70 cm     IVC diam: 1.80  cm RV S prime:     12.70 cm/s TAPSE (M-mode): 2.3 cm LEFT ATRIUM             Index        RIGHT ATRIUM           Index LA diam:        3.30 cm 1.39 cm/m   RA Area:     13.30 cm LA Vol (A2C):   41.8 ml 17.59 ml/m  RA Volume:   24.00 ml  10.10 ml/m LA Vol (A4C):   49.2 ml 20.71 ml/m LA Biplane Vol: 46.8 ml 19.70 ml/m  AORTIC VALVE LVOT Vmax:   106.00 cm/s LVOT Vmean:  68.600 cm/s LVOT VTI:    0.241 m  AORTA Ao Root diam: 3.60 cm Ao Asc diam:  3.90 cm Ao Desc diam: 2.10 cm MITRAL VALVE MV Area (PHT): 2.66 cm     SHUNTS MV Decel Time: 285 msec     Systemic VTI:  0.24 m MV E velocity: 89.30 cm/s   Systemic Diam: 2.10 cm MV A velocity: 133.00 cm/s MV E/A ratio:  0.67 Sunny Schlein Revankar MD Electronically signed by Jyl Heinz MD Signature Date/Time: 09/28/2021/1:14:29 PM    Final     Labs: BNP (last 3 results) No results for input(s): "BNP" in the last 8760 hours. Basic Metabolic Panel: Recent Labs  Lab 10/24/21 1747 10/25/21 0446  NA 138 139  K 3.4* 4.0  CL 105 107  CO2 26 21*  GLUCOSE 132* 245*  BUN 23* 20  CREATININE 1.09* 0.68  CALCIUM 8.6* 8.9   Liver Function Tests: No results for input(s): "AST", "ALT", "ALKPHOS", "BILITOT", "PROT", "ALBUMIN" in the last 168 hours. No results for input(s): "LIPASE", "AMYLASE" in the last 168 hours. No results for input(s): "AMMONIA" in the last 168 hours. CBC: Recent Labs  Lab 10/24/21 1747  WBC 7.7  HGB 12.4  HCT 39.9  MCV 87.3  PLT 173   Cardiac Enzymes: No results for input(s): "CKTOTAL", "CKMB", "CKMBINDEX", "TROPONINI" in the last 168 hours. BNP: Invalid input(s): "POCBNP" CBG: Recent Labs  Lab 10/25/21 0732  GLUCAP 205*   D-Dimer No results for input(s): "DDIMER" in the last 72 hours. Hgb A1c No results for input(s): "HGBA1C" in the last 72 hours. Lipid Profile No results for input(s): "CHOL", "HDL", "LDLCALC", "TRIG", "CHOLHDL", "LDLDIRECT" in the last 72 hours. Thyroid function studies No results for input(s): "TSH",  "T4TOTAL", "T3FREE", "THYROIDAB" in the last 72 hours.  Invalid input(s): "FREET3" Anemia work up No results for input(s): "VITAMINB12", "FOLATE", "FERRITIN", "TIBC", "IRON", "RETICCTPCT" in the last 72 hours. Urinalysis    Component Value Date/Time   COLORURINE STRAW (A) 05/24/2021 0935   APPEARANCEUR CLEAR 05/24/2021 0935   LABSPEC 1.020 05/24/2021 0935   PHURINE 5.0 05/24/2021 0935   GLUCOSEU >=500 (A) 05/24/2021 0935   HGBUR NEGATIVE 05/24/2021 0935   HGBUR negative 07/05/2006 0948   BILIRUBINUR NEGATIVE  05/24/2021 0935   KETONESUR NEGATIVE 05/24/2021 0935   PROTEINUR NEGATIVE 05/24/2021 0935   UROBILINOGEN 0.2 09/14/2014 1500   NITRITE NEGATIVE 05/24/2021 0935   LEUKOCYTESUR NEGATIVE 05/24/2021 0935   Sepsis Labs Recent Labs  Lab 10/24/21 1747  WBC 7.7   Microbiology Recent Results (from the past 240 hour(s))  SARS Coronavirus 2 by RT PCR (hospital order, performed in [facility] hospital lab) *cepheid single result test* Anterior Nasal Swab     Status: None   Collection Time: 10/24/21  6:41 PM   Specimen: Anterior Nasal Swab  Result Value Ref Range Status   SARS Coronavirus 2 by RT PCR NEGATIVE NEGATIVE Final    Comment: (NOTE) SARS-CoV-2 target nucleic acids are NOT DETECTED.  The SARS-CoV-2 RNA is generally detectable in upper and lower respiratory specimens during the acute phase of infection. The lowest concentration of SARS-CoV-2 viral copies this assay can detect is 250 copies / mL. A negative result does not preclude SARS-CoV-2 infection and should not be used as the sole basis for treatment or other patient management decisions.  A negative result may occur with improper specimen collection / handling, submission of specimen other than nasopharyngeal swab, presence of viral mutation(s) within the areas targeted by this assay, and inadequate number of viral copies (<250 copies / mL). A negative result must be combined with clinical observations, patient  history, and epidemiological information.  Fact Sheet for Patients:   https://www.patel.info/  Fact Sheet for Healthcare Providers: https://hall.com/  This test is not yet approved or  cleared by the [place] FDA and has been authorized for detection and/or diagnosis of SARS-CoV-2 by FDA under an Emergency Use Authorization (EUA).  This EUA will remain in effect (meaning this test can be used) for the duration of the COVID-19 declaration under Section 564(b)(1) of the Act, 21 U.S.C. section 360bbb-3(b)(1), unless the authorization is terminated or revoked sooner.  Performed at [facility], [address] [place], [place] [address]      Time coordinating discharge: 25 minutes  SIGNED: Antonieta Pert, MD  [facility] 10/25/2021, 10:05 AM  If 7PM-7AM, please contact night-coverage [email or web address]

## 2021-10-25 NOTE — Hospital Course (Addendum)
56 y.o.f w/ history significant of Paroxysmal atrial fibrillation on Eliquis, moderate persistent asthma, hypertension, hypothyroidism who presented with increasing shortness of breath and hemoptysis, symptoms onset 9 days PTA with cough, runny nose with progressively worsening increasing shortness of breath.Ran out of her albuterol inhaler about 3 days  PTA. She had blood-tinged sputum prompting her to present to the ED. She also has chest pain worse with coughing. In the ED, she was afebrile, hypertensive 135/103 with documented 8 L although patient states that she was never placed on oxygen in the ED. She was given DuoNeb, albuterol nebulizer, IV 125 mg Solu-Medrol. No leukocytosis or anemia.  AKI with creatinine 1.09 up from 0.78. CTA chest negative for PE but with with signs of bronchitis.COVID PCR negative.  Patient admitted for acute asthma exacerbation, AKI. Respiratory status has improved.  Overall doing much better.this morning she has been ambulating well without tachypnea hypoxia or shortness of breath, and feels well enough to go home.  Discharge home on steroid taper and inhalers.

## 2021-10-25 NOTE — Assessment & Plan Note (Addendum)
Continue metoprolol -hold ARB and Torsemide due to AKI

## 2021-12-10 ENCOUNTER — Telehealth: Payer: Self-pay | Admitting: *Deleted

## 2021-12-10 MED ORDER — PROPAFENONE HCL ER 325 MG PO CP12: 325.0000 mg | ORAL_CAPSULE | Freq: Two times a day (BID) | ORAL | 6 refills | Status: DC

## 2021-12-10 NOTE — Telephone Encounter (Signed)
Rx refill sent to pharmacy. Spoke with pt about Eliquis samples that had been left up front for her to pick up. She stated she cannot take the Eliquis or Xarelto because it makes her pass out. She would like a refill of the Propafenone so sent it in to her pharmarcy. Pt had no more questions at this time.

## 2022-03-14 IMAGING — DX DG CHEST 2V
2 series · 2 of 2 positions shown · non-contrast
Comparison: 03/30/2021

CLINICAL DATA: Epistaxis and hemoptysis

EXAM:
CHEST - 2 VIEW

[chest pa]
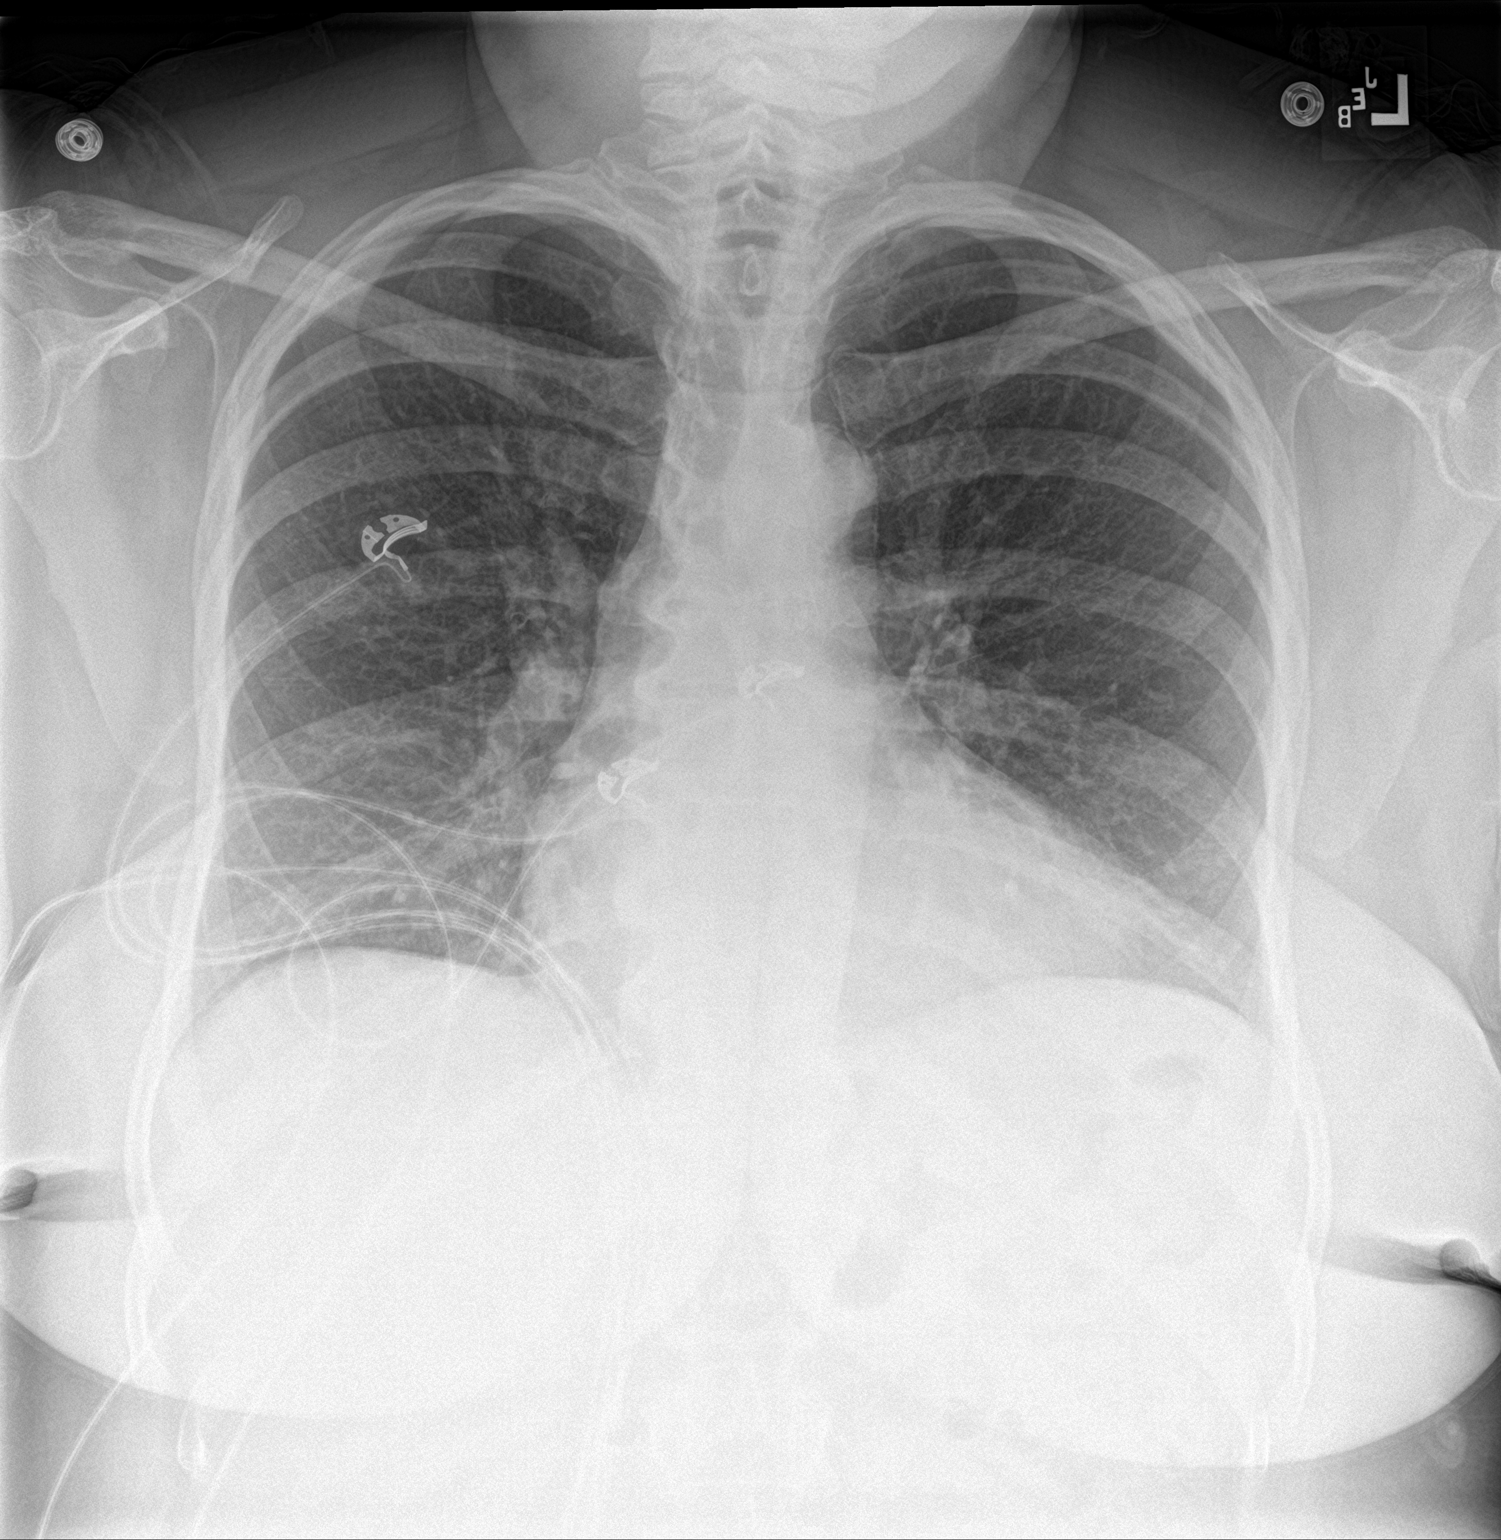

[chest lat]
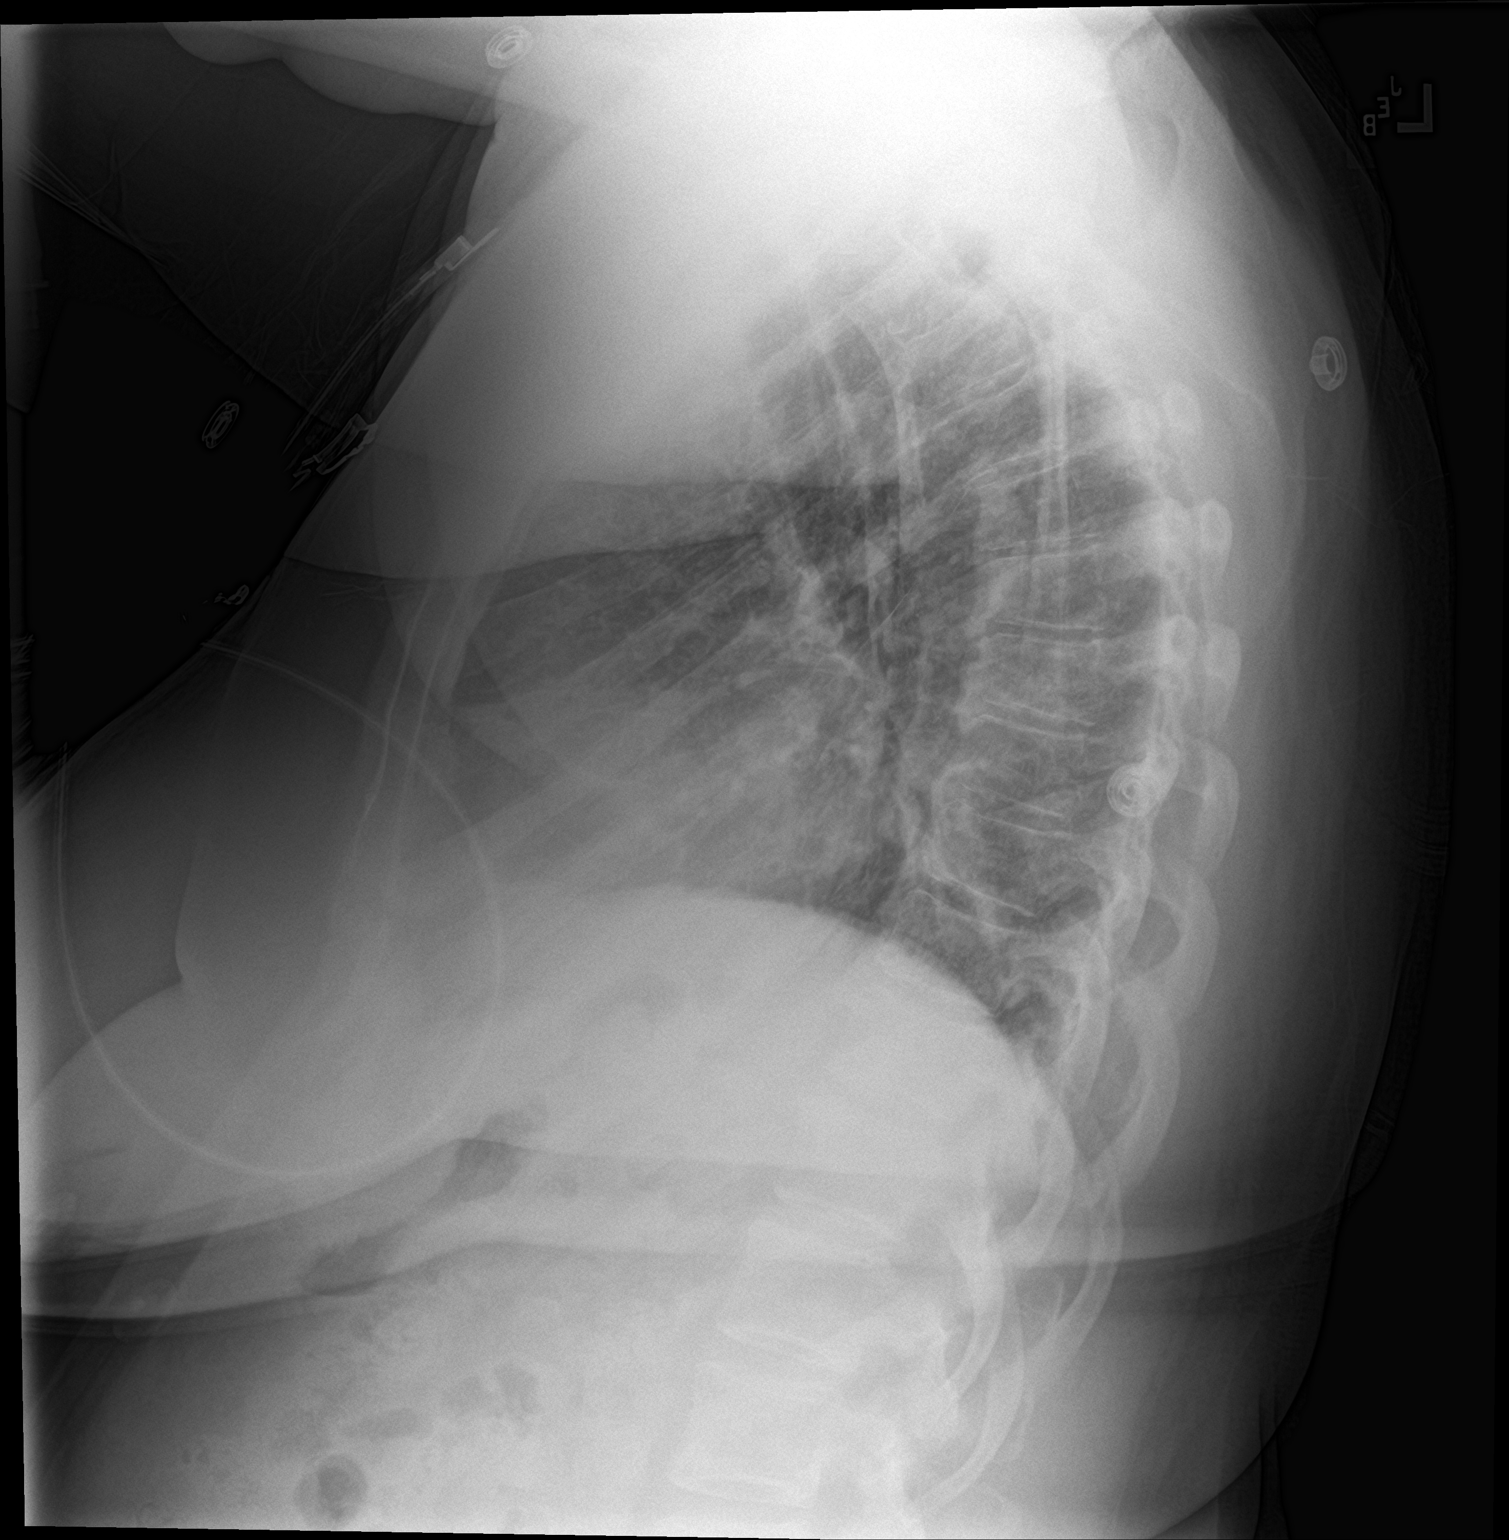

[2 of 2 positions shown; findings below may reference images not displayed]

FINDINGS: The heart size and mediastinal contours are within normal limits.
Both lungs are clear. The visualized skeletal structures are
unremarkable.
IMPRESSION: No active cardiopulmonary disease.

## 2022-03-14 IMAGING — CT CT ANGIO CHEST
3 of 7 series · 19 of 36 positions shown · IV contrast (agent unspecified)
Comparison: 04/06/2020.

CLINICAL DATA: Intermittent epistaxis and hemoptysis for the last
week. Left neck muscle spasms and diffuse abdominal pain.

EXAM:
CT ANGIOGRAPHY CHEST WITH CONTRAST
TECHNIQUE: Multidetector CT imaging of the chest was performed using the
standard protocol during bolus administration of intravenous
contrast. Multiplanar CT image reconstructions and MIPs were
obtained to evaluate the vascular anatomy.

[Series 6: pe lung · axial · 0.65mm/px · z∈[+1241,+1355]mm · 3 of 115 slices shown]
[im 29/115  mediastinal]
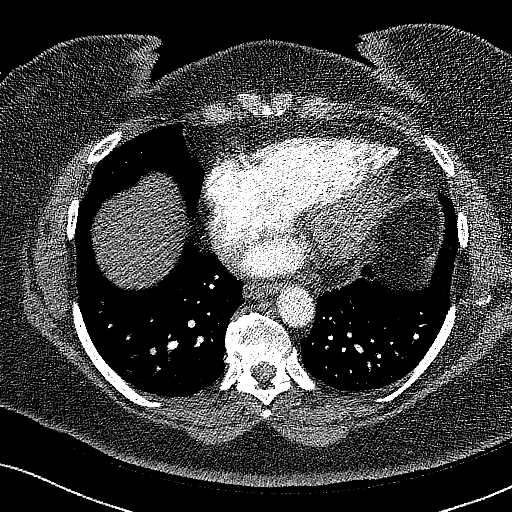
[im 58/115  mediastinal]
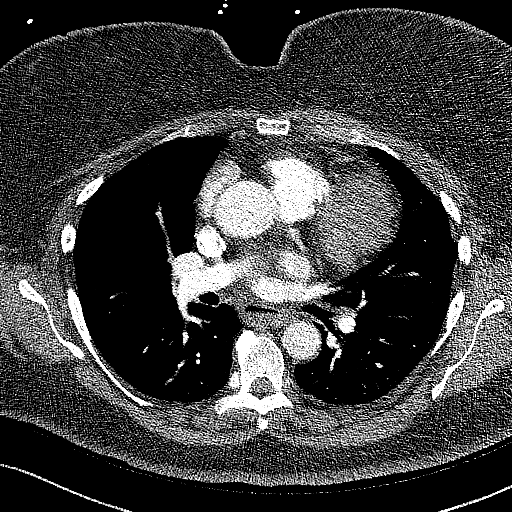
[im 86/115  mediastinal]
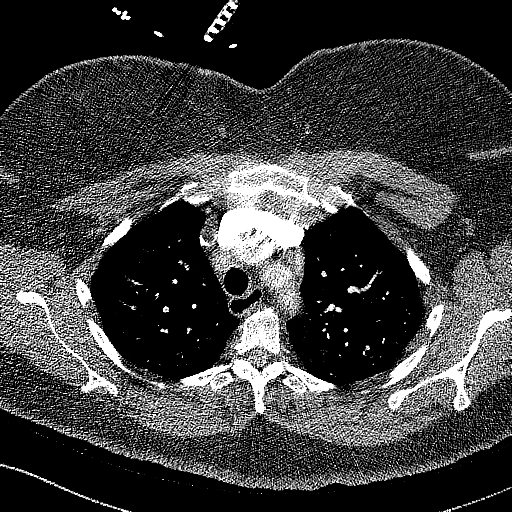

[Series 7: pe thins · axial · 0.65mm/px · z∈[+1163,+1398]mm · 15 of 383 slices shown]
[im 24/383  lung]
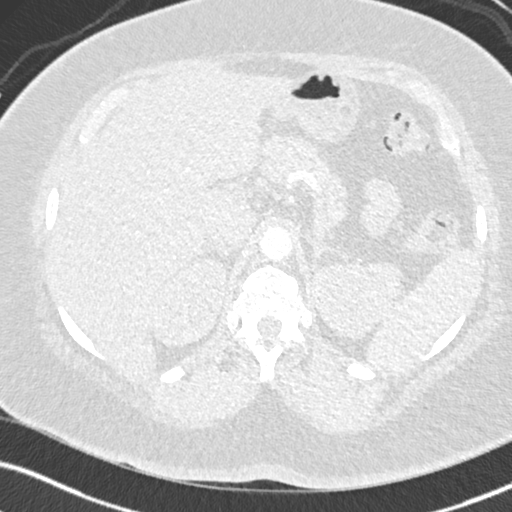
[im 48/383  mediastinal]
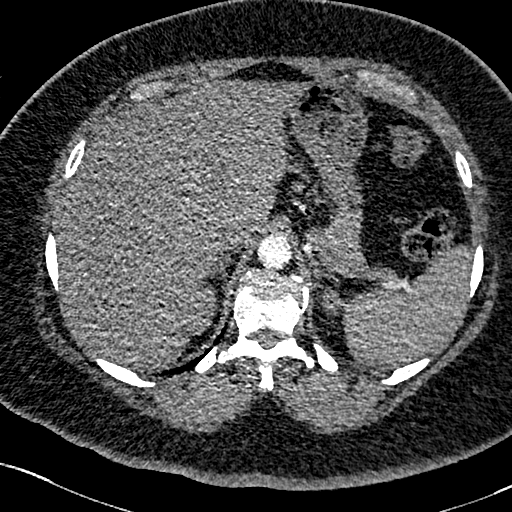
[im 72/383  lung]
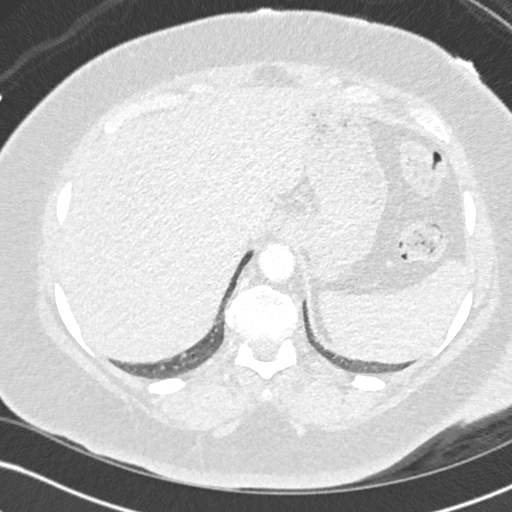
[im 96/383  mediastinal]
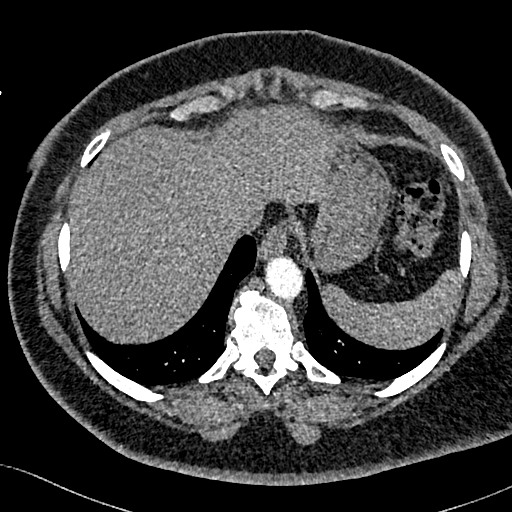
[im 120/383  lung]
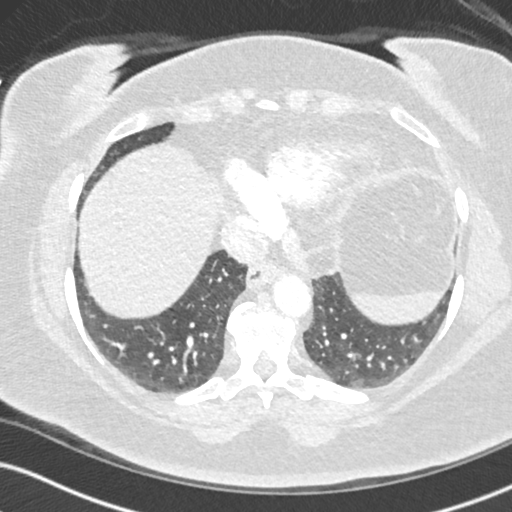
[im 144/383  mediastinal]
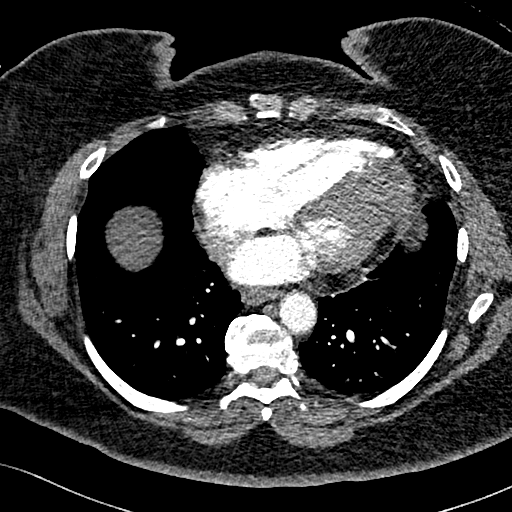
[im 168/383  lung]
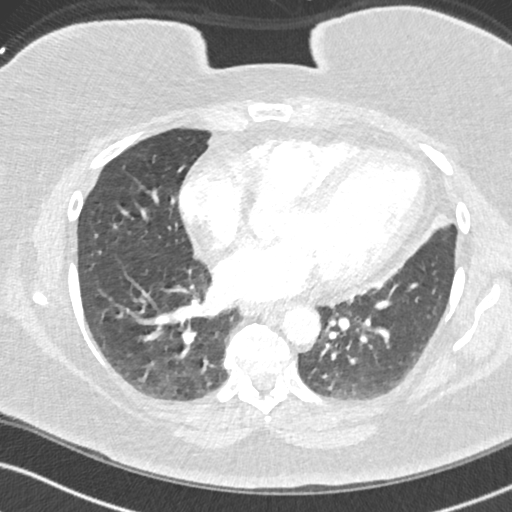
[im 192/383  mediastinal]
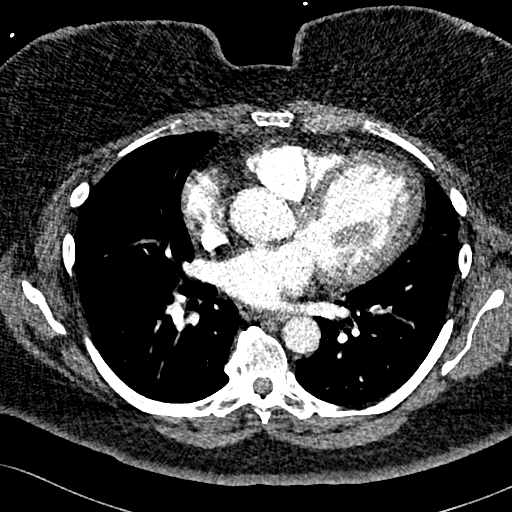
[im 215/383  lung]
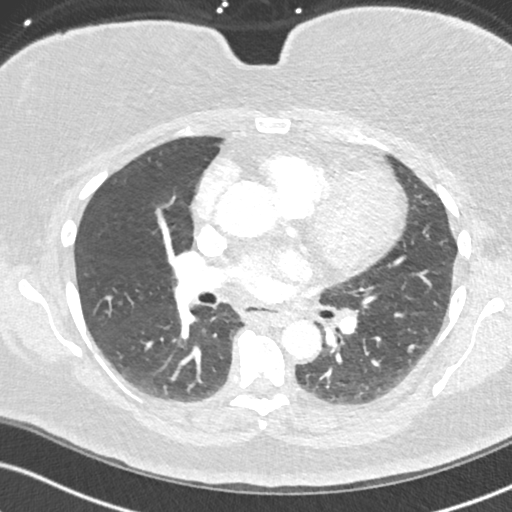
[im 239/383  mediastinal]
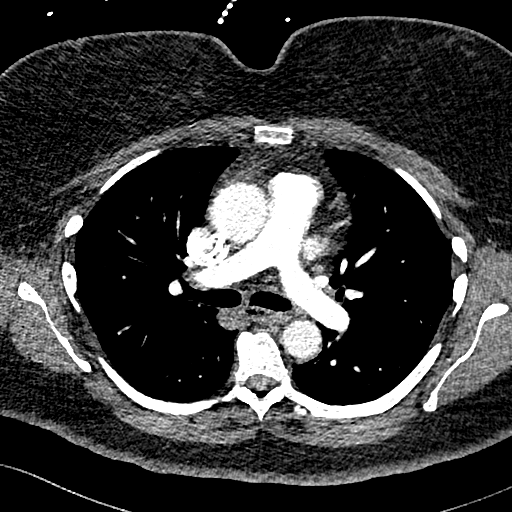
[im 263/383  lung]
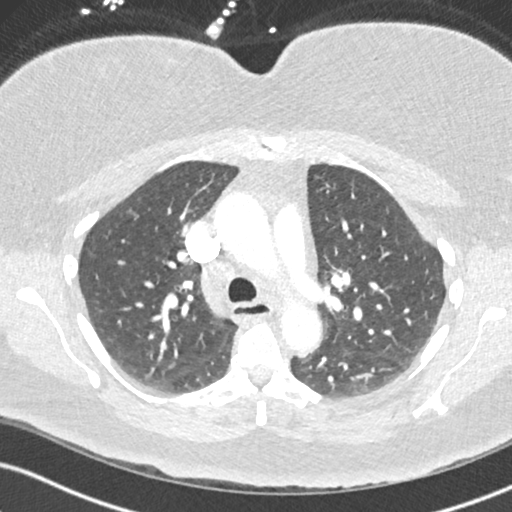
[im 287/383  mediastinal]
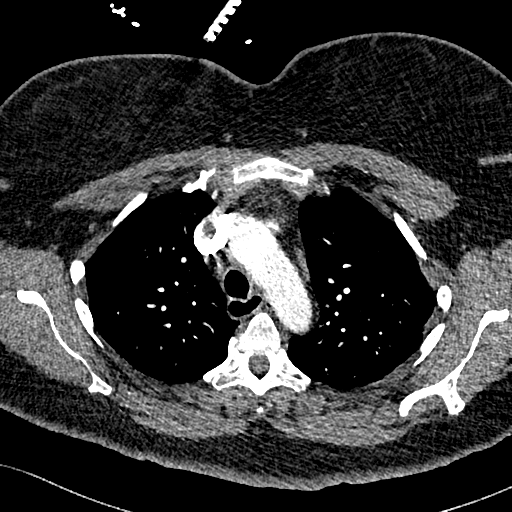
[im 311/383  lung]
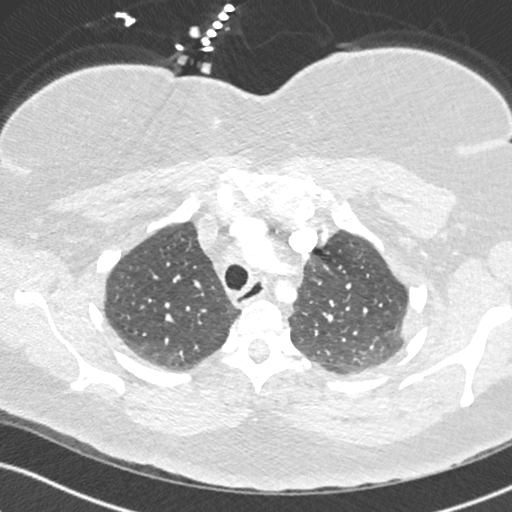
[im 335/383  mediastinal]
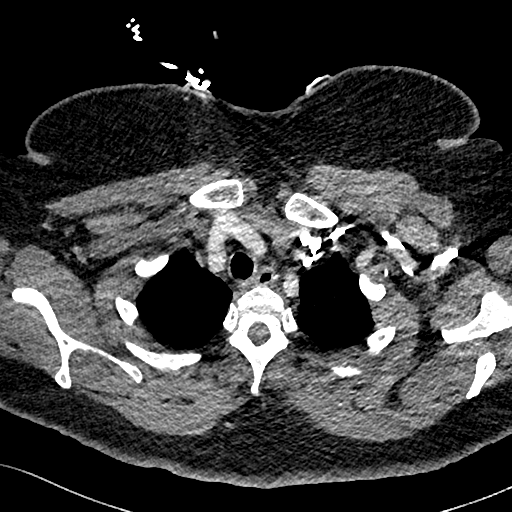
[im 359/383  lung]
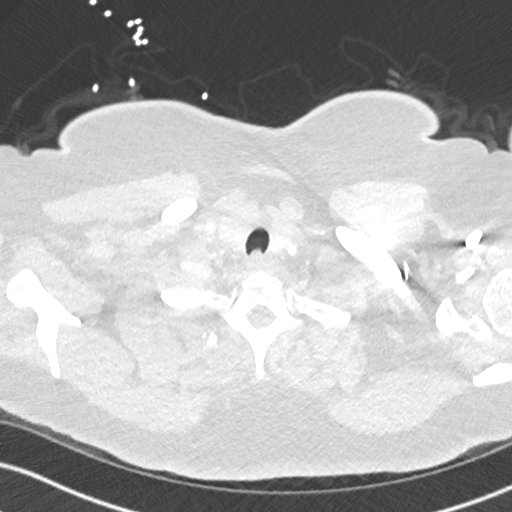

[Series 8: pe 2mm cor · coronal · 0.59mm/px · 1 of 151 slices shown]
[im 76/151  mediastinal]
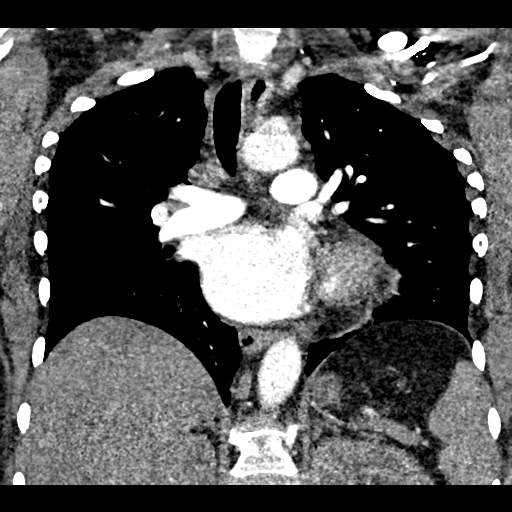

[19 of 36 positions shown; findings below may reference images not displayed]

RADIATION DOSE REDUCTION: This exam was performed according to the
departmental dose-optimization program which includes automated
exposure control, adjustment of the mA and/or kV according to
patient size and/or use of iterative reconstruction technique.

CONTRAST:  100mL OMNIPAQUE IOHEXOL 350 MG/ML SOLN
FINDINGS: Cardiovascular: Image quality is somewhat degraded by respiratory
motion. Negative for pulmonary embolus. Heart is enlarged. No
pericardial effusion.

Mediastinum/Nodes: Mediastinal lymph nodes measure up to 11 mm in
the low right paratracheal station, as on 04/06/2020. No hilar or
axillary adenopathy. Esophagus is minimally dilated which can be
seen with dysmotility.

Lungs/Pleura: Image quality is degraded by respiratory motion. No
airspace consolidation or pleural fluid. Minimal scarring in the
lingula and anterior left lower lobe. Airway is unremarkable.

Upper Abdomen: Visualized portions of the liver, adrenal glands,
kidneys, spleen, pancreas, stomach and bowel are grossly
unremarkable.

Musculoskeletal: Degenerative changes in the spine. Flowing anterior
osteophytosis in the thoracic spine. No worrisome lytic or sclerotic
lesions.

Review of the MIP images confirms the above findings.
IMPRESSION: Negative for pulmonary embolus.

## 2022-05-31 LAB — MICROALBUMIN / CREATININE URINE RATIO
Albumin, Urine POC: 69.5
Creatinine, Urine.: 50.2
Microalb Creat Ratio: 138

## 2022-05-31 LAB — HEMOGLOBIN A1C: A1c: 8.3

## 2022-06-09 ENCOUNTER — Other Ambulatory Visit: Payer: Self-pay

## 2022-06-17 ENCOUNTER — Ambulatory Visit: Payer: 59 | Admitting: Cardiology

## 2022-06-21 ENCOUNTER — Ambulatory Visit (INDEPENDENT_AMBULATORY_CARE_PROVIDER_SITE_OTHER): Payer: 59 | Admitting: Podiatry

## 2022-06-21 DIAGNOSIS — Z91199 Patient's noncompliance with other medical treatment and regimen due to unspecified reason: Secondary | ICD-10-CM

## 2022-06-21 NOTE — Progress Notes (Signed)
Pt was a no show for apt, charge generated 

## 2022-06-23 ENCOUNTER — Encounter: Payer: Self-pay | Admitting: Cardiology

## 2022-06-23 ENCOUNTER — Ambulatory Visit: Payer: 59 | Attending: Cardiology | Admitting: Cardiology

## 2022-06-23 VITALS — BP 154/90 | HR 80 | Ht 66.0 in | Wt 305.8 lb

## 2022-06-23 DIAGNOSIS — I2510 Atherosclerotic heart disease of native coronary artery without angina pectoris: Secondary | ICD-10-CM | POA: Diagnosis not present

## 2022-06-23 DIAGNOSIS — I480 Paroxysmal atrial fibrillation: Secondary | ICD-10-CM | POA: Diagnosis not present

## 2022-06-23 DIAGNOSIS — E1141 Type 2 diabetes mellitus with diabetic mononeuropathy: Secondary | ICD-10-CM | POA: Diagnosis not present

## 2022-06-23 DIAGNOSIS — I10 Essential (primary) hypertension: Secondary | ICD-10-CM | POA: Diagnosis not present

## 2022-06-23 DIAGNOSIS — I2584 Coronary atherosclerosis due to calcified coronary lesion: Secondary | ICD-10-CM

## 2022-06-23 DIAGNOSIS — E782 Mixed hyperlipidemia: Secondary | ICD-10-CM

## 2022-06-23 HISTORY — DX: Atherosclerotic heart disease of native coronary artery without angina pectoris: I25.10

## 2022-06-23 MED ORDER — RIVAROXABAN 20 MG PO TABS
20.0000 mg | ORAL_TABLET | Freq: Every day | ORAL | 12 refills | Status: DC
Start: 2022-06-23 — End: 2022-11-25

## 2022-06-23 MED ORDER — METOPROLOL TARTRATE 25 MG PO TABS: 25.0000 mg | ORAL_TABLET | ORAL | 3 refills | Status: DC | PRN

## 2022-06-23 NOTE — Patient Instructions (Signed)
Medication Instructions:  Your physician has recommended you make the following change in your medication:   Stop Eliquis  Start Xarelto 20 mg daily with your largest meal of the day.  Use metoprolol tartarate 25 mg as needed for breakthrough palpations.  *If you need a refill on your cardiac medications before your next appointment, please call your pharmacy*   Lab Work: None ordered If you have labs (blood work) drawn today and your tests are completely normal, you will receive your results only by: MyChart Message (if you have MyChart) OR A paper copy in the mail If you have any lab test that is abnormal or we need to change your treatment, we will call you to review the results.   Testing/Procedures: None ordered   Follow-Up: At [facility], you and your health needs are our priority.  As part of our continuing mission to provide you with exceptional heart care, we have created designated Provider Care Teams.  These Care Teams include your primary Cardiologist (physician) and Advanced Practice Providers (APPs -  Physician Assistants and Nurse Practitioners) who all work together to provide you with the care you need, when you need it.  We recommend signing up for the patient portal called "MyChart".  Sign up information is provided on this After Visit Summary.  MyChart is used to connect with patients for Virtual Visits (Telemedicine).  Patients are able to view lab/test results, encounter notes, upcoming appointments, etc.  Non-urgent messages can be sent to your provider as well.   To learn more about what you can do with MyChart, go to [email or web address].    Your next appointment:   9 month(s)  The format for your next appointment:   In Person  Provider:   Belva Crome, MD    Other Instructions none  Important Information About Sugar

## 2022-06-23 NOTE — Progress Notes (Signed)
Cardiology Office Note:    Date:  06/23/2022   ID:  Tonya Quinn, DOB Mar 18, 1965, MRN [id number]  PCP:  Simone Curia, MD  Cardiologist:  Garwin Brothers, MD   Referring MD: Simone Curia, MD    ASSESSMENT:    1. Coronary artery calcification   2. Essential hypertension   3. PAF (paroxysmal atrial fibrillation)   4. Diabetic mononeuropathy associated with type 2 diabetes mellitus   5. Mixed hyperlipidemia    PLAN:    In order of problems listed above:  Coronary artery calcification: Secondary prevention stressed with the patient.  Importance of compliance with diet medication stressed and she vocalized understanding.  She was advised to walk to the best of her ability on a regular basis preoperatively abnormality. Paroxysmal atrial fibrillation:I discussed with the patient atrial fibrillation, disease process. Management and therapy including rate and rhythm control, anticoagulation benefits and potential risks were discussed extensively with the patient. Patient had multiple questions which were answered to patient's satisfaction.  She is willing to try Xarelto.  Benefits and potential risks.  She was advised not to put any medication without talking to [place]. Mixed dyslipidemia and diabetes mellitus: These are not to the best control.  I discussed this with her.  Lifestyle modification was urged.  She is not keen on adding or changing any medications.  I explained risks and I respect her wishes. Morbid obesity: Weight reduction stressed risks of obesity explained and she promises to do better. Patient will be seen in follow-up appointment in 6 months or earlier if the patient has any concerns.    Medication Adjustments/Labs and Tests Ordered: Current medicines are reviewed at length with the patient today.  Concerns regarding medicines are outlined above.  No orders of the defined types were placed in this encounter.  No orders of the defined types were placed in this  encounter.    No chief complaint on file.    History of Present Illness:    Tonya Quinn is a 58 y.o. female.  Patient has past medical history of coronary artery calcification, essential hypertension, dyslipidemia, diabetes mellitus and morbid obesity.  She has history of paroxysmal afibrillation.  She denies any problems at this time and takes care of activities of daily living.  She mentions to me that stopped anticoagulation because she was not tolerating the medicine well.  She did not tell us about it.  Past Medical History:  Diagnosis Date   Acute asthma exacerbation 10/24/2021   Adult BMI 45.0-49.9 kg/sq m    AKI (acute kidney injury) 10/25/2021   ALLERGIC RHINITIS 07/05/2006   Qualifier: Diagnosis of  By: Tressia Danas  MD, Adlih     Anxiety 04/07/2015   Asthma    Atrial fibrillation    Atrial flutter 01/02/2015   BACK PAIN 07/05/2006   Qualifier: Diagnosis of  By: Tressia Danas  MD, Adlih     Benign essential hypertension    Bronchitis 11/24/2014   Cervical spine pain 07/11/2019   Chest pain of uncertain etiology 08/30/2021   Chronic fatigue 08/30/2021   Chronic midline thoracic back pain 07/11/2019   Common cold 11/04/2014   Constipation by delayed colonic transit 09/10/2014   Current use of insulin 10/16/2018   DDD (degenerative disc disease), lumbar    Degeneration of intervertebral disc of lumbar region 07/13/2015   Degenerative disc disease, 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   Diabetes mellitus without complication 04/07/2015   Diabetic neuropathy associated with type 2 diabetes mellitus 09/12/2017   Diabetic polyneuropathy  associated with type 2 diabetes mellitus 10/23/2018   Dyspnea, unspecified 01/02/2015   Dysuria 07/05/2006   Qualifier: Diagnosis of  By: Tomasa Rand     Elevated lactic acid level 01/02/2015   Essential hypertension    Excessive sleepiness 11/12/2013   Fibroids 03/01/2012   Fibromyalgia 11/12/2013   Generalized anxiety disorder 09/19/2014    Generalized osteoarthritis of multiple sites    GERD (gastroesophageal reflux disease)    Heart palpitations 01/02/2015   HLD (hyperlipidemia)    Hot flash, menopausal 09/24/2015   Formatting of this note might be different from the original. Pt with multiple complaints that have been ongoing since prior to her hyst.  LAVH/BSO in 2013 at [facility].  C/o bloating, breast tenderness, mood changes, pelvic pain. Check labs as below to eval for menopause vs ? Ovarian remnant.  Imaging reviewed as far back as 2015. Benign.  RTC 4wks for follow up. Again discussed that her symptoms a   Hyperglycemia 01/02/2015   Hyperlipidemia associated with type 2 diabetes mellitus 12/27/2018   Hyperopia of both eyes with astigmatism and presbyopia 10/16/2018   Hypertension    Hypertension associated with type 2 diabetes mellitus 07/13/2015   Hypokalemia    Hypomagnesemia    Hypothyroidism    Left-sided weakness 04/07/2015   Long term current use of oral hypoglycemic drug 10/16/2018   LOW BACK PAIN, CHRONIC 07/05/2006   Qualifier: Diagnosis of  By: Moreno-Coll  MD, Adlih     Lower extremity edema    Major depressive disorder, recurrent episode, moderate 11/27/2012   Microalbuminuria due to type 2 diabetes mellitus 07/14/2018   Morbid obesity    Nuclear sclerotic cataract of both eyes 10/16/2018   Obesity    PAF (paroxysmal atrial fibrillation) 08/30/2021   PMS (premenstrual syndrome) 12/28/2017   Polyarthralgia 08/05/2021   Postmenopausal bleeding 12/28/2017   Sepsis 01/02/2015   Sleep apnea    Status post hysterectomy 09/30/2019   SVT (supraventricular tachycardia) 01/02/2015   Type 2 diabetes mellitus with diabetic neuropathy, with long-term current use of insulin 12/27/2018   Type 2 diabetes mellitus with obesity    Uncontrolled type 2 diabetes mellitus with hyperglycemia 04/07/2015   Formatting of this note might be different from the original. Uncontrolled. A1c 7/17- 10.9   Uninsured medical  expenses 09/10/2014   Vaginal dryness, menopausal 07/13/2015   Formatting of this note might be different from the original. Some atrophy present. She desires oral estrogen, but will not rx given her multiple comorbidities. Start vaginal premarin RTC 3 months for f/u.   Vitreous floater, bilateral 10/16/2018   Weakness 04/08/2015    Past Surgical History:  Procedure Laterality Date   ABDOMINAL HYSTERECTOMY     CYSTOSCOPY  02/29/2012   Procedure: CYSTOSCOPY;  Surgeon: Lavina Hamman, MD;  Location: [facility];  Service: Gynecology;  Laterality: N/A;   LAPAROSCOPIC ASSISTED VAGINAL HYSTERECTOMY  02/29/2012   Procedure: LAPAROSCOPIC ASSISTED VAGINAL HYSTERECTOMY;  Surgeon: Lavina Hamman, MD;  Location: [facility];  Service: Gynecology;  Laterality: N/A;   SALPINGOOPHORECTOMY  02/29/2012   Procedure: SALPINGO OOPHORECTOMY;  Surgeon: Lavina Hamman, MD;  Location: [facility];  Service: Gynecology;  Laterality: Bilateral;   uterine ablation     VEIN SURGERY      Current Medications: Current Meds  Medication Sig   acetaminophen (TYLENOL) 500 MG tablet Take 500 mg by mouth daily as needed (pain).   amLODipine (NORVASC) 5 MG tablet Take 5 mg by mouth daily.   cetirizine (ZYRTEC) 10 MG tablet Take 10  mg by mouth daily as needed for allergies.   Cholecalciferol (VITAMIN D3 PO) Take 1 capsule by mouth 3 (three) times a week.   Cyanocobalamin (VITAMIN B-12 PO) Take 1 capsule by mouth 3 (three) times a week.   diclofenac Sodium (VOLTAREN) 1 % GEL Apply 1 Application topically as needed (pain).   furosemide (LASIX) 20 MG tablet Take 20 mg by mouth daily as needed for fluid or edema.   gabapentin (NEURONTIN) 100 MG capsule Take 100 mg by mouth as needed (pain).   JARDIANCE 25 MG TABS tablet Take 25 mg by mouth daily.   linaclotide (LINZESS) 72 MCG capsule Take 72 mcg by mouth daily as needed (constipation).   pantoprazole (PROTONIX) 40 MG tablet Take 40 mg by mouth daily as needed (acid reflux).    Polyethylene Glycol 3350 (MIRALAX PO) Take 17 g by mouth 2 (two) times daily as needed (constipation).   predniSONE (DELTASONE) 10 MG tablet Take PO 4 tabs daily x 2 days,3 tabs daily x 2 days,2 tabs daily x 2 days,1 tab daily x 2 days then stop.   propafenone (RYTHMOL SR) 325 MG 12 hr capsule Take 1 capsule (325 mg total) by mouth 2 (two) times daily.   sodium chloride (OCEAN) 0.65 % SOLN nasal spray Place 1 spray into both nostrils as needed for congestion.   tirzepatide (MOUNJARO) 7.5 MG/0.5ML Pen Inject 7.5 mg into the skin once a week.   TRESIBA 100 UNIT/ML SOLN Inject 30 Units into the skin at bedtime.     Allergies:   Hydrochlorothiazide, Topiramate, Insulin lispro, Fluoxetine, Lisinopril, Sertraline, Insulin glargine, and Metformin and related   Social History   Socioeconomic History   Marital status: Widowed    Spouse name: Not on file   Number of children: Not on file   Years of education: Not on file   Highest education level: Not on file  Occupational History   Not on file  Tobacco Use   Smoking status: Never   Smokeless tobacco: Never  Vaping Use   Vaping Use: Never used  Substance and Sexual Activity   Alcohol use: No   Drug use: No   Sexual activity: Not on file  Other Topics Concern   Not on file  Social History Narrative   Not on file   Social Determinants of Health   Financial Resource Strain: Not on file  Food Insecurity: Not on file  Transportation Needs: Not on file  Physical Activity: Not on file  Stress: Not on file  Social Connections: Not on file     Family History: The patient's family history includes Diabetes in her brother, maternal grandmother, and paternal grandmother; Heart disease in her maternal grandmother; Hypertension in her brother, maternal grandmother, and paternal grandmother.  ROS:   Please see the history of present illness.    All other systems reviewed and are negative.  EKGs/Labs/Other Studies Reviewed:    The  following studies were reviewed today: I discussed my findings with the patient at length.   Recent Labs: 10/24/2021: Hemoglobin 12.4; Platelets 173 10/25/2021: BUN 20; Creatinine, Ser 0.68; Potassium 4.0; Sodium 139  Recent Lipid Panel    Component Value Date/Time   CHOL 216 (H) 04/07/2015 0125   TRIG 139 04/07/2015 0125   HDL 56 04/07/2015 0125   CHOLHDL 3.9 04/07/2015 0125   VLDL 28 04/07/2015 0125   LDLCALC 132 (H) 04/07/2015 0125    Physical Exam:    VS:  BP (!) 154/90   Pulse 80  Ht 5\' 6"  (1.676 m)   Wt (!) 305 lb 12.8 oz (138.7 kg)   LMP 02/16/2012   SpO2 95%   BMI 49.36 kg/m     Wt Readings from Last 3 Encounters:  06/23/22 (!) 305 lb 12.8 oz (138.7 kg)  10/24/21 300 lb (136.1 kg)  09/15/21 300 lb (136.1 kg)     GEN: Patient is in no acute distress HEENT: Normal NECK: No JVD; No carotid bruits LYMPHATICS: No lymphadenopathy CARDIAC: Hear sounds regular, 2/6 systolic murmur at the apex. RESPIRATORY:  Clear to auscultation without rales, wheezing or rhonchi  ABDOMEN: Soft, non-tender, non-distended MUSCULOSKELETAL:  No edema; No deformity  SKIN: Warm and dry NEUROLOGIC:  Alert and oriented x 3 PSYCHIATRIC:  Normal affect   Signed, Garwin Brothers, MD  06/23/2022 10:32 AM    [facility]

## 2022-06-24 ENCOUNTER — Ambulatory Visit: Payer: 59 | Admitting: Podiatry

## 2022-06-24 ENCOUNTER — Ambulatory Visit (INDEPENDENT_AMBULATORY_CARE_PROVIDER_SITE_OTHER): Payer: 59

## 2022-06-24 ENCOUNTER — Ambulatory Visit (INDEPENDENT_AMBULATORY_CARE_PROVIDER_SITE_OTHER): Payer: 59 | Admitting: Podiatry

## 2022-06-24 DIAGNOSIS — M19071 Primary osteoarthritis, right ankle and foot: Secondary | ICD-10-CM

## 2022-06-24 DIAGNOSIS — R52 Pain, unspecified: Secondary | ICD-10-CM

## 2022-06-24 DIAGNOSIS — E1142 Type 2 diabetes mellitus with diabetic polyneuropathy: Secondary | ICD-10-CM

## 2022-06-24 DIAGNOSIS — M216X1 Other acquired deformities of right foot: Secondary | ICD-10-CM

## 2022-06-24 DIAGNOSIS — M19072 Primary osteoarthritis, left ankle and foot: Secondary | ICD-10-CM

## 2022-06-24 DIAGNOSIS — M79671 Pain in right foot: Secondary | ICD-10-CM | POA: Diagnosis not present

## 2022-06-24 DIAGNOSIS — M2141 Flat foot [pes planus] (acquired), right foot: Secondary | ICD-10-CM

## 2022-06-24 DIAGNOSIS — M79672 Pain in left foot: Secondary | ICD-10-CM

## 2022-06-24 DIAGNOSIS — M2142 Flat foot [pes planus] (acquired), left foot: Secondary | ICD-10-CM

## 2022-06-24 MED ORDER — METHYLPREDNISOLONE 4 MG PO TBPK: ORAL_TABLET | ORAL | 0 refills | Status: DC

## 2022-06-24 NOTE — Progress Notes (Signed)
Subjective:  Patient ID: Tonya Quinn, female    DOB: Jan 13, 1966,  MRN: [id number]  Chief Complaint  Patient presents with   Foot Pain    Pain started in bilateral feet 6 months ago, pain is located in the top of foot, side of foot and near the ankle, patient has had prior treatment per dr stover via injection     57 y.o. female presents with pain on the right lateral ankle.  Has previous seen Dr. Marylene Land.  Diagnosed with ankle arthritis as well as midfoot arthritis.  Patient does have a history of diabetes with neuropathy.  Has flatfoot deformity.  Also wants to get diabetic shoes and inserts.  Past Medical History:  Diagnosis Date   Acute asthma exacerbation 10/24/2021   Adult BMI 45.0-49.9 kg/sq m    AKI (acute kidney injury) 10/25/2021   ALLERGIC RHINITIS 07/05/2006   Qualifier: Diagnosis of  By: Tressia Danas  MD, Adlih     Anxiety 04/07/2015   Asthma    Atrial fibrillation    Atrial flutter 01/02/2015   BACK PAIN 07/05/2006   Qualifier: Diagnosis of  By: Tressia Danas  MD, Adlih     Benign essential hypertension    Bronchitis 11/24/2014   Cervical spine pain 07/11/2019   Chest pain of uncertain etiology 08/30/2021   Chronic fatigue 08/30/2021   Chronic midline thoracic back pain 07/11/2019   Common cold 11/04/2014   Constipation by delayed colonic transit 09/10/2014   Current use of insulin 10/16/2018   DDD (degenerative disc disease), lumbar    Degeneration of intervertebral disc of lumbar region 07/13/2015   Degenerative disc disease, 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   Diabetes mellitus without complication 04/07/2015   Diabetic neuropathy associated with type 2 diabetes mellitus 09/12/2017   Diabetic polyneuropathy associated with type 2 diabetes mellitus 10/23/2018   Dyspnea, unspecified 01/02/2015   Dysuria 07/05/2006   Qualifier: Diagnosis of  By: Tomasa Rand     Elevated lactic acid level 01/02/2015   Essential hypertension    Excessive sleepiness 11/12/2013    Fibroids 03/01/2012   Fibromyalgia 11/12/2013   Generalized anxiety disorder 09/19/2014   Generalized osteoarthritis of multiple sites    GERD (gastroesophageal reflux disease)    Heart palpitations 01/02/2015   HLD (hyperlipidemia)    Hot flash, menopausal 09/24/2015   Formatting of this note might be different from the original. Pt with multiple complaints that have been ongoing since prior to her hyst.  LAVH/BSO in 2013 at [facility].  C/o bloating, breast tenderness, mood changes, pelvic pain. Check labs as below to eval for menopause vs ? Ovarian remnant.  Imaging reviewed as far back as 2015. Benign.  RTC 4wks for follow up. Again discussed that her symptoms a   Hyperglycemia 01/02/2015   Hyperlipidemia associated with type 2 diabetes mellitus 12/27/2018   Hyperopia of both eyes with astigmatism and presbyopia 10/16/2018   Hypertension    Hypertension associated with type 2 diabetes mellitus 07/13/2015   Hypokalemia    Hypomagnesemia    Hypothyroidism    Left-sided weakness 04/07/2015   Long term current use of oral hypoglycemic drug 10/16/2018   LOW BACK PAIN, CHRONIC 07/05/2006   Qualifier: Diagnosis of  By: Tressia Danas  MD, Adlih     Lower extremity edema    Major depressive disorder, recurrent episode, moderate 11/27/2012   Microalbuminuria due to type 2 diabetes mellitus 07/14/2018   Morbid obesity    Nuclear sclerotic cataract of both eyes 10/16/2018   Obesity    PAF (paroxysmal  atrial fibrillation) 08/30/2021   PMS (premenstrual syndrome) 12/28/2017   Polyarthralgia 08/05/2021   Postmenopausal bleeding 12/28/2017   Sepsis 01/02/2015   Sleep apnea    Status post hysterectomy 09/30/2019   SVT (supraventricular tachycardia) 01/02/2015   Type 2 diabetes mellitus with diabetic neuropathy, with long-term current use of insulin 12/27/2018   Type 2 diabetes mellitus with obesity    Uncontrolled type 2 diabetes mellitus with hyperglycemia 04/07/2015   Formatting of this note  might be different from the original. Uncontrolled. A1c 7/17- 10.9   Uninsured medical expenses 09/10/2014   Vaginal dryness, menopausal 07/13/2015   Formatting of this note might be different from the original. Some atrophy present. She desires oral estrogen, but will not rx given her multiple comorbidities. Start vaginal premarin RTC 3 months for f/u.   Vitreous floater, bilateral 10/16/2018   Weakness 04/08/2015    Allergies  Allergen Reactions   Hydrochlorothiazide Swelling    Leg cramps and pain in temples Hypokalemia - cramps    Topiramate Shortness Of Breath and Other (See Comments)    Constipation   Insulin Lispro Itching and Other (See Comments)    Pt reports Humalog 75/25 makes her constipated. Welts    Fluoxetine Other (See Comments)    nightmares   Lisinopril Other (See Comments)    Patient reports headache    Sertraline Other (See Comments)    Green/blue stool    Insulin Glargine Hives and Rash   Metformin And Related Diarrhea and Other (See Comments)    Abdominal Pain    ROS: Negative except as per HPI above  Objective:  General: AAO x3, NAD  Dermatological: With inspection and palpation of the right and left lower extremities there are no open sores, no preulcerative lesions, no rash or signs of infection present. Nails are of normal length thickness and coloration.   Vascular:  Dorsalis Pedis artery and Posterior Tibial artery pedal pulses are 2/4 bilateral.  Capillary fill time < 3 sec to all digits.   Neruologic: Grossly diminished via light touch bilateral  Musculoskeletal: Pain with palpation of the lateral aspect of the right ankle.  Ankle edema noted to the right ankle laterally.  Gait: Unassisted, Nonantalgic.   No images are attached to the encounter.  Radiographs:  Date: 06/24/2022 XR bilateral foot weightbearing AP/Lateral/Oblique   Findings: Arthritis noted about the bilateral midfoot and rear foot across the midfoot joints as well as the  rear foot at the tibiotalar and subtalar joints. Assessment:   1. DM type 2 with diabetic peripheral neuropathy   2. Pes planus of both feet   3. Acquired pronation deformity of right ankle   4. Arthritis of both ankles   5. Pain      Plan:  Patient was evaluated and treated and all questions answered.  # Midfoot and rear foot arthritis bilateral ankle right worse than left Discussed with patient does appear she has some evidence of midfoot and rear foot arthritis on the x-rays -Patient was previously gotten a steroid injection from Dr. Marylene Land -Recommend methylprednisolone 4 mg steroid taper pack take as directed for 6 days for pain and edema in the right ankle -Recommend cam boot immobilization for the right ankle to decrease pain and swelling.  Dispensed short cam boot to the patient at this time  # Diabetes type 2 with neuropathy and pes planus as well as pronation deformity -Patient is a good candidate for diabetic shoes and liners to decrease right rear foot and midfoot pain  and prevent ulceration on the plantar aspect of bilateral foot -Order for diabetic shoes and liners placed patient will follow-up in the [place] location for casting  No follow-ups on file.          Corinna Gab, DPM [facility]

## 2022-07-06 ENCOUNTER — Ambulatory Visit (INDEPENDENT_AMBULATORY_CARE_PROVIDER_SITE_OTHER): Payer: 59

## 2022-07-06 DIAGNOSIS — M2141 Flat foot [pes planus] (acquired), right foot: Secondary | ICD-10-CM

## 2022-07-06 DIAGNOSIS — E1142 Type 2 diabetes mellitus with diabetic polyneuropathy: Secondary | ICD-10-CM

## 2022-07-06 DIAGNOSIS — M2142 Flat foot [pes planus] (acquired), left foot: Secondary | ICD-10-CM

## 2022-07-06 NOTE — Progress Notes (Signed)
Patient presents to the office today for diabetic shoe and insole measuring.  ABN signed.   Documentation of medical necessity will be sent to patient's treating diabetic doctor to verify and sign.   Patient's diabetic provider: Betsey Amen, MD  NPI: [id number]   Shoes and insoles will be ordered at that time and patient will be notified for an appointment for fitting when they arrive.   Patient shoe selection-   1st   Shoe choice:   [id number]  Shoe size ordered: 12W

## 2022-08-23 ENCOUNTER — Ambulatory Visit (INDEPENDENT_AMBULATORY_CARE_PROVIDER_SITE_OTHER): Payer: 59 | Admitting: Podiatry

## 2022-08-23 DIAGNOSIS — M2141 Flat foot [pes planus] (acquired), right foot: Secondary | ICD-10-CM

## 2022-08-23 DIAGNOSIS — E1142 Type 2 diabetes mellitus with diabetic polyneuropathy: Secondary | ICD-10-CM

## 2022-08-23 DIAGNOSIS — M2142 Flat foot [pes planus] (acquired), left foot: Secondary | ICD-10-CM

## 2022-08-23 NOTE — Progress Notes (Signed)
The shoes that the patient had originally picked up out was discontinued.  I had the patient pick out 3 more selections [phone] , placed order with [employer] . Will call patient when order comes in .,

## 2022-09-07 ENCOUNTER — Other Ambulatory Visit: Payer: 59

## 2022-09-13 ENCOUNTER — Telehealth: Payer: Self-pay

## 2022-09-13 NOTE — Telephone Encounter (Signed)
Pt arrived to the office requesting an appointment. Pt is short of breath and has to stop and get her breath when ambulating. Pt states she has chest tightness. Advised to go directly to the ED for evaluation. Pt states if you say so then ok. Advised that her symptoms are very concerning. Pt is requesting a RX for prophonone. Pt states that the Metoprolol does not work for her. Advised that Dr. Nelwyn Salisbury will have to approve the medication.

## 2022-09-18 ENCOUNTER — Other Ambulatory Visit: Payer: Self-pay | Admitting: Cardiology

## 2022-09-20 ENCOUNTER — Ambulatory Visit (INDEPENDENT_AMBULATORY_CARE_PROVIDER_SITE_OTHER): Payer: 59 | Admitting: Podiatry

## 2022-09-20 DIAGNOSIS — M216X1 Other acquired deformities of right foot: Secondary | ICD-10-CM | POA: Diagnosis not present

## 2022-09-20 DIAGNOSIS — M2141 Flat foot [pes planus] (acquired), right foot: Secondary | ICD-10-CM | POA: Diagnosis not present

## 2022-09-20 DIAGNOSIS — M19071 Primary osteoarthritis, right ankle and foot: Secondary | ICD-10-CM | POA: Diagnosis not present

## 2022-09-20 DIAGNOSIS — M2142 Flat foot [pes planus] (acquired), left foot: Secondary | ICD-10-CM

## 2022-09-20 DIAGNOSIS — M19072 Primary osteoarthritis, left ankle and foot: Secondary | ICD-10-CM

## 2022-09-20 DIAGNOSIS — E1142 Type 2 diabetes mellitus with diabetic polyneuropathy: Secondary | ICD-10-CM | POA: Diagnosis not present

## 2022-09-20 NOTE — Progress Notes (Signed)
Patient presents today to pick up diabetic shoes and insoles.  Patient was dispensed 1 pair of diabetic shoes and 3 pairs of foam casted diabetic insoles. Fit was satisfactory. Instructions for break-in and wear was reviewed and a copy was given to the patient.   Re-appointment for regularly scheduled diabetic foot care visits or if they should experience any trouble with the shoes or insoles.  

## 2022-09-22 DIAGNOSIS — R0602 Shortness of breath: Secondary | ICD-10-CM | POA: Diagnosis not present

## 2022-10-12 ENCOUNTER — Encounter: Payer: Self-pay | Admitting: Internal Medicine

## 2022-10-21 ENCOUNTER — Ambulatory Visit: Payer: 59 | Attending: Cardiology | Admitting: Cardiology

## 2022-10-21 NOTE — Progress Notes (Deleted)
  Cardiology Office Note:  .   Date:  10/21/2022  ID:  [name], DOB 1965-03-23, MRN [id number] PCP: Simone Curia, MD  [facility] Providers Cardiologist:  None { Click to update primary MD,subspecialty MD or APP then REFRESH:1}   History of Present Illness: .   Tonya Quinn is a 57 y.o. female with a past medical history of hypertension, atrial fibrillation, SVT, DM 2, coronary artery calcifications, GERD, hypothyroidism, dyslipidemia, fibromyalgia.  09/22/2022 echo EF 65 to 70%, borderline concentric LVH, impaired relaxation, trivial MR 09/15/2021 Lexiscan normal, low risk study 08/30/2021 monitor heart rate 69 to 184 bpm, average 90 bpm, predominant underlying rhythm was sinus, 2 episodes of SVT, occasional SVE's 3%, 1 run of NSVT.  Evaluated by Dr. Tomie China on 06/23/2022, she was started on Xarelto for her PAF, advised to follow-up in 6 months sooner if needed.  Evaluated in [facility] ED on 10/04/2022 with complaints of shortness of breath and chest pressure for the previous week, she had recently had URI type symptoms as well.  Cardiac workup was unrevealing.  10/07/2022 creatinine 1.0, GFR 66, potassium 3.6, normal LFTs, alk phos slightly elevated at 126 magnesium 2.0, CBC 12.2 39.7, TSH 0.71 08/22/2022 LDL 115   ROS: ***  Studies Reviewed: .        *** Risk Assessment/Calculations:   {Does this patient have ATRIAL FIBRILLATION?:[phone]} No BP recorded.  {Refresh Note OR Click here to enter BP  :1}***       Physical Exam:   VS:  LMP 02/16/2012    Wt Readings from Last 3 Encounters:  06/23/22 (!) 305 lb 12.8 oz (138.7 kg)  10/24/21 300 lb (136.1 kg)  09/15/21 300 lb (136.1 kg)    GEN: Well nourished, well developed in no acute distress NECK: No JVD; No carotid bruits CARDIAC: ***RRR, no murmurs, rubs, gallops RESPIRATORY:  Clear to auscultation without rales, wheezing or rhonchi  ABDOMEN: Soft, non-tender, non-distended EXTREMITIES:  No edema; No  deformity   ASSESSMENT AND PLAN: .   ***    {Are you ordering a CV Procedure (e.g. stress test, cath, DCCV, TEE, etc)?   Press F2        :[id number]}  Dispo: ***  Signed, Flossie Dibble, NP

## 2022-11-25 ENCOUNTER — Ambulatory Visit: Payer: 59 | Attending: Cardiology | Admitting: Cardiology

## 2022-11-25 ENCOUNTER — Encounter: Payer: Self-pay | Admitting: Cardiology

## 2022-11-25 VITALS — BP 138/88 | HR 92 | Ht 66.0 in | Wt 312.8 lb

## 2022-11-25 DIAGNOSIS — I2510 Atherosclerotic heart disease of native coronary artery without angina pectoris: Secondary | ICD-10-CM

## 2022-11-25 DIAGNOSIS — E669 Obesity, unspecified: Secondary | ICD-10-CM

## 2022-11-25 DIAGNOSIS — Z7984 Long term (current) use of oral hypoglycemic drugs: Secondary | ICD-10-CM

## 2022-11-25 DIAGNOSIS — E1169 Type 2 diabetes mellitus with other specified complication: Secondary | ICD-10-CM

## 2022-11-25 DIAGNOSIS — I10 Essential (primary) hypertension: Secondary | ICD-10-CM

## 2022-11-25 DIAGNOSIS — I2584 Coronary atherosclerosis due to calcified coronary lesion: Secondary | ICD-10-CM

## 2022-11-25 DIAGNOSIS — I480 Paroxysmal atrial fibrillation: Secondary | ICD-10-CM

## 2022-11-25 DIAGNOSIS — E119 Type 2 diabetes mellitus without complications: Secondary | ICD-10-CM

## 2022-11-25 MED ORDER — DABIGATRAN ETEXILATE MESYLATE 150 MG PO CAPS: 150.0000 mg | ORAL_CAPSULE | Freq: Two times a day (BID) | ORAL | 3 refills | Status: DC

## 2022-11-25 NOTE — Patient Instructions (Signed)
Medication Instructions:  Your physician has recommended you make the following change in your medication:   START: Pradaxa 150 mg twice daily  *If you need a refill on your cardiac medications before your next appointment, please call your pharmacy*   Lab Work: None If you have labs (blood work) drawn today and your tests are completely normal, you will receive your results only by: MyChart Message (if you have MyChart) OR A paper copy in the mail If you have any lab test that is abnormal or we need to change your treatment, we will call you to review the results.   Testing/Procedures: None   Follow-Up: At [facility], you and your health needs are our priority.  As part of our continuing mission to provide you with exceptional heart care, we have created designated Provider Care Teams.  These Care Teams include your primary Cardiologist (physician) and Advanced Practice Providers (APPs -  Physician Assistants and Nurse Practitioners) who all work together to provide you with the care you need, when you need it.  We recommend signing up for the patient portal called "MyChart".  Sign up information is provided on this After Visit Summary.  MyChart is used to connect with patients for Virtual Visits (Telemedicine).  Patients are able to view lab/test results, encounter notes, upcoming appointments, etc.  Non-urgent messages can be sent to your provider as well.   To learn more about what you can do with MyChart, go to [email or web address].    Your next appointment:   9 month(s)  Provider:   Belva Crome, MD    Other Instructions None

## 2022-11-25 NOTE — Progress Notes (Signed)
Cardiology Office Note:    Date:  11/25/2022   ID:  Tonya Quinn, DOB 22-Mar-1965, MRN [id number]  PCP:  Simone Curia, MD  Cardiologist:  Garwin Brothers, MD   Referring MD: Simone Curia, MD    ASSESSMENT:    1. Coronary artery calcification   2. PAF (paroxysmal atrial fibrillation) (HCC)   3. Benign essential hypertension   4. Diabetes mellitus without complication (HCC)   5. Type 2 diabetes mellitus with obesity (HCC)   6. Morbid obesity (HCC)    PLAN:    In order of problems listed above:  Coronary artery calcification: Secondary prevention stressed with the patient.  Importance of compliance with diet medication stressed and she vocalized understanding.  She was advised to walk to the best of her ability. Essential hypertension: Blood pressure is stable and diet was emphasized.  Lifestyle education urged. Paroxysmal atrial fibrillation:I discussed with the patient atrial fibrillation, disease process. Management and therapy including rate and rhythm control, anticoagulation benefits and potential risks were discussed extensively with the patient. Patient had multiple questions which were answered to patient's satisfaction.  Lengthy discussion with her about anticoagulation.  She is willing to do Pradaxa.  She does not want to use Xarelto and Eliquis.  She feels that her blood is too thin and she can "feel the blood running within me". Mixed dyslipidemia: I discussed statin therapy with the patient.  Benefits and potential risks explained.  She is not keen on it.  She wants to think about it and get back to me if she changes her mind. Diabetes melitis and morbid obesity: Weight reduction stressed diet emphasized and patient promises to do better. Patient will be seen in follow-up appointment in 6 months or earlier if the patient has any concerns.   Medication Adjustments/Labs and Tests Ordered: Current medicines are reviewed at length with the patient today.  Concerns regarding  medicines are outlined above.  Orders Placed This Encounter  Procedures   EKG 12-Lead   No orders of the defined types were placed in this encounter.    No chief complaint on file.    History of Present Illness:    Tonya Quinn is a 57 y.o. female.  Patient has past medical history of coronary artery calcification, mixed dyslipidemia, diabetes mellitus, paroxysmal atrial fibrillation and morbid obesity.  She denies any problems at this time and takes care of activities of daily living.  No chest pain orthopnea or PND.  She stopped her anticoagulation because she tells me that she does not "like it".  She is not very compliant with therapy and does not get in touch with [place] about stopping this medications.  At the time of my evaluation, the patient is alert awake oriented and in no distress.  Past Medical History:  Diagnosis Date   Acute asthma exacerbation 10/24/2021   Adult BMI 45.0-49.9 kg/sq m [facility])    AKI (acute kidney injury) (HCC) 10/25/2021   ALLERGIC RHINITIS 07/05/2006   Qualifier: Diagnosis of  By: Tressia Danas  MD, Adlih     Anxiety 04/07/2015   Asthma    Atrial fibrillation (HCC)    Atrial flutter (HCC) 01/02/2015   BACK PAIN 07/05/2006   Qualifier: Diagnosis of  By: Tressia Danas  MD, Adlih     Benign essential hypertension    Bronchitis 11/24/2014   Cervical spine pain 07/11/2019   Chest pain of uncertain etiology 08/30/2021   Chronic fatigue 08/30/2021   Chronic midline thoracic back pain 07/11/2019  Common cold 11/04/2014   Constipation by delayed colonic transit 09/10/2014   Current use of insulin (HCC) 10/16/2018   DDD (degenerative disc disease), lumbar    Degeneration of intervertebral disc of lumbar region 07/13/2015   Degenerative disc disease, 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   Diabetes mellitus without complication (HCC) 04/07/2015   Diabetic neuropathy associated with type 2 diabetes mellitus (HCC) 09/12/2017   Diabetic polyneuropathy associated with type 2  diabetes mellitus (HCC) 10/23/2018   Dyspnea, unspecified 01/02/2015   Dysuria 07/05/2006   Qualifier: Diagnosis of  By: Tomasa Rand     Elevated lactic acid level 01/02/2015   Essential hypertension    Excessive sleepiness 11/12/2013   Fibroids 03/01/2012   Fibromyalgia 11/12/2013   Generalized anxiety disorder 09/19/2014   Generalized osteoarthritis of multiple sites    GERD (gastroesophageal reflux disease)    Heart palpitations 01/02/2015   HLD (hyperlipidemia)    Hot flash, menopausal 09/24/2015   Formatting of this note might be different from the original. Pt with multiple complaints that have been ongoing since prior to her hyst.  LAVH/BSO in 2013 at [facility].  C/o bloating, breast tenderness, mood changes, pelvic pain. Check labs as below to eval for menopause vs ? Ovarian remnant.  Imaging reviewed as far back as 2015. Benign.  RTC 4wks for follow up. Again discussed that her symptoms a   Hyperglycemia 01/02/2015   Hyperlipidemia associated with type 2 diabetes mellitus (HCC) 12/27/2018   Hyperopia of both eyes with astigmatism and presbyopia 10/16/2018   Hypertension    Hypertension associated with type 2 diabetes mellitus (HCC) 07/13/2015   Hypokalemia    Hypomagnesemia    Hypothyroidism    Left-sided weakness 04/07/2015   Long term current use of oral hypoglycemic drug 10/16/2018   LOW BACK PAIN, CHRONIC 07/05/2006   Qualifier: Diagnosis of  By: Tressia Danas  MD, Adlih     Lower extremity edema    Major depressive disorder, recurrent episode, moderate (HCC) 11/27/2012   Microalbuminuria due to type 2 diabetes mellitus (HCC) 07/14/2018   Morbid obesity (HCC)    Nuclear sclerotic cataract of both eyes 10/16/2018   Obesity    PAF (paroxysmal atrial fibrillation) (HCC) 08/30/2021   PMS (premenstrual syndrome) 12/28/2017   Polyarthralgia 08/05/2021   Postmenopausal bleeding 12/28/2017   Sepsis (HCC) 01/02/2015   Sleep apnea    Status post hysterectomy 09/30/2019    SVT (supraventricular tachycardia) 01/02/2015   Type 2 diabetes mellitus with diabetic neuropathy, with long-term current use of insulin (HCC) 12/27/2018   Type 2 diabetes mellitus with obesity (HCC)    Uncontrolled type 2 diabetes mellitus with hyperglycemia (HCC) 04/07/2015   Formatting of this note might be different from the original. Uncontrolled. A1c 7/17- 10.9   Uninsured medical expenses 09/10/2014   Vaginal dryness, menopausal 07/13/2015   Formatting of this note might be different from the original. Some atrophy present. She desires oral estrogen, but will not rx given her multiple comorbidities. Start vaginal premarin RTC 3 months for f/u.   Vitreous floater, bilateral 10/16/2018   Weakness 04/08/2015    Past Surgical History:  Procedure Laterality Date   ABDOMINAL HYSTERECTOMY     CYSTOSCOPY  02/29/2012   Procedure: CYSTOSCOPY;  Surgeon: Lavina Hamman, MD;  Location: [facility];  Service: Gynecology;  Laterality: N/A;   LAPAROSCOPIC ASSISTED VAGINAL HYSTERECTOMY  02/29/2012   Procedure: LAPAROSCOPIC ASSISTED VAGINAL HYSTERECTOMY;  Surgeon: Lavina Hamman, MD;  Location: [facility];  Service: Gynecology;  Laterality: N/A;   SALPINGOOPHORECTOMY  02/29/2012  Procedure: SALPINGO OOPHORECTOMY;  Surgeon: Lavina Hamman, MD;  Location: [facility];  Service: Gynecology;  Laterality: Bilateral;   uterine ablation     VEIN SURGERY      Current Medications: Current Meds  Medication Sig   acetaminophen (TYLENOL) 500 MG tablet Take 500 mg by mouth daily as needed (pain).   ALPRAZolam (XANAX) 0.25 MG tablet Take 0.25 mg by mouth at bedtime as needed for anxiety.   aspirin EC 81 MG tablet Take 81 mg by mouth daily. Swallow whole.   Cholecalciferol (VITAMIN D3 PO) Take 1 capsule by mouth 3 (three) times a week.   Cyanocobalamin (VITAMIN B-12 PO) Take 1 capsule by mouth 3 (three) times a week.   cyanocobalamin (VITAMIN B12) 1000 MCG tablet Take 1,000 mcg by mouth daily.   diclofenac Sodium  (VOLTAREN) 1 % GEL Apply 1 Application topically as needed (pain).   furosemide (LASIX) 20 MG tablet Take 20 mg by mouth daily as needed for fluid or edema.   JARDIANCE 25 MG TABS tablet Take 25 mg by mouth daily.   linaclotide (LINZESS) 145 MCG CAPS capsule Take 145 mcg by mouth daily before breakfast.   linaclotide (LINZESS) 72 MCG capsule Take 72 mcg by mouth daily as needed (constipation).   losartan (COZAAR) 100 MG tablet Take 100 mg by mouth daily.   montelukast (SINGULAIR) 10 MG tablet Take 10 mg by mouth at bedtime.   pantoprazole (PROTONIX) 40 MG tablet Take 40 mg by mouth daily as needed (acid reflux).   Polyethylene Glycol 3350 (MIRALAX PO) Take 17 g by mouth 2 (two) times daily as needed (constipation).   progesterone (PROMETRIUM) 200 MG capsule Take 200 mg by mouth daily.   pyridOXINE (VITAMIN B6) 100 MG tablet Take 100 mg by mouth daily.   tirzepatide (MOUNJARO) 7.5 MG/0.5ML Pen Inject 7.5 mg into the skin once a week.   traMADol (ULTRAM) 50 MG tablet Take 50 mg by mouth every 6 (six) hours as needed.     Allergies:   Hydrochlorothiazide, Topiramate, Insulin lispro, Fluoxetine, Lisinopril, Sertraline, Insulin glargine, and Metformin and related   Social History   Socioeconomic History   Marital status: Widowed    Spouse name: Not on file   Number of children: Not on file   Years of education: Not on file   Highest education level: Not on file  Occupational History   Not on file  Tobacco Use   Smoking status: Never   Smokeless tobacco: Never  Vaping Use   Vaping status: Never Used  Substance and Sexual Activity   Alcohol use: No   Drug use: No   Sexual activity: Not on file  Other Topics Concern   Not on file  Social History Narrative   Not on file   Social Determinants of Health   Financial Resource Strain: Low Risk  (10/19/2022)   Received from [facility], [facility]   Overall Financial Resource Strain (CARDIA)    Difficulty of Paying Living Expenses:  Not hard at all  Food Insecurity: No Food Insecurity (10/19/2022)   Received from [facility], [facility]   Hunger Vital Sign    Worried About Running Out of Food in the Last Year: Never true    Ran Out of Food in the Last Year: Never true  Transportation Needs: Unknown (10/19/2022)   Received from [employer], [facility]   PRAPARE - Transportation    Lack of Transportation (Medical): No    Lack of Transportation (Non-Medical): Not on  file  Physical Activity: Unknown (10/19/2022)   Received from [facility], [facility]   Exercise Vital Sign    Days of Exercise per Week: 0 days    Minutes of Exercise per Session: Not on file  Stress: Stress Concern Present (10/19/2022)   Received from [facility], [facility] - Occupational Stress Questionnaire    Feeling of Stress : To some extent  Social Connections: Socially Isolated (10/19/2022)   Received from [facility], [facility]   Social Network    How would you rate your social network (family, work, friends)?: Little participation, lonely and socially isolated     Family History: The patient's family history includes Diabetes in her brother, maternal grandmother, and paternal grandmother; Heart disease in her maternal grandmother; Hypertension in her brother, maternal grandmother, and paternal grandmother.  ROS:   Please see the history of present illness.    All other systems reviewed and are negative.  EKGs/Labs/Other Studies Reviewed:    The following studies were reviewed today: .Marland KitchenEKG Interpretation Date/Time:  Friday November 25 2022 10:19:08 EDT Ventricular Rate:  92 PR Interval:  158 QRS Duration:  72 QT Interval:  366 QTC Calculation: 452 R Axis:   8  Text Interpretation: Normal sinus rhythm Inferior infarct , age undetermined Abnormal ECG When compared with ECG of 24-Oct-2021 16:40, Premature atrial complexes are no longer Present Borderline criteria for  Anterior infarct are no longer Present Confirmed by Belva Crome [id number]) on 11/25/2022 10:26:47 AM     Recent Labs: No results found for requested labs within last 365 days.  Recent Lipid Panel    Component Value Date/Time   CHOL 216 (H) 04/07/2015 0125   TRIG 139 04/07/2015 0125   HDL 56 04/07/2015 0125   CHOLHDL 3.9 04/07/2015 0125   VLDL 28 04/07/2015 0125   LDLCALC 132 (H) 04/07/2015 0125    Physical Exam:    VS:  BP 138/88 (BP Location: Right Arm, Patient Position: Sitting, Cuff Size: Normal)   Pulse 92   Ht 5\' 6"  (1.676 m)   Wt (!) 312 lb 12.8 oz (141.9 kg)   LMP 02/16/2012   SpO2 90%   BMI 50.49 kg/m     Wt Readings from Last 3 Encounters:  11/25/22 (!) 312 lb 12.8 oz (141.9 kg)  06/23/22 (!) 305 lb 12.8 oz (138.7 kg)  10/24/21 300 lb (136.1 kg)     GEN: Patient is in no acute distress HEENT: Normal NECK: No JVD; No carotid bruits LYMPHATICS: No lymphadenopathy CARDIAC: Hear sounds regular, 2/6 systolic murmur at the apex. RESPIRATORY:  Clear to auscultation without rales, wheezing or rhonchi  ABDOMEN: Soft, non-tender, non-distended MUSCULOSKELETAL:  No edema; No deformity  SKIN: Warm and dry NEUROLOGIC:  Alert and oriented x 3 PSYCHIATRIC:  Normal affect   Signed, Garwin Brothers, MD  11/25/2022 10:40 AM     [facility]

## 2023-02-13 ENCOUNTER — Ambulatory Visit: Payer: 59 | Admitting: Podiatry

## 2023-07-13 ENCOUNTER — Ambulatory Visit: Attending: Cardiology | Admitting: Cardiology

## 2023-07-13 VITALS — BP 106/68 | HR 80 | Ht 66.0 in | Wt 304.0 lb

## 2023-07-13 DIAGNOSIS — I152 Hypertension secondary to endocrine disorders: Secondary | ICD-10-CM

## 2023-07-13 DIAGNOSIS — I10 Essential (primary) hypertension: Secondary | ICD-10-CM

## 2023-07-13 DIAGNOSIS — I480 Paroxysmal atrial fibrillation: Secondary | ICD-10-CM | POA: Diagnosis not present

## 2023-07-13 DIAGNOSIS — E1159 Type 2 diabetes mellitus with other circulatory complications: Secondary | ICD-10-CM

## 2023-07-13 DIAGNOSIS — E782 Mixed hyperlipidemia: Secondary | ICD-10-CM

## 2023-07-13 DIAGNOSIS — E1165 Type 2 diabetes mellitus with hyperglycemia: Secondary | ICD-10-CM

## 2023-07-13 DIAGNOSIS — I2510 Atherosclerotic heart disease of native coronary artery without angina pectoris: Secondary | ICD-10-CM | POA: Diagnosis not present

## 2023-07-13 DIAGNOSIS — R079 Chest pain, unspecified: Secondary | ICD-10-CM

## 2023-07-13 NOTE — Patient Instructions (Addendum)
 Medication Instructions:  Your physician recommends that you continue on your current medications as directed. Please refer to the Current Medication list given to you today.  *If you need a refill on your cardiac medications before your next appointment, please call your pharmacy*   Lab Work: None ordered If you have labs (blood work) drawn today and your tests are completely normal, you will receive your results only by: MyChart Message (if you have MyChart) OR A paper copy in the mail If you have any lab test that is abnormal or we need to change your treatment, we will call you to review the results.   Testing/Procedures: You are scheduled for a Myocardial Perfusion Imaging Study.  Please arrive 15 minutes prior to your appointment time for registration and insurance purposes.  The test will be done over 2 days and take approximately 3 to 4 hours each day to complete; you may bring reading material.  If someone comes with you to your appointment, they will need to remain in the main lobby due to limited space in the testing area.   How to prepare for your Myocardial Perfusion Test: Do not eat or drink 3 hours prior to your test, except you may have water . Do not consume products containing caffeine (regular or decaffeinated) 12 hours prior to your test. (ex: coffee, chocolate, sodas, tea). Do bring a list of your current medications with you.  If not listed below, you may take your medications as normal. Do wear comfortable clothes (no dresses or overalls) and walking shoes, tennis shoes preferred (No heels or open toe shoes are allowed). Do NOT wear cologne, perfume, aftershave, or lotions (deodorant is allowed). If these instructions are not followed, your test will have to be rescheduled.  If you cannot keep your appointment, please provide 24 hours notification to the Nuclear Lab, to avoid a possible $50 charge to your account.  Follow-Up: At [facility], you and your  health needs are our priority.  As part of our continuing mission to provide you with exceptional heart care, we have created designated Provider Care Teams.  These Care Teams include your primary Cardiologist (physician) and Advanced Practice Providers (APPs -  Physician Assistants and Nurse Practitioners) who all work together to provide you with the care you need, when you need it.  We recommend signing up for the patient portal called "MyChart".  Sign up information is provided on this After Visit Summary.  MyChart is used to connect with patients for Virtual Visits (Telemedicine).  Patients are able to view lab/test results, encounter notes, upcoming appointments, etc.  Non-urgent messages can be sent to your provider as well.   To learn more about what you can do with MyChart, go to [email or web address].    Your next appointment:   12 month(s)  Provider:   Hillis Lu, MD   Other Instructions  Cardiac Nuclear Scan A cardiac nuclear scan is a test that is done to check the flow of blood to your heart. It is done when you are resting and when you are exercising. The test looks for problems such as: Not enough blood reaching a portion of the heart. The heart muscle not working as it should. You may need this test if you have: Heart disease. Lab results that are not normal. Had heart surgery or a balloon procedure to open up blocked arteries (angioplasty) or a small mesh tube (stent). Chest pain. Shortness of breath. Had a heart attack. In this test, a special  dye (tracer) is put into your bloodstream. The tracer will travel to your heart. A camera will then take pictures of your heart to see how the tracer moves through your heart. This test is usually done at a hospital and takes 2-4 hours. Tell a doctor about: Any allergies you have. All medicines you are taking, including vitamins, herbs, eye drops, creams, and over-the-counter medicines. Any bleeding problems you have. Any  surgeries you have had. Any medical conditions you have. Whether you are pregnant or may be pregnant. Any history of asthma or long-term (chronic) lung disease. Any history of heart rhythm disorders or heart valve conditions. What are the risks? Your doctor will talk with you about risks. These may include: Serious chest pain and heart attack. This is only a risk if the stress portion of the test is done. Fast or uneven heartbeats (palpitations). A feeling of warmth in your chest. This feeling usually does not last long. Allergic reaction to the tracer. Shortness of breath or trouble breathing. What happens before the test? Ask your doctor about changing or stopping your normal medicines. Follow instructions from your doctor about what you cannot eat or drink. Remove your jewelry on the day of the test. Ask your doctor if you need to avoid nicotine or caffeine. What happens during the test? An IV tube will be inserted into one of your veins. Your doctor will give you a small amount of tracer through the IV tube. You will wait for 20-40 minutes while the tracer moves through your bloodstream. Your heart will be monitored with an electrocardiogram (ECG). You will lie down on an exam table. Pictures of your heart will be taken for about 15-20 minutes. You may also have a stress test. For this test, one of these things may be done: You will be asked to exercise on a treadmill or a stationary bike. You will be given medicines that will make your heart work harder. This is done if you are unable to exercise. When blood flow to your heart has peaked, a tracer will again be given through the IV tube. After 20-40 minutes, you will get back on the exam table. More pictures will be taken of your heart. Depending on the tracer that is used, more pictures may need to be taken 3-4 hours later. Your IV tube will be removed when the test is over. The test may vary among doctors and hospitals. What  happens after the test? Ask your doctor: Whether you can return to your normal schedule, including diet, activities, travel, and medicines. Whether you should drink more fluids. This will help to remove the tracer from your body. Ask your doctor, or the department that is doing the test: When will my results be ready? How will I get my results? What are my treatment options? What other tests do I need? What are my next steps? This information is not intended to replace advice given to you by your health care provider. Make sure you discuss any questions you have with your health care provider. Document Revised: 07/27/2021 Document Reviewed: 07/27/2021 Elsevier Patient Education  2023 [employer].

## 2023-07-13 NOTE — Progress Notes (Signed)
 Cardiology Office Note:    Date:  07/13/2023   ID:  Tonya Quinn, DOB 1966-01-11, MRN [id number]  PCP:  Tonya Barer, MD  Cardiologist:  Tonya Balzarine, MD   Referring MD: Tonya Barer, MD    ASSESSMENT:    1. PAF (paroxysmal atrial fibrillation) (HCC)   2. Hypertension associated with type 2 diabetes mellitus (HCC)   3. Essential hypertension   4. Coronary artery calcification   5. Uncontrolled type 2 diabetes mellitus with hyperglycemia (HCC)   6. Morbid obesity (HCC)   7. Chest pain, unspecified type   8. Mixed hyperlipidemia    PLAN:    In order of problems listed above:  Coronary artery calcification: Chest pain: Atypical in nature but in view of risk factors we will set her up for a Lexiscan  sestamibi and she is agreeable.  Sublingual nitroglycerin prescription was sent, its protocol and 911 protocol explained and the patient vocalized understanding questions were answered to the patient's satisfaction Essential hypertension: Blood pressure stable and diet was emphasized. Mixed dyslipidemia and diabetes mellitus: Diet emphasized.  Diabetes is not under control and she understands the importance of getting this better. Morbid obesity: Weight reduction stressed diet emphasized and she promises to do better. Patient will be seen in follow-up appointment in 6 months or earlier if the patient has any concerns.    Medication Adjustments/Labs and Tests Ordered: Current medicines are reviewed at length with the patient today.  Concerns regarding medicines are outlined above.  No orders of the defined types were placed in this encounter.  No orders of the defined types were placed in this encounter.    No chief complaint on file.    History of Present Illness:    Tonya Quinn is a 58 y.o. female.  Patient has past medical history of coronary artery calcification, essential hypertension, dyslipidemia, uncontrolled diabetes mellitus and morbid obesity.  She leads a  sedentary lifestyle.  She denies any chest pain orthopnea or PND however yesterday she had an episode of chest discomfort and went to the emergency room at [facility].  She denies at this point any problem.  These have resolved.  Again she leads a sedentary lifestyle.  Past Medical History:  Diagnosis Date   Acute asthma exacerbation 10/24/2021   Adult BMI 45.0-49.9 kg/sq m [facility])    AKI (acute kidney injury) (HCC) 10/25/2021   ALLERGIC RHINITIS 07/05/2006   Qualifier: Diagnosis of  By: Beverlee Bucco  MD, Adlih     Allergic rhinitis 07/05/2006   Qualifier: Diagnosis of   By: Beverlee Bucco  MD, Adlih      IMO SNOMED Dx Update Oct 2024     Anxiety 04/07/2015   Asthma    Atrial fibrillation (HCC)    Atrial flutter (HCC) 01/02/2015   BACK PAIN 07/05/2006   Qualifier: Diagnosis of  By: Beverlee Bucco  MD, Adlih     Backache 07/05/2006   Qualifier: Diagnosis of   By: Beverlee Bucco  MD, Adlih      IMO SNOMED Dx Update Oct 2024     Benign essential hypertension    Bronchitis 11/24/2014   Cervical spine pain 07/11/2019   Chest pain of uncertain etiology 08/30/2021   Chronic fatigue 08/30/2021   Chronic midline thoracic back pain 07/11/2019   Common cold 11/04/2014   Constipation by delayed colonic transit 09/10/2014   Coronary artery calcification 06/23/2022   Current use of insulin  (HCC) 10/16/2018   DDD (degenerative disc disease), lumbar    Degeneration of intervertebral disc  of lumbar region 07/13/2015   Degenerative disc disease, 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   Diabetes mellitus without complication (HCC) 04/07/2015   Diabetic neuropathy associated with type 2 diabetes mellitus (HCC) 09/12/2017   Diabetic polyneuropathy associated with type 2 diabetes mellitus (HCC) 10/23/2018   Dyspnea, unspecified 01/02/2015   Dysuria 07/05/2006   Qualifier: Diagnosis of  By: Alean Amen     Elevated lactic acid level 01/02/2015   Essential hypertension    Excessive sleepiness 11/12/2013   Fibroids 03/01/2012    Fibromyalgia 11/12/2013   Generalized anxiety disorder 09/19/2014   Generalized osteoarthritis of multiple sites    GERD (gastroesophageal reflux disease)    Heart palpitations 01/02/2015   HLD (hyperlipidemia)    Hot flash, menopausal 09/24/2015   Formatting of this note might be different from the original. Pt with multiple complaints that have been ongoing since prior to her hyst.  LAVH/BSO in 2013 at [facility].  C/o bloating, breast tenderness, mood changes, pelvic pain. Check labs as below to eval for menopause vs ? Ovarian remnant.  Imaging reviewed as far back as 2015. Benign.  RTC 4wks for follow up. Again discussed that her symptoms a   Hyperglycemia 01/02/2015   Hyperlipidemia associated with type 2 diabetes mellitus (HCC) 12/27/2018   Hyperopia of both eyes with astigmatism and presbyopia 10/16/2018   Hypertension    Hypertension associated with type 2 diabetes mellitus (HCC) 07/13/2015   Hypokalemia    Hypomagnesemia    Hypothyroidism    Left-sided weakness 04/07/2015   Long term current use of oral hypoglycemic drug 10/16/2018   LOW BACK PAIN, CHRONIC 07/05/2006   Qualifier: Diagnosis of  By: Beverlee Bucco  MD, Adlih     Lower extremity edema    Major depressive disorder, recurrent episode, moderate (HCC) 11/27/2012   Microalbuminuria due to type 2 diabetes mellitus (HCC) 07/14/2018   Morbid obesity (HCC)    Nuclear sclerotic cataract of both eyes 10/16/2018   Obesity    PAF (paroxysmal atrial fibrillation) (HCC) 08/30/2021   PMS (premenstrual syndrome) 12/28/2017   Polyarthralgia 08/05/2021   Postmenopausal bleeding 12/28/2017   Sepsis (HCC) 01/02/2015   Sleep apnea    Status post hysterectomy 09/30/2019   SVT (supraventricular tachycardia) (HCC) 01/02/2015   Type 2 diabetes mellitus with diabetic neuropathy, with long-term current use of insulin  (HCC) 12/27/2018   Type 2 diabetes mellitus with obesity (HCC)    Uncontrolled type 2 diabetes mellitus with hyperglycemia  (HCC) 04/07/2015   Formatting of this note might be different from the original. Uncontrolled. A1c 7/17- 10.9   Uninsured medical expenses 09/10/2014   Vaginal dryness, menopausal 07/13/2015   Formatting of this note might be different from the original. Some atrophy present. She desires oral estrogen, but will not rx given her multiple comorbidities. Start vaginal premarin RTC 3 months for f/u.   Vitreous floater, bilateral 10/16/2018   Weakness 04/08/2015    Past Surgical History:  Procedure Laterality Date   ABDOMINAL HYSTERECTOMY     CYSTOSCOPY  02/29/2012   Procedure: CYSTOSCOPY;  Surgeon: Cyd Dowse, MD;  Location: [facility];  Service: Gynecology;  Laterality: N/A;   LAPAROSCOPIC ASSISTED VAGINAL HYSTERECTOMY  02/29/2012   Procedure: LAPAROSCOPIC ASSISTED VAGINAL HYSTERECTOMY;  Surgeon: Cyd Dowse, MD;  Location: [facility];  Service: Gynecology;  Laterality: N/A;   SALPINGOOPHORECTOMY  02/29/2012   Procedure: SALPINGO OOPHORECTOMY;  Surgeon: Cyd Dowse, MD;  Location: [facility];  Service: Gynecology;  Laterality: Bilateral;   uterine ablation     VEIN SURGERY  Current Medications: Current Meds  Medication Sig   acetaminophen  (TYLENOL ) 500 MG tablet Take 500 mg by mouth daily as needed (pain).   ALPRAZolam  (XANAX ) 0.25 MG tablet Take 0.25 mg by mouth at bedtime as needed for anxiety.   amLODipine (NORVASC) 10 MG tablet Take 5 tablets by mouth daily.   celecoxib (CELEBREX) 200 MG capsule Take 200 mg by mouth daily.   Cholecalciferol  (VITAMIN D3 PO) Take 1 capsule by mouth 3 (three) times a week.   Cyanocobalamin (VITAMIN B-12 PO) Take 1 capsule by mouth 3 (three) times a week.   diclofenac  Sodium (VOLTAREN ) 1 % GEL Apply 1 Application topically as needed (pain).   JARDIANCE  25 MG TABS tablet Take 25 mg by mouth daily.   losartan  (COZAAR ) 100 MG tablet Take 100 mg by mouth daily.   nebivolol (BYSTOLIC) 10 MG tablet Take 1 tablet by mouth daily.   pantoprazole (PROTONIX)  40 MG tablet Take 40 mg by mouth daily as needed (acid reflux).   Polyethylene Glycol 3350 (MIRALAX PO) Take 17 g by mouth 2 (two) times daily as needed (constipation).   TOUJEO MAX SOLOSTAR 300 UNIT/ML Solostar Pen Inject 35 Units into the skin daily.     Allergies:   Hydrochlorothiazide, Topiramate, Insulin  lispro, Fluoxetine, Lisinopril , Sertraline, Insulin  glargine, and Metformin  and related   Social History   Socioeconomic History   Marital status: Widowed    Spouse name: Not on file   Number of children: Not on file   Years of education: Not on file   Highest education level: Not on file  Occupational History   Not on file  Tobacco Use   Smoking status: Never   Smokeless tobacco: Never  Vaping Use   Vaping status: Never Used  Substance and Sexual Activity   Alcohol use: No   Drug use: No   Sexual activity: Not on file  Other Topics Concern   Not on file  Social History Narrative   Not on file   Social Drivers of Health   Financial Resource Strain: Low Risk  (07/10/2023)   Received from [facility]   Overall Financial Resource Strain (CARDIA)    Difficulty of Paying Living Expenses: Not very hard  Food Insecurity: No Food Insecurity (07/10/2023)   Received from [facility]   Hunger Vital Sign    Worried About Running Out of Food in the Last Year: Never true    Ran Out of Food in the Last Year: Never true  Transportation Needs: No Transportation Needs (07/10/2023)   Received from [facility] - Transportation    Lack of Transportation (Medical): No    Lack of Transportation (Non-Medical): No  Physical Activity: Unknown (07/10/2023)   Received from [facility]   Exercise Vital Sign    Days of Exercise per Week: 0 days    Minutes of Exercise per Session: Not on file  Stress: No Stress Concern Present (07/10/2023)   Received from [facility] - Occupational Stress Questionnaire    Feeling of Stress : Only  a little  Social Connections: Socially Integrated (07/10/2023)   Received from [facility]   Social Network    How would you rate your social network (family, work, friends)?: Good participation with social networks     Family History: The patient's family history includes Diabetes in her brother, maternal grandmother, and paternal grandmother; Heart disease in her maternal grandmother; Hypertension in her brother, maternal grandmother, and paternal  grandmother.  ROS:   Please see the history of present illness.    All other systems reviewed and are negative.  EKGs/Labs/Other Studies Reviewed:    The following studies were reviewed today: I reviewed records from the hospital.   Recent Labs: No results found for requested labs within last 365 days.  Recent Lipid Panel    Component Value Date/Time   CHOL 216 (H) 04/07/2015 0125   TRIG 139 04/07/2015 0125   HDL 56 04/07/2015 0125   CHOLHDL 3.9 04/07/2015 0125   VLDL 28 04/07/2015 0125   LDLCALC 132 (H) 04/07/2015 0125    Physical Exam:    VS:  BP 106/68   Pulse 80   Ht 5\' 6"  (1.676 m)   Wt (!) 304 lb (137.9 kg)   LMP 02/16/2012   SpO2 93%   BMI 49.07 kg/m     Wt Readings from Last 3 Encounters:  07/13/23 (!) 304 lb (137.9 kg)  11/25/22 (!) 312 lb 12.8 oz (141.9 kg)  06/23/22 (!) 305 lb 12.8 oz (138.7 kg)     GEN: Patient is in no acute distress HEENT: Normal NECK: No JVD; No carotid bruits LYMPHATICS: No lymphadenopathy CARDIAC: Hear sounds regular, 2/6 systolic murmur at the apex. RESPIRATORY:  Clear to auscultation without rales, wheezing or rhonchi  ABDOMEN: Soft, non-tender, non-distended MUSCULOSKELETAL:  No edema; No deformity  SKIN: Warm and dry NEUROLOGIC:  Alert and oriented x 3 PSYCHIATRIC:  Normal affect   Signed, Tonya Balzarine, MD  07/13/2023 3:31 PM    [facility]

## 2023-08-01 ENCOUNTER — Telehealth: Payer: Self-pay | Admitting: *Deleted

## 2023-08-01 NOTE — Telephone Encounter (Signed)
 Tried to call pt to give instructions for MPI study. After numerous rings received message stating call could not be completed. My chart is still pending.

## 2023-08-08 ENCOUNTER — Ambulatory Visit: Attending: Cardiology

## 2023-08-08 DIAGNOSIS — R079 Chest pain, unspecified: Secondary | ICD-10-CM

## 2023-08-08 MED ORDER — TECHNETIUM TC 99M TETROFOSMIN IV KIT
32.9000 | PACK | Freq: Once | INTRAVENOUS | Status: AC | PRN
  Administered 2023-08-08: 32.9 via INTRAVENOUS

## 2023-08-08 MED ORDER — REGADENOSON 0.4 MG/5ML IV SOLN
0.4000 mg | Freq: Once | INTRAVENOUS | Status: AC
  Administered 2023-08-08: 0.4 mg via INTRAVENOUS

## 2023-08-09 ENCOUNTER — Ambulatory Visit: Attending: Cardiology

## 2023-08-09 MED ORDER — TECHNETIUM TC 99M TETROFOSMIN IV KIT
32.2000 | PACK | Freq: Once | INTRAVENOUS | Status: AC | PRN
  Administered 2023-08-09: 32.2 via INTRAVENOUS

## 2023-08-10 LAB — MYOCARDIAL PERFUSION IMAGING
LV dias vol: 81 mL (ref 46–106)
LV sys vol: 21 mL
Nuc Stress EF: 75 %
Peak HR: 87 {beats}/min
Rest HR: 73 {beats}/min
Rest Nuclear Isotope Dose: 32.2 mCi
SDS: 7
SRS: 5
SSS: 12
ST Depression (mm): 0 mm
Stress Nuclear Isotope Dose: 32.9 mCi
TID: 1.13

## 2023-08-13 ENCOUNTER — Emergency Department (HOSPITAL_COMMUNITY): Admission: EM | Admit: 2023-08-13 | Discharge: 2023-08-13 | Disposition: A | Discharge status: Home / Self Care

## 2023-08-13 ENCOUNTER — Other Ambulatory Visit: Payer: Self-pay

## 2023-08-13 ENCOUNTER — Encounter (HOSPITAL_COMMUNITY): Payer: Self-pay

## 2023-08-13 ENCOUNTER — Emergency Department (HOSPITAL_COMMUNITY)

## 2023-08-13 DIAGNOSIS — Z79899 Other long term (current) drug therapy: Secondary | ICD-10-CM | POA: Insufficient documentation

## 2023-08-13 DIAGNOSIS — M25552 Pain in left hip: Secondary | ICD-10-CM | POA: Insufficient documentation

## 2023-08-13 MED ORDER — NAPROXEN 500 MG PO TABS: 500.0000 mg | ORAL_TABLET | Freq: Two times a day (BID) | ORAL | 0 refills | Status: AC

## 2023-08-13 MED ORDER — KETOROLAC TROMETHAMINE 30 MG/ML IJ SOLN
30.0000 mg | Freq: Once | INTRAMUSCULAR | Status: AC
  Administered 2023-08-13: 30 mg via INTRAMUSCULAR
  Filled 2023-08-13: qty 1

## 2023-08-13 MED ORDER — CYCLOBENZAPRINE HCL 10 MG PO TABS: 10.0000 mg | ORAL_TABLET | Freq: Two times a day (BID) | ORAL | 0 refills | Status: AC | PRN

## 2023-08-13 NOTE — ED Triage Notes (Signed)
 Pt arrived POV from church c/o left hip pain and spasms. Pt states she fell about 4 weeks ago and she had to get a shot in her hip it felt better but started back hurting this morning and has gradually gotten worse through church.

## 2023-08-13 NOTE — Discharge Instructions (Signed)
 You were seen today for left hip pain post fall that happened 1 month ago.  Your x-ray did not show any signs of acute injury or fracture.  I am sending you home with Flexeril  and naproxen .  You can take these however note that the Flexeril  can cause drowsiness and recommend you do not take this while operating any vehicle or, heavy machinery, swimming or any other activity requiring concentration.  Please take Naprosyn , 500mg  by mouth twice daily as needed for pain - this in an antiinflammatory medicine (NSAID) and is similar to ibuprofen - many people feel that it is stronger than ibuprofen and it is easier to take since it is a smaller pill.  Please use this only for 1 week - if your pain persists, you will need to follow up with your doctor in the office for ongoing guidance and pain control.     You can also take Tylenol  for additional pain relief.  Recommend you follow-up with the PCP if you continue to experience pain and medications are not relieving the pain.   Return to the ED for any new or worsening symptoms

## 2023-08-13 NOTE — ED Provider Notes (Signed)
 [place] EMERGENCY DEPARTMENT AT [facility] Provider Note   CSN: [id number] Arrival date & time: 08/13/23  1307     History  Chief Complaint  Patient presents with   Hip Pain    Tonya Quinn is a 58 y.o. female.  Patient complains of pain in her left hip.  Patient reports she slipped and fell 4 weeks ago.  Patient reports she has been having pain in her hip since the time of the fall.  Patient saw her primary care physician and they gave her a shot of medicine in the office that helped.  Patient is unsure what the medication was but states that her doctor frequently gives her shots of this medication.  Patient reports that she has been able to walk.  Patient reports the pain became worse today while sitting in church.  Patient denies any new injury.  She denies any pain in her back.  The history is provided by the patient. No language interpreter was used.  Hip Pain       Home Medications Prior to Admission medications   Medication Sig Start Date End Date Taking? Authorizing Provider  acetaminophen  (TYLENOL ) 500 MG tablet Take 500 mg by mouth daily as needed (pain).    [provider]  ALPRAZolam  (XANAX ) 0.25 MG tablet Take 0.25 mg by mouth at bedtime as needed for anxiety.    [provider]  amLODipine (NORVASC) 10 MG tablet Take 5 tablets by mouth daily.    [provider]  celecoxib (CELEBREX) 200 MG capsule Take 200 mg by mouth daily.    [provider]  Cholecalciferol  (VITAMIN D3 PO) Take 1 capsule by mouth 3 (three) times a week.    [provider]  Cyanocobalamin (VITAMIN B-12 PO) Take 1 capsule by mouth 3 (three) times a week.    [provider]  diclofenac  Sodium (VOLTAREN ) 1 % GEL Apply 1 Application topically as needed (pain).    [provider]  JARDIANCE  25 MG TABS tablet Take 25 mg by mouth daily. 04/14/21   [provider]  losartan  (COZAAR ) 100 MG tablet Take 100 mg by mouth  daily.    [provider]  nebivolol (BYSTOLIC) 10 MG tablet Take 1 tablet by mouth daily.    [provider]  pantoprazole (PROTONIX) 40 MG tablet Take 40 mg by mouth daily as needed (acid reflux). 09/13/21   [provider]  Polyethylene Glycol 3350 (MIRALAX PO) Take 17 g by mouth 2 (two) times daily as needed (constipation).    [provider]  TOUJEO MAX SOLOSTAR 300 UNIT/ML Solostar Pen Inject 35 Units into the skin daily. 07/13/23   [provider]      Allergies    Hydrochlorothiazide, Topiramate, Insulin  lispro, Fluoxetine, Lisinopril , Sertraline, Insulin  glargine, and Metformin  and related    Review of Systems   Review of Systems  All other systems reviewed and are negative.   Physical Exam Updated Vital Signs BP 121/86 (BP Location: Left Arm)   Pulse 82   Temp 98.6 F (37 C) (Oral)   Resp 18   Ht 5\' 6"  (1.676 m)   Wt (!) 140.2 kg   LMP 02/16/2012   SpO2 96%   BMI 49.87 kg/m  Physical Exam Vitals and nursing note reviewed.  Constitutional:      Appearance: She is well-developed.  HENT:     Head: Normocephalic.  Cardiovascular:     Rate and Rhythm: Normal rate.  Pulmonary:  Effort: Pulmonary effort is normal.  Abdominal:     General: There is no distension.  Musculoskeletal:        General: Tenderness present. Normal range of motion.     Comments: Tender left hip pain with range of motion.  Neurovascular neurosensory intact  Skin:    General: Skin is warm.  Neurological:     General: No focal deficit present.     Mental Status: She is alert and oriented to person, place, and time.     ED Results / Procedures / Treatments   Labs (all labs ordered are listed, but only abnormal results are displayed) Labs Reviewed - No data to display  EKG None  Radiology No results found.  Procedures Procedures    Medications Ordered in ED Medications  ketorolac  (TORADOL ) 30 MG/ML injection 30 mg (30 mg Intramuscular  Given 08/13/23 1404)    ED Course/ Medical Decision Making/ A&P Clinical Course as of 08/13/23 1529  Sun Aug 13, 2023  1522 4 weeks of L hip pain post injury, mechanical fall. Awaiting XR read. Anticipate sending home.  [CB]    Clinical Course User Index [CB] Hayes Lipps, PA-C                                 Medical Decision Making Patient complains of pain in her left hip.  Patient reports that she fell 4 weeks ago she would like to have an x-ray to make sure that she did not break anything.  Amount and/or Complexity of Data Reviewed Radiology: ordered.  Risk Prescription drug management.           Final Clinical Impression(s) / ED Diagnoses Final diagnoses:  Left hip pain    Rx / DC Orders ED Discharge Orders     None         Daleen Steinhaus K, PA-C 08/13/23 1530    Carin Charleston, MD 08/14/23 1535

## 2023-08-13 NOTE — ED Provider Notes (Signed)
  Physical Exam  BP 121/86 (BP Location: Left Arm)   Pulse 82   Temp 98.6 F (37 C) (Oral)   Resp 18   Ht 5\' 6"  (1.676 m)   Wt (!) 140.2 kg   LMP 02/16/2012   SpO2 96%   BMI 49.87 kg/m   Physical Exam Vitals and nursing note reviewed.  Constitutional:      General: She is not in acute distress.    Appearance: Normal appearance. She is not ill-appearing or diaphoretic.  HENT:     Head: Normocephalic and atraumatic.  Eyes:     General:        Right eye: No discharge.        Left eye: No discharge.     Extraocular Movements: Extraocular movements intact.     Conjunctiva/sclera: Conjunctivae normal.  Cardiovascular:     Rate and Rhythm: Normal rate and regular rhythm.  Pulmonary:     Effort: Pulmonary effort is normal. No respiratory distress.  Skin:    General: Skin is warm and dry.  Neurological:     General: No focal deficit present.     Mental Status: She is alert. Mental status is at baseline.     Motor: No weakness.     Gait: Gait normal.     Procedures  Procedures  ED Course / MDM   Clinical Course as of 08/13/23 1606  Sun Aug 13, 2023  1522 4 weeks of L hip pain post injury, mechanical fall. Awaiting XR read. Anticipate sending home.  [CB]    Clinical Course User Index [CB] Hayes Lipps, PA-C   Medical Decision Making Amount and/or Complexity of Data Reviewed Radiology: ordered.  Risk Prescription drug management.   Patient care handed over from [facility], [place].  At time of handoff, awaiting x-ray read to confirm no acute injuries.  Patient reported a mechanical fall 4 weeks ago where she fell onto left hip.  Notes that she saw PCP who provided a shot of medicine in office that helped.  Has been able to ambulate without difficulty.  Pain worse today when sitting in church.  At time of handoff, anticipating being able to be discharged home on Naprosyn .  On evaluation, patient notes that she is wishing to be discharged on Flexeril  and have  naproxen  sent in.,  Alerted that this may increase drowsiness.  Toradol  has significantly helped symptoms at this time.  Patient vital signs have remained stable throughout the course of patient's time in the ED. Low suspicion for any other emergent pathology at this time. I believe this patient is safe to be discharged. Provided strict return to ER precautions. Patient expressed agreement and understanding of plan. All questions were answered.     Hayes Lipps, [place] 08/13/23 1634    Dorenda Gandy, MD 08/13/23 [phone]

## 2023-08-14 ENCOUNTER — Ambulatory Visit: Payer: Self-pay | Admitting: Cardiology

## 2023-08-15 NOTE — Telephone Encounter (Signed)
 "  Call cannot be completed as dialed"

## 2023-08-15 NOTE — Telephone Encounter (Signed)
-----   Message from Micael Adas Revankar sent at 08/14/2023  9:47 PM EDT ----- Ecasa and NTG. Please get CT FFR. Cc pcp Rajan R Revankar, MD 08/14/2023 9:47 PM

## 2023-08-17 MED ORDER — NITROGLYCERIN 0.4 MG SL SUBL: 0.4000 mg | SUBLINGUAL_TABLET | SUBLINGUAL | 6 refills | Status: AC | PRN

## 2023-08-17 MED ORDER — ASPIRIN 81 MG PO TBEC: 81.0000 mg | DELAYED_RELEASE_TABLET | Freq: Every day | ORAL | 3 refills | Status: AC

## 2023-08-17 NOTE — Addendum Note (Signed)
 Addended by: Einar Grave on: 08/17/2023 05:06 PM   Modules accepted: Orders

## 2023-08-22 ENCOUNTER — Encounter: Payer: Self-pay | Admitting: Cardiology

## 2023-08-22 ENCOUNTER — Ambulatory Visit: Attending: Cardiology | Admitting: Cardiology

## 2023-08-22 ENCOUNTER — Ambulatory Visit: Admitting: Cardiology

## 2023-08-22 VITALS — BP 132/80 | HR 73 | Ht 66.0 in | Wt 306.6 lb

## 2023-08-22 DIAGNOSIS — I10 Essential (primary) hypertension: Secondary | ICD-10-CM | POA: Diagnosis not present

## 2023-08-22 DIAGNOSIS — Z79899 Other long term (current) drug therapy: Secondary | ICD-10-CM

## 2023-08-22 DIAGNOSIS — I2510 Atherosclerotic heart disease of native coronary artery without angina pectoris: Secondary | ICD-10-CM | POA: Diagnosis not present

## 2023-08-22 DIAGNOSIS — R9439 Abnormal result of other cardiovascular function study: Secondary | ICD-10-CM

## 2023-08-22 DIAGNOSIS — E782 Mixed hyperlipidemia: Secondary | ICD-10-CM

## 2023-08-22 MED ORDER — RIVAROXABAN 20 MG PO TABS
20.0000 mg | ORAL_TABLET | Freq: Every day | ORAL | 3 refills | Status: AC
Start: 2023-08-22 — End: ?

## 2023-08-22 NOTE — Progress Notes (Signed)
 Cardiology Office Note:    Date:  08/22/2023   ID:  Tonya Quinn, DOB [birth year], MRN [id number]  PCP:  Angelique Barer, MD  Cardiologist:  Nelia Balzarine, MD   Referring MD: Angelique Barer, MD    ASSESSMENT:    1. Abnormal stress test   2. Coronary artery calcification   3. Benign essential hypertension   4. Morbid obesity (HCC)   5. Mixed hyperlipidemia    PLAN:    In order of problems listed above:  Coronary artery calcification: Secondary prevention stressed with the patient.  Importance of compliance with diet medication stressed and she vocalized understanding.  She was advised to be active and walk on a regular basis. Paroxysmal atrial fibrillation: For the first time she is agreeable for anticoagulation.I discussed with the patient atrial fibrillation, disease process. Management and therapy including rate and rhythm control, anticoagulation benefits and potential risks were discussed extensively with the patient. Patient had multiple questions which were answered to patient's satisfaction. She was initiated on Xarelto  20 mg daily.  She will have a Chem-7 and CBC today.  Will do a follow-up in 2 weeks.  She was advised to look for dark stools or any blood loss issues. Essential hypertension: Blood pressure stable and diet was emphasized.  Lifestyle modification urged. Mixed dyslipidemia: On lipid-lowering medications followed by primary care.  Goal LDL must be less than 60. Diabetes Lantus and morbid obesity: Weight reduction stressed diet emphasized.  She tells me that her A1c is consistently greater than 8 and I cautioned her about this. Patient will be seen in follow-up appointment in 6 months or earlier if the patient has any concerns.    Medication Adjustments/Labs and Tests Ordered: Current medicines are reviewed at length with the patient today.  Concerns regarding medicines are outlined above.  Orders Placed This Encounter  Procedures   EKG 12-Lead   No orders of  the defined types were placed in this encounter.    No chief complaint on file.    History of Present Illness:    Tonya Quinn is a 58 y.o. female.  Patient has past medical history of coronary artery calcification, paroxysmal atrial fibrillation, essential hypertension, mixed dyslipidemia diabetes mellitus and morbid obesity.  Overall she leads a sedentary lifestyle.  She denies any chest pain orthopnea or PND.  At the time of my evaluation, the patient is alert awake oriented and in no distress.  Past Medical History:  Diagnosis Date   Acute asthma exacerbation 10/24/2021   Adult BMI 45.0-49.9 kg/sq m [facility])    AKI (acute kidney injury) (HCC) 10/25/2021   ALLERGIC RHINITIS 07/05/2006   Qualifier: Diagnosis of  By: Beverlee Bucco  MD, Adlih     Allergic rhinitis 07/05/2006   Qualifier: Diagnosis of   By: Beverlee Bucco  MD, Adlih      IMO SNOMED Dx Update Oct 2024     Anxiety 04/07/2015   Asthma    Atrial fibrillation (HCC)    Atrial flutter (HCC) 01/02/2015   BACK PAIN 07/05/2006   Qualifier: Diagnosis of  By: Beverlee Bucco  MD, Adlih     Backache 07/05/2006   Qualifier: Diagnosis of   By: Beverlee Bucco  MD, Adlih      IMO SNOMED Dx Update Oct 2024     Benign essential hypertension    Bronchitis 11/24/2014   Cervical spine pain 07/11/2019   Chest pain of uncertain etiology 08/30/2021   Chronic fatigue 08/30/2021   Chronic midline thoracic back pain 07/11/2019  Common cold 11/04/2014   Constipation by delayed colonic transit 09/10/2014   Coronary artery calcification 06/23/2022   Current use of insulin  (HCC) 10/16/2018   DDD (degenerative disc disease), lumbar    Degeneration of intervertebral disc of lumbar region 07/13/2015   Degenerative disc disease, 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   Diabetes mellitus without complication (HCC) 04/07/2015   Diabetic neuropathy associated with type 2 diabetes mellitus (HCC) 09/12/2017   Diabetic polyneuropathy associated with type 2 diabetes mellitus  (HCC) 10/23/2018   Dyspnea, unspecified 01/02/2015   Dysuria 07/05/2006   Qualifier: Diagnosis of  By: Alean Amen     Elevated lactic acid level 01/02/2015   Essential hypertension    Excessive sleepiness 11/12/2013   Fibroids 03/01/2012   Fibromyalgia 11/12/2013   Generalized anxiety disorder 09/19/2014   Generalized osteoarthritis of multiple sites    GERD (gastroesophageal reflux disease)    Heart palpitations 01/02/2015   HLD (hyperlipidemia)    Hot flash, menopausal 09/24/2015   Formatting of this note might be different from the original. Pt with multiple complaints that have been ongoing since prior to her hyst.  LA[facility]BSO in 2013 at [facility].  C/o bloating, breast tenderness, mood changes, pelvic pain. Check labs as below to eval for menopause vs ? Ovarian remnant.  Imaging reviewed as far back as 2015. Benign.  RTC 4wks for follow up. Again discussed that her symptoms a   Hyperglycemia 01/02/2015   Hyperlipidemia associated with type 2 diabetes mellitus (HCC) 12/27/2018   Hyperopia of both eyes with astigmatism and presbyopia 10/16/2018   Hypertension    Hypertension associated with type 2 diabetes mellitus (HCC) 07/13/2015   Hypokalemia    Hypomagnesemia    Hypothyroidism    Left-sided weakness 04/07/2015   Long term current use of oral hypoglycemic drug 10/16/2018   LOW BACK PAIN, CHRONIC 07/05/2006   Qualifier: Diagnosis of  By: Beverlee Bucco  MD, Adlih     Lower extremity edema    Major depressive disorder, recurrent episode, moderate (HCC) 11/27/2012   Microalbuminuria due to type 2 diabetes mellitus (HCC) 07/14/2018   Morbid obesity (HCC)    Nuclear sclerotic cataract of both eyes 10/16/2018   Obesity    PAF (paroxysmal atrial fibrillation) (HCC) 08/30/2021   PMS (premenstrual syndrome) 12/28/2017   Polyarthralgia 08/05/2021   Postmenopausal bleeding 12/28/2017   Sepsis (HCC) 01/02/2015   Sleep apnea    Status post hysterectomy 09/30/2019   SVT  (supraventricular tachycardia) (HCC) 01/02/2015   Type 2 diabetes mellitus with diabetic neuropathy, with long-term current use of insulin  (HCC) 12/27/2018   Type 2 diabetes mellitus with obesity (HCC)    Uncontrolled type 2 diabetes mellitus with hyperglycemia (HCC) 04/07/2015   Formatting of this note might be different from the original. Uncontrolled. A1c 7/17- 10.9   Uninsured medical expenses 09/10/2014   Vaginal dryness, menopausal 07/13/2015   Formatting of this note might be different from the original. Some atrophy present. She desires oral estrogen, but will not rx given her multiple comorbidities. Start vaginal premarin RTC 3 months for f/u.   Vitreous floater, bilateral 10/16/2018   Weakness 04/08/2015    Past Surgical History:  Procedure Laterality Date   ABDOMINAL HYSTERECTOMY     CYSTOSCOPY  02/29/2012   Procedure: CYSTOSCOPY;  Surgeon: Cyd Dowse, MD;  Location: [facility];  Service: Gynecology;  Laterality: N/A;   LAPAROSCOPIC ASSISTED VAGINAL HYSTERECTOMY  02/29/2012   Procedure: LAPAROSCOPIC ASSISTED VAGINAL HYSTERECTOMY;  Surgeon: Cyd Dowse, MD;  Location: [facility];  Service: Gynecology;  Laterality:  N/A;   SALPINGOOPHORECTOMY  02/29/2012   Procedure: SALPINGO OOPHORECTOMY;  Surgeon: Cyd Dowse, MD;  Location: [facility];  Service: Gynecology;  Laterality: Bilateral;   uterine ablation     VEIN SURGERY      Current Medications: Current Meds  Medication Sig   acetaminophen  (TYLENOL ) 500 MG tablet Take 500 mg by mouth daily as needed (pain).   ALPRAZolam  (XANAX ) 0.25 MG tablet Take 0.25 mg by mouth at bedtime as needed for anxiety.   amLODipine (NORVASC) 10 MG tablet Take 5 tablets by mouth daily.   aspirin  EC 81 MG tablet Take 1 tablet (81 mg total) by mouth daily. Swallow whole.   celecoxib (CELEBREX) 200 MG capsule Take 200 mg by mouth daily.   Cholecalciferol  (VITAMIN D3 PO) Take 1 capsule by mouth 3 (three) times a week.   Cyanocobalamin (VITAMIN B-12 PO)  Take 1 capsule by mouth 3 (three) times a week.   cyclobenzaprine  (FLEXERIL ) 10 MG tablet Take 1 tablet (10 mg total) by mouth 2 (two) times daily as needed for muscle spasms.   diclofenac  Sodium (VOLTAREN ) 1 % GEL Apply 1 Application topically as needed (pain).   JARDIANCE  25 MG TABS tablet Take 25 mg by mouth daily.   losartan  (COZAAR ) 100 MG tablet Take 100 mg by mouth daily.   naproxen  (NAPROSYN ) 500 MG tablet Take 1 tablet (500 mg total) by mouth 2 (two) times daily.   nebivolol (BYSTOLIC) 10 MG tablet Take 1 tablet by mouth daily.   nitroGLYCERIN  (NITROSTAT ) 0.4 MG SL tablet Place 1 tablet (0.4 mg total) under the tongue every 5 (five) minutes as needed.   pantoprazole (PROTONIX) 40 MG tablet Take 40 mg by mouth daily as needed (acid reflux).   Polyethylene Glycol 3350 (MIRALAX PO) Take 17 g by mouth 2 (two) times daily as needed (constipation).   TOUJEO MAX SOLOSTAR 300 UNIT/ML Solostar Pen Inject 35 Units into the skin daily.     Allergies:   Hydrochlorothiazide, Topiramate, Insulin  lispro, Fluoxetine, Lisinopril , Sertraline, Insulin  glargine, and Metformin  and related   Social History   Socioeconomic History   Marital status: Widowed    Spouse name: Not on file   Number of children: Not on file   Years of education: Not on file   Highest education level: Not on file  Occupational History   Not on file  Tobacco Use   Smoking status: Never   Smokeless tobacco: Never  Vaping Use   Vaping status: Never Used  Substance and Sexual Activity   Alcohol use: No   Drug use: No   Sexual activity: Not on file  Other Topics Concern   Not on file  Social History Narrative   Not on file   Social Drivers of Health   Financial Resource Strain: Low Risk  (07/10/2023)   Received [facility]   Overall Financial Resource Strain (CARDIA)    Difficulty of Paying Living Expenses: Not very hard  Food Insecurity: No Food Insecurity (07/10/2023)   Received from [facility]   Hunger  Vital Sign    Worried About Running Out of Food in the Last Year: Never true    Ran Out of Food in the Last Year: Never true  Transportation Needs: No Transportation Needs (07/10/2023)   Received from [facility] - Transportation    Lack of Transportation (Medical): No    Lack of Transportation (Non-Medical): No  Physical Activity: Unknown (07/10/2023)   [facility] from [facility]   Exercise  Vital Sign    Days of Exercise per Week: 0 days    Minutes of Exercise per Session: Not on file  Stress: No Stress Concern Present (07/10/2023)   Received from [facility] - Occupational Stress Questionnaire    Feeling of Stress : Only a little  Social Connections: Socially Integrated (07/10/2023)   Received from [facility]   Social Network    How would you rate your social network (family, work, friends)?: Good participation with social networks     Family History: The patient's family history includes Diabetes in her brother, maternal grandmother, and paternal grandmother; Heart disease in her maternal grandmother; Hypertension in her brother, maternal grandmother, and paternal grandmother.  ROS:   Please see the history of present illness.    All other systems reviewed and are negative.  EKGs/Labs/Other Studies Reviewed:    The following studies were reviewed today: .Aaron AasEKG Interpretation Date/Time:  Tuesday August 22 2023 08:16:18 EDT Ventricular Rate:  73 PR Interval:  162 QRS Duration:  82 QT Interval:  438 QTC Calculation: 482 R Axis:   -10  Text Interpretation: Normal sinus rhythm Prolonged QT When compared with ECG of 25-Nov-2022 10:19, No significant change was found Confirmed by Hillis Lu [phone]) on 08/22/2023 8:27:46 AM     Recent Labs: No results found for requested labs within last 365 days.  Recent Lipid Panel    Component Value Date/Time   CHOL 216 (H) 04/07/2015 0125   TRIG 139 04/07/2015 0125   HDL 56  04/07/2015 0125   CHOLHDL 3.9 04/07/2015 0125   VLDL 28 04/07/2015 0125   LDLCALC 132 (H) 04/07/2015 0125    Physical Exam:    VS:  BP 132/80   Pulse 73   Ht 5\' 6"  (1.676 m)   Wt (!) 306 lb 9.6 oz (139.1 kg)   LMP 02/16/2012   SpO2 94%   BMI 49.49 kg/m     Wt Readings from Last 3 Encounters:  08/22/23 (!) 306 lb 9.6 oz (139.1 kg)  08/13/23 (!) 309 lb (140.2 kg)  08/08/23 (!) 304 lb (137.9 kg)     GEN: Patient is in no acute distress HEENT: Normal NECK: No JVD; No carotid bruits LYMPHATICS: No lymphadenopathy CARDIAC: Hear sounds regular, 2/6 systolic murmur at the apex. RESPIRATORY:  Clear to auscultation without rales, wheezing or rhonchi  ABDOMEN: Soft, non-tender, non-distended MUSCULOSKELETAL:  No edema; No deformity  SKIN: Warm and dry NEUROLOGIC:  Alert and oriented x 3 PSYCHIATRIC:  Normal affect   Signed, Nelia Balzarine, MD  08/22/2023 8:28 AM    [facility]

## 2023-08-22 NOTE — Patient Instructions (Signed)
 Medication Instructions:  Your physician has recommended you make the following change in your medication:  START: Xarelto  20 mg once daily. Please take with your largest meal of the day.  *If you need a refill on your cardiac medications before your next appointment, please call your pharmacy*  Lab Work: Today: CMET, CBC In 2 weeks: Fecal occult blood test If you have labs (blood work) drawn today and your tests are completely normal, you will receive your results only by: MyChart Message (if you have MyChart) OR A paper copy in the mail If you have any lab test that is abnormal or we need to change your treatment, we will call you to review the results.  Follow-Up: At [facility], you and your health needs are our priority.  As part of our continuing mission to provide you with exceptional heart care, our providers are all part of one team.  This team includes your primary Cardiologist (physician) and Advanced Practice Providers or APPs (Physician Assistants and Nurse Practitioners) who all work together to provide you with the care you need, when you need it.  Your next appointment:   9 month(s)  Provider:   Hillis Lu, MD

## 2023-08-23 ENCOUNTER — Ambulatory Visit: Payer: Self-pay | Admitting: Cardiology

## 2023-08-23 LAB — CBC WITH DIFFERENTIAL/PLATELET
Basophils Absolute: 0 10*3/uL (ref 0.0–0.2)
Basos: 0 %
EOS (ABSOLUTE): 0.1 10*3/uL (ref 0.0–0.4)
Eos: 2 %
Hematocrit: 42.1 % (ref 34.0–46.6)
Hemoglobin: 13.2 g/dL (ref 11.1–15.9)
Immature Grans (Abs): 0 10*3/uL (ref 0.0–0.1)
Immature Granulocytes: 0 %
Lymphocytes Absolute: 1.4 10*3/uL (ref 0.7–3.1)
Lymphs: 25 %
MCH: 27.2 pg (ref 26.6–33.0)
MCHC: 31.4 g/dL — ABNORMAL LOW (ref 31.5–35.7)
MCV: 87 fL (ref 79–97)
Monocytes Absolute: 0.4 10*3/uL (ref 0.1–0.9)
Monocytes: 7 %
Neutrophils Absolute: 3.7 10*3/uL (ref 1.4–7.0)
Neutrophils: 65 %
Platelets: 178 10*3/uL (ref 150–450)
RBC: 4.85 x10E6/uL (ref 3.77–5.28)
RDW: 13.5 % (ref 11.7–15.4)
WBC: 5.7 10*3/uL (ref 3.4–10.8)

## 2023-08-23 LAB — COMPREHENSIVE METABOLIC PANEL WITH GFR
ALT: 15 IU/L (ref 0–32)
AST: 11 IU/L (ref 0–40)
Albumin: 3.8 g/dL (ref 3.8–4.9)
Alkaline Phosphatase: 138 IU/L — ABNORMAL HIGH (ref 44–121)
BUN/Creatinine Ratio: 24 — ABNORMAL HIGH (ref 9–23)
BUN: 20 mg/dL (ref 6–24)
Bilirubin Total: 0.3 mg/dL (ref 0.0–1.2)
CO2: 22 mmol/L (ref 20–29)
Calcium: 9.1 mg/dL (ref 8.7–10.2)
Chloride: 106 mmol/L (ref 96–106)
Creatinine, Ser: 0.84 mg/dL (ref 0.57–1.00)
Globulin, Total: 3.1 g/dL (ref 1.5–4.5)
Glucose: 224 mg/dL — ABNORMAL HIGH (ref 70–99)
Potassium: 4.3 mmol/L (ref 3.5–5.2)
Sodium: 143 mmol/L (ref 134–144)
Total Protein: 6.9 g/dL (ref 6.0–8.5)
eGFR: 80 mL/min/{1.73_m2} (ref >59)

## 2023-10-10 ENCOUNTER — Telehealth: Payer: Self-pay | Admitting: Cardiology

## 2023-10-10 NOTE — Telephone Encounter (Signed)
 Pt c/o medication issue:  1. Name of Medication: aspirin  EC 81 MG tablet    rivaroxaban  (XARELTO ) 20 MG TABS tablet    2. How are you currently taking this medication (dosage and times per day)?    3. Are you having a reaction (difficulty breathing--STAT)? no  4. What is your medication issue? Patient states that she stop the medication two weeks ago. Stated that she was bleeding too much. Calling to see what else can be prescribe. Please advise

## 2023-10-10 NOTE — Telephone Encounter (Signed)
 Pt states that when she started the Xarelto  and aspirin  she began having nosebleeds, vaginal bleeding and blood in her stool. Pt states bleeding resolved once she quit the medication and did not see anyone while she had the bleeding. Please advise

## 2023-10-11 NOTE — Telephone Encounter (Signed)
 Recommendations reviewed with pt as per Dr. Posey note.  Pt states that she saw Dr. Jama for sx. Pt states she will not take the blood thinner. Pt has been advised of the risk of a stroke with her hx of afib ans states I am aware but I am not taking it. Pt verbalized understanding and had no additional questions.

## 2024-01-09 ENCOUNTER — Telehealth: Payer: Self-pay

## 2024-01-09 NOTE — Telephone Encounter (Signed)
    Primary Cardiologist: None  Chart reviewed as part of pre-operative protocol coverage. Simple dental extractions and root canals are considered low risk procedures per guidelines and generally do not require any specific cardiac clearance. It is also generally accepted that for simple extractions, root canals, and dental cleanings, there is no need to interrupt blood thinner therapy.   SBE prophylaxis is not required for the patient.  I will route this recommendation to the requesting party via Epic fax function and remove from pre-op pool.  Please call with questions.  Lum LITTIE Louis, NP 01/09/2024, 12:22 PM

## 2024-01-09 NOTE — Telephone Encounter (Signed)
   Pre-operative Risk Assessment    Patient Name: Tonya Quinn  DOB: [birth year] MRN: [id number]   Date of last office visit: 08/22/23 Date of next office visit: N/A  Request for Surgical Clearance    Procedure:  root canal treatment using dental anestetic with epi  Date of Surgery:  Clearance TBD                                Surgeon:   Surgeon's Group or Practice Name:  [facility] Phone number:  [phone] Fax number:  [phone]   Type of Clearance Requested:   - Medical    Type of Anesthesia:  dental anestetic with epi   Additional requests/questions:  Does this patient need antibiotics?  Signed, Annabella LITTIE Sayres   01/09/2024, 9:31 AM
